# Patient Record
Sex: Male | Born: 1960 | Race: White | Hispanic: No | Marital: Married | Smoking: Former smoker
Health system: Southern US, Community
[De-identification: ages and names within clinical notes are randomized; demographics above are authoritative.]

## PROBLEM LIST (undated history)

## (undated) ENCOUNTER — Emergency Department: Discharge status: Absent without leave | Payer: BC Managed Care – PPO

## (undated) DIAGNOSIS — N200 Calculus of kidney: Secondary | ICD-10-CM

## (undated) DIAGNOSIS — I2510 Atherosclerotic heart disease of native coronary artery without angina pectoris: Secondary | ICD-10-CM

## (undated) DIAGNOSIS — H8110 Benign paroxysmal vertigo, unspecified ear: Secondary | ICD-10-CM

## (undated) DIAGNOSIS — M47816 Spondylosis without myelopathy or radiculopathy, lumbar region: Secondary | ICD-10-CM

## (undated) DIAGNOSIS — E785 Hyperlipidemia, unspecified: Secondary | ICD-10-CM

## (undated) DIAGNOSIS — N529 Male erectile dysfunction, unspecified: Secondary | ICD-10-CM

## (undated) DIAGNOSIS — Z955 Presence of coronary angioplasty implant and graft: Secondary | ICD-10-CM

## (undated) DIAGNOSIS — E349 Endocrine disorder, unspecified: Secondary | ICD-10-CM

## (undated) DIAGNOSIS — I639 Cerebral infarction, unspecified: Principal | ICD-10-CM

## (undated) DIAGNOSIS — N189 Chronic kidney disease, unspecified: Secondary | ICD-10-CM

## (undated) DIAGNOSIS — I10 Essential (primary) hypertension: Secondary | ICD-10-CM

## (undated) DIAGNOSIS — C801 Malignant (primary) neoplasm, unspecified: Secondary | ICD-10-CM

## (undated) DIAGNOSIS — M1990 Unspecified osteoarthritis, unspecified site: Secondary | ICD-10-CM

## (undated) DIAGNOSIS — Z87442 Personal history of urinary calculi: Secondary | ICD-10-CM

## (undated) DIAGNOSIS — E119 Type 2 diabetes mellitus without complications: Secondary | ICD-10-CM

## (undated) DIAGNOSIS — J45909 Unspecified asthma, uncomplicated: Secondary | ICD-10-CM

## (undated) DIAGNOSIS — K219 Gastro-esophageal reflux disease without esophagitis: Secondary | ICD-10-CM

## (undated) DIAGNOSIS — R011 Cardiac murmur, unspecified: Secondary | ICD-10-CM

## (undated) DIAGNOSIS — K5790 Diverticulosis of intestine, part unspecified, without perforation or abscess without bleeding: Secondary | ICD-10-CM

## (undated) DIAGNOSIS — M1712 Unilateral primary osteoarthritis, left knee: Secondary | ICD-10-CM

## (undated) DIAGNOSIS — I213 ST elevation (STEMI) myocardial infarction of unspecified site: Secondary | ICD-10-CM

## (undated) DIAGNOSIS — G4733 Obstructive sleep apnea (adult) (pediatric): Secondary | ICD-10-CM

## (undated) DIAGNOSIS — D649 Anemia, unspecified: Secondary | ICD-10-CM

## (undated) DIAGNOSIS — M2140 Flat foot [pes planus] (acquired), unspecified foot: Secondary | ICD-10-CM

## (undated) DIAGNOSIS — Z85528 Personal history of other malignant neoplasm of kidney: Secondary | ICD-10-CM

## (undated) DIAGNOSIS — N281 Cyst of kidney, acquired: Secondary | ICD-10-CM

## (undated) DIAGNOSIS — Z7982 Long term (current) use of aspirin: Secondary | ICD-10-CM

## (undated) DIAGNOSIS — C649 Malignant neoplasm of unspecified kidney, except renal pelvis: Secondary | ICD-10-CM

## (undated) DIAGNOSIS — F32A Depression, unspecified: Secondary | ICD-10-CM

## (undated) DIAGNOSIS — Z8679 Personal history of other diseases of the circulatory system: Secondary | ICD-10-CM

## (undated) HISTORY — DX: Flat foot (pes planus) (acquired), unspecified foot: M21.40

## (undated) HISTORY — DX: Cerebral infarction, unspecified: I63.9

## (undated) HISTORY — DX: Endocrine disorder, unspecified: E34.9

## (undated) HISTORY — DX: Hyperlipidemia, unspecified: E78.5

## (undated) HISTORY — PX: JOINT REPLACEMENT: SHX530

## (undated) HISTORY — DX: Male erectile dysfunction, unspecified: N52.9

## (undated) HISTORY — DX: Atherosclerotic heart disease of native coronary artery without angina pectoris: I25.10

## (undated) HISTORY — DX: Diverticulosis of intestine, part unspecified, without perforation or abscess without bleeding: K57.90

## (undated) HISTORY — PX: VASECTOMY: SHX75

## (undated) HISTORY — DX: ST elevation (STEMI) myocardial infarction of unspecified site: I21.3

## (undated) HISTORY — DX: Cardiac murmur, unspecified: R01.1

## (undated) HISTORY — DX: Personal history of other malignant neoplasm of kidney: Z85.528

## (undated) HISTORY — PX: TONSILLECTOMY AND ADENOIDECTOMY: SUR1326

## (undated) HISTORY — PX: TESTICLE SURGERY: SHX794

## (undated) HISTORY — DX: Essential (primary) hypertension: I10

## (undated) HISTORY — PX: CARDIAC CATHETERIZATION: SHX172

## (undated) HISTORY — PX: COLON SURGERY: SHX602

---

## 2003-05-28 DIAGNOSIS — E785 Hyperlipidemia, unspecified: Secondary | ICD-10-CM | POA: Insufficient documentation

## 2003-05-29 ENCOUNTER — Encounter: Payer: Self-pay | Admitting: Family Medicine

## 2003-05-29 LAB — CONVERTED CEMR LAB
Blood Glucose, Fasting: 85 mg/dL
WBC, blood: 5.9 10*3/uL

## 2004-06-16 ENCOUNTER — Ambulatory Visit: Payer: Self-pay | Admitting: Family Medicine

## 2005-03-16 ENCOUNTER — Ambulatory Visit: Payer: Self-pay | Admitting: Family Medicine

## 2005-03-16 LAB — CONVERTED CEMR LAB
PSA: 0.44 ng/mL
TSH: 1.89 microintl units/mL

## 2005-03-23 ENCOUNTER — Ambulatory Visit (HOSPITAL_BASED_OUTPATIENT_CLINIC_OR_DEPARTMENT_OTHER): Admission: RE | Admit: 2005-03-23 | Discharge: 2005-03-23 | Payer: Self-pay | Admitting: Family Medicine

## 2005-04-02 ENCOUNTER — Ambulatory Visit: Payer: Self-pay | Admitting: Pulmonary Disease

## 2005-05-06 ENCOUNTER — Ambulatory Visit: Payer: Self-pay | Admitting: Family Medicine

## 2005-06-08 ENCOUNTER — Ambulatory Visit: Payer: Self-pay | Admitting: Family Medicine

## 2006-04-03 ENCOUNTER — Other Ambulatory Visit: Payer: Self-pay

## 2006-04-03 ENCOUNTER — Emergency Department: Payer: Self-pay | Admitting: Emergency Medicine

## 2006-05-06 ENCOUNTER — Ambulatory Visit: Payer: Self-pay | Admitting: Family Medicine

## 2007-10-27 ENCOUNTER — Ambulatory Visit: Payer: Self-pay | Admitting: Family Medicine

## 2007-10-27 DIAGNOSIS — N529 Male erectile dysfunction, unspecified: Secondary | ICD-10-CM | POA: Insufficient documentation

## 2007-11-16 ENCOUNTER — Encounter: Payer: Self-pay | Admitting: Family Medicine

## 2007-11-16 DIAGNOSIS — G4730 Sleep apnea, unspecified: Secondary | ICD-10-CM

## 2007-11-16 DIAGNOSIS — E349 Endocrine disorder, unspecified: Secondary | ICD-10-CM

## 2007-11-16 DIAGNOSIS — I10 Essential (primary) hypertension: Secondary | ICD-10-CM

## 2008-01-30 ENCOUNTER — Ambulatory Visit: Payer: Self-pay | Admitting: Family Medicine

## 2008-01-30 DIAGNOSIS — D485 Neoplasm of uncertain behavior of skin: Secondary | ICD-10-CM

## 2008-01-31 LAB — CONVERTED CEMR LAB
ALT: 29 units/L (ref 0–53)
AST: 24 units/L (ref 0–37)
Alkaline Phosphatase: 76 units/L (ref 39–117)
BUN: 19 mg/dL (ref 6–23)
Basophils Absolute: 0 10*3/uL (ref 0.0–0.1)
Basophils Relative: 0.6 % (ref 0.0–3.0)
CO2: 30 meq/L (ref 19–32)
Chloride: 105 meq/L (ref 96–112)
Creatinine, Ser: 1.2 mg/dL (ref 0.4–1.5)
Eosinophils Relative: 6.4 % — ABNORMAL HIGH (ref 0.0–5.0)
LDL Cholesterol: 123 mg/dL — ABNORMAL HIGH (ref 0–99)
Lymphocytes Relative: 25 % (ref 12.0–46.0)
Neutrophils Relative %: 59.6 % (ref 43.0–77.0)
PSA: 0.53 ng/mL (ref 0.10–4.00)
RBC: 5.17 M/uL (ref 4.22–5.81)
Total Bilirubin: 1.1 mg/dL (ref 0.3–1.2)
VLDL: 27 mg/dL (ref 0–40)
WBC: 6.7 10*3/uL (ref 4.5–10.5)

## 2008-07-17 ENCOUNTER — Ambulatory Visit: Payer: Self-pay | Admitting: Family Medicine

## 2008-08-02 ENCOUNTER — Telehealth: Payer: Self-pay | Admitting: Family Medicine

## 2008-08-03 ENCOUNTER — Ambulatory Visit: Payer: Self-pay | Admitting: Internal Medicine

## 2008-08-15 ENCOUNTER — Telehealth: Payer: Self-pay | Admitting: Internal Medicine

## 2008-08-16 ENCOUNTER — Ambulatory Visit: Payer: Self-pay | Admitting: Internal Medicine

## 2008-11-02 ENCOUNTER — Ambulatory Visit: Payer: Self-pay | Admitting: Family Medicine

## 2008-11-02 DIAGNOSIS — L03019 Cellulitis of unspecified finger: Secondary | ICD-10-CM

## 2009-03-25 ENCOUNTER — Encounter: Payer: Self-pay | Admitting: Family Medicine

## 2009-03-26 ENCOUNTER — Ambulatory Visit: Payer: Self-pay | Admitting: Family Medicine

## 2009-03-26 DIAGNOSIS — R079 Chest pain, unspecified: Secondary | ICD-10-CM | POA: Insufficient documentation

## 2009-05-07 ENCOUNTER — Ambulatory Visit: Payer: Self-pay

## 2009-05-07 ENCOUNTER — Ambulatory Visit: Payer: Self-pay | Admitting: Cardiology

## 2009-05-07 ENCOUNTER — Ambulatory Visit: Payer: Self-pay | Admitting: Family Medicine

## 2009-06-04 ENCOUNTER — Telehealth: Payer: Self-pay | Admitting: Family Medicine

## 2009-06-21 ENCOUNTER — Emergency Department: Payer: Self-pay | Admitting: Emergency Medicine

## 2009-06-22 ENCOUNTER — Emergency Department: Payer: Self-pay | Admitting: Emergency Medicine

## 2009-07-12 ENCOUNTER — Telehealth: Payer: Self-pay | Admitting: Family Medicine

## 2010-01-15 ENCOUNTER — Ambulatory Visit: Payer: Self-pay | Admitting: Family Medicine

## 2010-01-15 DIAGNOSIS — B360 Pityriasis versicolor: Secondary | ICD-10-CM | POA: Insufficient documentation

## 2010-01-15 DIAGNOSIS — H43399 Other vitreous opacities, unspecified eye: Secondary | ICD-10-CM

## 2010-01-20 LAB — CONVERTED CEMR LAB
ALT: 30 units/L (ref 0–53)
Alkaline Phosphatase: 76 units/L (ref 39–117)
Basophils Relative: 0.8 % (ref 0.0–3.0)
Bilirubin, Direct: 0.1 mg/dL (ref 0.0–0.3)
Calcium: 9.4 mg/dL (ref 8.4–10.5)
Eosinophils Relative: 6 % — ABNORMAL HIGH (ref 0.0–5.0)
GFR calc non Af Amer: 68.91 mL/min (ref >60)
Glucose, Bld: 98 mg/dL (ref 70–99)
HDL: 42.3 mg/dL (ref >39.00)
Lymphocytes Relative: 28.5 % (ref 12.0–46.0)
Monocytes Relative: 7.9 % (ref 3.0–12.0)
Neutrophils Relative %: 56.8 % (ref 43.0–77.0)
Phosphorus: 4.1 mg/dL (ref 2.3–4.6)
Platelets: 180 10*3/uL (ref 150.0–400.0)
Potassium: 4.3 meq/L (ref 3.5–5.1)
RBC: 4.9 M/uL (ref 4.22–5.81)
Sodium: 141 meq/L (ref 135–145)
Total Bilirubin: 0.6 mg/dL (ref 0.3–1.2)
Total CHOL/HDL Ratio: 5
Total Protein: 6.9 g/dL (ref 6.0–8.3)
VLDL: 88.2 mg/dL — ABNORMAL HIGH (ref 0.0–40.0)
WBC: 6.9 10*3/uL (ref 4.5–10.5)

## 2010-03-28 ENCOUNTER — Ambulatory Visit: Payer: Self-pay | Admitting: Family Medicine

## 2010-04-30 ENCOUNTER — Ambulatory Visit: Admit: 2010-04-30 | Payer: Self-pay | Admitting: Family Medicine

## 2010-05-01 ENCOUNTER — Encounter (INDEPENDENT_AMBULATORY_CARE_PROVIDER_SITE_OTHER): Payer: Self-pay | Admitting: *Deleted

## 2010-05-07 ENCOUNTER — Ambulatory Visit: Payer: Self-pay | Admitting: Family Medicine

## 2010-05-21 NOTE — Assessment & Plan Note (Signed)
Summary: SKIN PROBLEMS,CHECK BP/CLE   Vital Signs:  Patient profile:   50 year old male Height:      67 inches Weight:      261.25 pounds BMI:     41.07 Temp:     98.4 degrees F oral Pulse rate:   84 / minute Pulse rhythm:   regular BP sitting:   120 / 76  (left arm) Cuff size:   large  Vitals Entered By: Lewanda Rife LPN (January 15, 2010 3:00 PM) CC: Ck BP and skin rash upper torso   History of Present Illness: had flu shot today   bp is needing check/ refils very good  120/76  rash started over 6 months ago -- and seems to be spreading  itches around his neck  hypopigmented areas- not raised   wt is up significantly  walks for exercise- not really regularly  used to go to gym  is not eating right -  ? why  felt better when he had lost wt  not motivated to do it   sometimes eats when he is bored   also having eye problems in R eye -- a pc of something - follows his vision around (Not painful) will rub it and it comes right back overall comes and goes no loss of vision or redness or d/c    Allergies (verified): No Known Drug Allergies  Past History:  Past Medical History: Last updated: 08/19/2008 HTN testosterone def  sleep apnea  hyperlipidemia ED pes planus  family hx colon cancer   ortho -- [facility]  GI- [facility]  Past Surgical History: Last updated: 05/07/2009 tonsillectomy and adneoids :(as child) TESTICLLE SX:( AS CHILD) HEART MURMUR:  ( AS CHILD) ASTHMA : (AS CHILD) SLEEP STUDY : APNEA--CPAP:(03/2005) colonoscopy 4/10 - diverticulosis- re check 5 y (due to fam hx) 1/11 normal exercise stress test   Family History: Last updated: 03/26/2009 Father:   HEART PROBLEMS//  Mother: DECEASED AT 51 YOA ; + HTN: +MI at age 78 Siblings:  sister with colon cancer  CV: + FATHER; + MOTHER HBP: + FATHER; + MOTHER DM: NEGATIVE GOUT/ARTHRITIS: PROSTATE CANCER: NEGATIVE BREAST/OVARIAN/UTERINE CANCER: NEGATIVE COLON CANCER:  NEGATIVE DEPRESSION: ? 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 Eyes:  Complains of eye irritation and itching; denies blurring, red eye, and vision loss-1 eye. CV:  Denies chest pain or discomfort, lightheadness, and palpitations. Resp:  Denies cough, shortness of breath, and wheezing. GI:  Denies abdominal pain, change in bowel habits, and nausea. GU:  Denies urinary frequency. MS:  Denies joint pain. Derm:  Complains of itching and rash; denies lesion(s) and poor wound healing. Neuro:  Denies headaches, numbness, and tingling. Psych:  Denies anxiety and depression. Endo:  Denies cold intolerance, excessive thirst, excessive urination, and heat intolerance. Heme:  Denies abnormal bruising and bleeding.  Physical Exam  General:  overweight but generally well appearing  Head:  normocephalic, atraumatic, and no abnormalities observed.   Eyes:  vision grossly intact, pupils equal, pupils round, pupils reactive to light, and no injection.   Mouth:  pharynx pink and moist.   Neck:  supple with full rom and no masses or thyromegally,  no JVD or carotid bruit  Chest Wall:  No deformities, masses, tenderness or gynecomastia noted. Lungs:  Normal respiratory effort, chest expands symmetrically. Lungs are clear to auscultation, no crackles or wheezes. Heart:  Normal rate and regular rhythm. S1 and S2 normal without gallop, murmur, click, rub or other extra sounds. Abdomen:  Bowel sounds positive,abdomen soft and non-tender without masses, organomegaly or hernias noted. no renal bruits  Msk:  No deformity or scoliosis noted of thoracic or lumbar  spine.  no renal bruits  Pulses:  R and L carotid,radial,femoral,dorsalis pedis and posterior tibial pulses are full and equal bilaterally Extremities:  No clubbing, cyanosis, edema, or deformity noted with normal full range of motion of all joints.   Neurologic:  sensation intact to light touch, gait normal, and DTRs symmetrical and normal.   Skin:  rash over neck and upper body pink/ hypopigmented oval lesions very slt raised with mild scale no excoriation  some skin tags Cervical Nodes:  No lymphadenopathy noted Inguinal Nodes:  No significant adenopathy Psych:  normal affect, talkative and pleasant    Impression & Recommendations:  Problem # 1:  HYPERTENSION, CONTROLLED (ICD-401.1) Assessment Unchanged  well controlled with ace disc healthy diet (low simple sugar/ choose complex carbs/ low sat fat) diet and exercise in detail  needs to work on getting wt back down  lab today His updated medication list for this problem includes:    Lisinopril 10 Mg Tabs (Lisinopril) .Marland Kitchen... Take 1 tablet by mouth once a day  Orders: Venipuncture (04540) TLB-Lipid Panel (80061-LIPID) TLB-Renal Function Panel (80069-RENAL) TLB-CBC Platelet - w/Differential (85025-CBCD) TLB-Hepatic/Liver Function Pnl (80076-HEPATIC) TLB-TSH (Thyroid Stimulating Hormone) (84443-TSH) Prescription Created Electronically [phone])  BP today: 120/76 Prior BP: 147/79 (05/07/2009)  Prior 10 Yr Risk Heart Disease: 11 % (05/07/2009)  Labs Reviewed: K+: 4.5 (01/30/2008) Creat: : 1.2 (01/30/2008)   Chol: 190 (01/30/2008)   HDL: 39.5 (01/30/2008)   LDL: 123 (01/30/2008)   TG: 136 (01/30/2008)  Problem # 2:  HYPERLIPIDEMIA (ICD-272.4) Assessment: Unchanged  expect worse with poor diet rev low sat fat diet lab today and update Orders: Venipuncture (14782) TLB-Lipid Panel (80061-LIPID) TLB-Renal Function Panel (80069-RENAL) TLB-CBC Platelet - w/Differential (85025-CBCD) TLB-Hepatic/Liver Function Pnl  (80076-HEPATIC) TLB-TSH (Thyroid Stimulating Hormone) (95621-HYQ) Prescription Created Electronically [id number])  Labs Reviewed: SGOT: 24 (01/30/2008)   SGPT: 29 (01/30/2008)  Prior 10 Yr Risk Heart Disease: 11 % (05/07/2009)   HDL:39.5 (01/30/2008)  LDL:123 (01/30/2008)  Chol:190 (01/30/2008)  Trig:136 (01/30/2008)  Problem # 3:  TINEA VERSICOLOR (ICD-111.0) Assessment: New  on chest and neck and back will tx with ketoconazole shampoo and update if not imp  adv to keep area as dry as possible His updated medication list for this problem includes:    Nizoral 2 % Sham (Ketoconazole) .Marland Kitchen... Lather affected areas twice weekly - let sit 5 minutes and rinse  Orders: Prescription Created Electronically [phone])  Problem # 4:  EYE FLOATERS (ICD-379.24) Assessment: New new R eye floaters ref to opthy Orders: Ophthalmology Referral (Ophthalmology) Prescription Created Electronically [phone])  Complete Medication List: 1)  Lisinopril 10 Mg Tabs (Lisinopril) .... Take 1 tablet by mouth once a day 2)  Aspirin 81 Mg Tabs (Aspirin) .... Take 1 tablet by mouth once a day 3)  Ibuprofen 200 Mg Tabs (Ibuprofen) .... As needed but usually on a daily basis. 4)  Omeprazole 20 Mg Cpdr (Omeprazole) .Marland Kitchen.. 1 by mouth once daily in am as needed 5)  Levitra 20 Mg Tabs (Vardenafil hcl) .Marland Kitchen.. 1 by mouth  once daily as needed 6)  Nizoral 2 % Sham (Ketoconazole) .... Lather affected areas twice weekly - let sit 5 minutes and rinse  Other Orders: Admin 1st Vaccine (44010) Flu Vaccine 50yrs + (27253)  Patient Instructions: 1)  work on weight loss again  2)  you can raise your HDL (good cholesterol) by increasing exercise and eating omega 3 fatty acid supplement like fish oil or flax seed oil over the counter 3)  you can lower LDL (bad cholesterol) by limiting saturated fats in diet like red meat, fried foods, egg yolks, fatty breakfast meats, high fat dairy products and shellfish  4)  blood pressure is good- no  change in medicine  5)  labs today 6)  use the nizoral shampoo for rash and update me if not improved in 1 month  7)  we will do eye doctor ref at check out  Prescriptions: NIZORAL 2 % SHAM (KETOCONAZOLE) lather affected areas twice weekly - let sit 5 minutes and rinse  #1 medium x 1   Entered and Authorized by:   Judith Part MD   Signed by:   Judith Part MD on 01/15/2010   Method used:   Electronically to        [facility] [address] (retail)       [address], [address]       [place], [place]  [address]       Ph: [phone]       Fax: [phone]   RxID:   [id number] LISINOPRIL 10 MG  TABS (LISINOPRIL) Take 1 tablet by mouth once a day  #30 x 11   Entered and Authorized by:   Judith Part MD   Signed by:   Judith Part MD on 01/15/2010   Method used:   Electronically to        [facility] [address] (retail)       [address], [address]       [place], [place]  [address]       Ph: [phone]       Fax: [phone]   RxID:   [id number]   Current Allergies (reviewed today): No known allergies      Flu Vaccine Consent Questions     Do you have a history of severe allergic reactions to this vaccine? no    Any prior history of allergic reactions to egg and/or gelatin? no    Do you have a sensitivity to the preservative Thimersol? no    Do you have a past history of Guillan-Barre Syndrome? no    Do you currently have an acute febrile illness? no    Have you ever had a severe reaction to latex? no    Vaccine information given and explained to patient? yes    Are you currently pregnant? no    Lot Number:[id number]   Exp Date:10/18/2010   Site Given  Left Deltoid IMbflu Lewanda Rife LPN  January 15, 2010 3:07 PM

## 2010-05-21 NOTE — Progress Notes (Signed)
Summary: refill request for levitra  Phone Note Call from Patient   Caller: Spouse [phone] Call For: Jacob Marks Summary of Call: Pt is asking for levitra 20 mg's to be called to [facility] [address], he is requesting # 8 at a time.  This is no longer on med list. Initial call taken by: Lowella Petties CMA,  June 04, 2009 4:47 PM  Follow-up for Phone Call        px written on EMR for call in  Follow-up by: Jacob Marks,  June 04, 2009 5:36 PM  Additional Follow-up for Phone Call Additional follow up Details #1::        spouse advised and rx called in.Consuello Masse CMA  Additional Follow-up by: Benny Lennert CMA Duncan Dull),  June 05, 2009 10:50 AM    New/Updated Medications: LEVITRA 20 MG TABS (VARDENAFIL HCL) 1 by mouth once daily Prescriptions: LEVITRA 20 MG TABS (VARDENAFIL HCL) 1 by mouth once daily  #8 x 11   Entered and Authorized by:   Jacob Marks   Signed by:   Benny Lennert CMA (AAMA) on 06/05/2009   Method used:   Telephoned to ...       [facility] [address] ([facility]ail)       [address]       [place], [place]  [address]       Ph: [phone]       Fax: [phone]   RxID:   [id number]

## 2010-05-21 NOTE — Progress Notes (Signed)
Summary: prior Berkley Harvey is needed for omeprazole  Phone Note From Pharmacy   Caller: [phone] Summary of Call: Prior Berkley Harvey is needed for omeprazole, form is on your shelf. Initial call taken by: Lowella Petties CMA,  July 12, 2009 9:46 AM  Follow-up for Phone Call        form done and in nurse in box  Follow-up by: Judith Part MD,  July 12, 2009 11:59 AM  Additional Follow-up for Phone Call Additional follow up Details #1::        Form faxed to [facility] at [phone]. Additional Follow-up by: Linde Gillis CMA Duncan Dull),  July 12, 2009 12:44 PM        Appended Document: prior Berkley Harvey is needed for omeprazole Received PA Approval for Omeprazole.  Approved from 06/21/2009 until 04/19/2098.  Pharmacy and patient notified.

## 2010-05-22 NOTE — Letter (Signed)
Summary: [place] No Show Letter  [place] [facility]  [address] [place], [place] [address]   Phone: [phone]  Fax: [phone]    05/01/2010 MRN: [id number]  Jacob Marks [address] [place], [place]  [address]   Dear Mr. ALARIE,   Our records indicate that you missed your scheduled appointment with ____Laboratory_________________ on _1.11.2012__________.  Please contact this office to reschedule your appointment as soon as possible.  It is important that you keep your scheduled appointments with your physician, so we can provide you the best care possible.  Please be advised that there may be a charge for "no show" appointments.    Sincerely,   [place] [facility]

## 2010-07-31 ENCOUNTER — Encounter: Payer: Self-pay | Admitting: Family Medicine

## 2010-08-05 ENCOUNTER — Encounter: Payer: Self-pay | Admitting: Family Medicine

## 2010-08-05 ENCOUNTER — Ambulatory Visit (INDEPENDENT_AMBULATORY_CARE_PROVIDER_SITE_OTHER): Payer: BC Managed Care – PPO | Admitting: Family Medicine

## 2010-08-05 DIAGNOSIS — Z125 Encounter for screening for malignant neoplasm of prostate: Secondary | ICD-10-CM

## 2010-08-05 DIAGNOSIS — I10 Essential (primary) hypertension: Secondary | ICD-10-CM

## 2010-08-05 DIAGNOSIS — E291 Testicular hypofunction: Secondary | ICD-10-CM

## 2010-08-05 DIAGNOSIS — G4730 Sleep apnea, unspecified: Secondary | ICD-10-CM

## 2010-08-05 DIAGNOSIS — R5383 Other fatigue: Secondary | ICD-10-CM | POA: Insufficient documentation

## 2010-08-05 DIAGNOSIS — N529 Male erectile dysfunction, unspecified: Secondary | ICD-10-CM

## 2010-08-05 LAB — PSA: PSA: 0.62 ng/mL (ref 0.10–4.00)

## 2010-08-05 NOTE — Assessment & Plan Note (Signed)
psa today No symptoms May consider testosterone repl if low  Disc risks of this in light of prostate ca

## 2010-08-05 NOTE — Assessment & Plan Note (Signed)
Intol to levitra and viagra  Consider urol ref to disc other opt Testosterone level today

## 2010-08-05 NOTE — Assessment & Plan Note (Signed)
Stable on ace Enc further wt loss and more exercise

## 2010-08-05 NOTE — Assessment & Plan Note (Signed)
This could add to fatigue and ED Disc other risks Need to make effort to change cpap to more comfortable set up?

## 2010-08-05 NOTE — Progress Notes (Signed)
Subjective:    Patient ID: Jacob Marks, male    DOB: 10/08/1960, 50 y.o.   MRN: [id number]  HPI Here for f/u of testosterone deficiency and HTN Wt is down 11 lb by our scales   Thinks he has low testosterone level Symptoms have become worse  Always tired in general  Libido is the same but ED if more of a problem  Does have erectile dysfunction-- has had levitra-- but has side eff of headache and flushing and congestion -- even on 1/2 pill  viagra worse side effects   Has sleep apnea and is not using a cpap at this time -- too difficult because could not roll on his side  Did not gain sleep on it because uncomfortable   Trouble both keeping and getting erection   Has never seen urologist  No trouble urinating  2 times nocturia since childhood No prostate cancer in family    HTN is fairly controlled 132/84 with ace  Takes ibuprofen prn   Exercise= just yardwork   Past Medical History  Diagnosis Date  . Hypertension   . Hyperlipidemia   . Testosterone deficiency   . Sleep apnea   . ED (erectile dysfunction)   . Pes planus   . Heart murmur     as a child  . Asthma     as a child   Past Surgical History  Procedure Date  . Tonsillectomy and adenoidectomy     as a child  . Testicle surgery     as a child    reports that he quit smoking about 32 years ago. He does not have any smokeless tobacco history on file. He reports that he drinks alcohol. His drug history not on file. family history includes Alcohol abuse in his mother; Cancer in his sister; Heart disease in his father; Heart disease (age of onset:36) in his mother; and Hypertension in his father and mother. Allergies  Allergen Reactions  . Viagra   . Testosterone Rash    Rash from the patch       Review of Systems  Constitutional: Positive for fatigue. Negative for activity change, appetite change and unexpected weight change.  Eyes: Negative for pain.  Respiratory: Negative for shortness of breath  and wheezing.   Cardiovascular: Negative.   Gastrointestinal: Negative for nausea, abdominal pain, diarrhea and constipation.  Genitourinary: Negative for dysuria, urgency, frequency, flank pain, difficulty urinating, penile pain and testicular pain.  Musculoskeletal: Negative for myalgias and back pain.  Skin: Negative.   Neurological: Negative for tremors, numbness and headaches.  Hematological: Negative for adenopathy. Does not bruise/bleed easily.  Psychiatric/Behavioral: Negative for confusion, dysphoric mood and decreased concentration. The patient is not nervous/anxious.        Objective:   Physical Exam  Constitutional: He appears well-developed and well-nourished.       overwt and well appearing   HENT:  Head: Normocephalic and atraumatic.  Eyes: Conjunctivae and EOM are normal. Pupils are equal, round, and reactive to light.  Neck: Normal range of motion. Neck supple. No JVD present. Carotid bruit is not present. No thyromegaly present.  Cardiovascular: Normal rate, regular rhythm and normal heart sounds.   Pulmonary/Chest: Effort normal and breath sounds normal.  Abdominal: Soft. Bowel sounds are normal. He exhibits no mass.  Musculoskeletal: He exhibits no edema and no tenderness.  Lymphadenopathy:    He has no cervical adenopathy.  Neurological: He is alert. He has normal reflexes. Coordination normal.  Skin: Skin is  warm and dry. No rash noted. No erythema.  Psychiatric: He has a normal mood and affect.          Assessment & Plan:

## 2010-08-05 NOTE — Patient Instructions (Signed)
Testosterone level today Will update you with results  I am considering a referral to urology Keep working on weight loss and exercise

## 2010-08-05 NOTE — Assessment & Plan Note (Signed)
Checking testosterone level ? From sleep apnea

## 2010-08-05 NOTE — Assessment & Plan Note (Signed)
Checking levels today Some fatigue but no real dec in drive Some ED In past did not tolerate testosterone patch-- rash at patch site Consider urology ref

## 2010-08-06 LAB — TESTOSTERONE, FREE, TOTAL, SHBG
Testosterone, Free: 84.5 pg/mL (ref 47.0–244.0)
Testosterone-% Free: 2.5 % (ref 1.6–2.9)

## 2010-08-14 NOTE — Progress Notes (Signed)
System would not let me do result note. Copy of visit and lab faxed to Dr Achilles Dunk [phone] as instructed.

## 2010-09-03 ENCOUNTER — Inpatient Hospital Stay: Payer: Self-pay | Admitting: Surgery

## 2010-09-05 NOTE — Procedures (Signed)
Jacob Marks, Jacob Marks NO.:  [id number]   MEDICAL RECORD NO.:  [id number]          PATIENT TYPE:  OUT   LOCATION:  SLEEP CENTER                 FACILITY:  [facility]   PHYSICIAN:  Marcelyn Bruins, M.D. [facility] DATE OF BIRTH:  1960/05/17   DATE OF STUDY:  03/23/2005                              NOCTURNAL POLYSOMNOGRAM   REFERRING PHYSICIAN:  Dr. Roxy Manns.   DATE OF STUDY:  March 23, 2005.   INDICATION FOR THE STUDY:  Hypersomnia with sleep apnea.   EPWORTH SCORE:  15.   SLEEP ARCHITECTURE:  The patient had a total sleep time of 358 minutes with  adequate REM but never achieved slow wave sleep. Sleep onset latency was  normal and REM onset was at the upper limits of normal. Sleep efficiency was  reduced at 88%.   RESPIRATORY DATA:  The patient underwent split-night study where he was  found to have 60 obstructive events in the first 141 minutes of sleep. This  gave the patient a Respiratory Disturbance Index of 26 events. The patient  was placed on a medium Respironics Comfort nasal mask and CPAP titration was  initiated. At a final pressure of 11 cm H2O there was adequate control of  both events and snoring.   OXYGEN DATA:  The patient had O2 desaturation transiently to 89% during the  study.   CARDIAC DATA:  No clinically significant cardiac arrhythmias.   MOVEMENT/PARASOMNIA:  The patient was found to have very large numbers of  leg jerks with significant sleep disruption. There were 179 jerks with 4.5  per hour resulting in arousal or awakening. However, it should be noted as  the patient scoot upwards and there was better control of his events, the  leg movements became fairly infrequent. Probably with restless leg syndrome.   IMPRESSION/RECOMMENDATION:  1.  Moderate obstructive sleep apnea with a Respiratory Disturbance Index of      26 per hour before continuous positive airway pressure initiation. The      patient was then placed on continuous positive  airway pressure and      titrated to a level of 11 cm with excellent control of obstructive      events.  2.  Large numbers of leg jerks with significant sleep disruption. These      improved as the patient   Dictation ended at this point.           ______________________________  Marcelyn Bruins, M.D. [facility]  Diplomate, American Board of Sleep  Medicine     KC/MEDQ  D:  03/31/2005 16:24:06  T:  04/01/2005 19:49:51  Job:  [id number]

## 2010-10-13 ENCOUNTER — Ambulatory Visit: Payer: Self-pay | Admitting: Vascular Surgery

## 2010-10-19 DIAGNOSIS — K5792 Diverticulitis of intestine, part unspecified, without perforation or abscess without bleeding: Secondary | ICD-10-CM

## 2010-10-19 DIAGNOSIS — I639 Cerebral infarction, unspecified: Secondary | ICD-10-CM

## 2010-10-19 DIAGNOSIS — K5790 Diverticulosis of intestine, part unspecified, without perforation or abscess without bleeding: Secondary | ICD-10-CM

## 2010-10-19 HISTORY — PX: HEMICOLECTOMY: SHX854

## 2010-10-19 HISTORY — DX: Cerebral infarction, unspecified: I63.9

## 2010-10-19 HISTORY — DX: Diverticulosis of intestine, part unspecified, without perforation or abscess without bleeding: K57.90

## 2010-10-19 HISTORY — PX: ILEOSTOMY: SHX1783

## 2010-10-19 HISTORY — DX: Diverticulitis of intestine, part unspecified, without perforation or abscess without bleeding: K57.92

## 2010-10-20 ENCOUNTER — Inpatient Hospital Stay: Payer: Self-pay | Admitting: Vascular Surgery

## 2010-10-23 LAB — PATHOLOGY REPORT

## 2010-11-04 LAB — PATHOLOGY REPORT

## 2010-11-17 ENCOUNTER — Inpatient Hospital Stay: Payer: Self-pay | Admitting: Vascular Surgery

## 2010-11-19 HISTORY — PX: OTHER SURGICAL HISTORY: SHX169

## 2010-11-24 ENCOUNTER — Ambulatory Visit: Payer: Self-pay | Admitting: Vascular Surgery

## 2010-11-25 ENCOUNTER — Ambulatory Visit: Payer: Self-pay | Admitting: Vascular Surgery

## 2010-12-08 ENCOUNTER — Ambulatory Visit: Payer: Self-pay | Admitting: Vascular Surgery

## 2011-01-02 ENCOUNTER — Encounter: Payer: Self-pay | Admitting: Family Medicine

## 2011-01-02 ENCOUNTER — Ambulatory Visit (INDEPENDENT_AMBULATORY_CARE_PROVIDER_SITE_OTHER): Payer: BC Managed Care – PPO | Admitting: Family Medicine

## 2011-01-02 VITALS — BP 124/62 | HR 91 | Temp 97.9°F | Wt 214.0 lb

## 2011-01-02 DIAGNOSIS — I6350 Cerebral infarction due to unspecified occlusion or stenosis of unspecified cerebral artery: Secondary | ICD-10-CM

## 2011-01-02 DIAGNOSIS — I10 Essential (primary) hypertension: Secondary | ICD-10-CM

## 2011-01-02 DIAGNOSIS — K5790 Diverticulosis of intestine, part unspecified, without perforation or abscess without bleeding: Secondary | ICD-10-CM

## 2011-01-02 DIAGNOSIS — T8149XA Infection following a procedure, other surgical site, initial encounter: Secondary | ICD-10-CM

## 2011-01-02 DIAGNOSIS — D649 Anemia, unspecified: Secondary | ICD-10-CM

## 2011-01-02 DIAGNOSIS — F329 Major depressive disorder, single episode, unspecified: Secondary | ICD-10-CM

## 2011-01-02 DIAGNOSIS — R5383 Other fatigue: Secondary | ICD-10-CM

## 2011-01-02 DIAGNOSIS — R5381 Other malaise: Secondary | ICD-10-CM

## 2011-01-02 DIAGNOSIS — I639 Cerebral infarction, unspecified: Secondary | ICD-10-CM | POA: Insufficient documentation

## 2011-01-02 DIAGNOSIS — K5730 Diverticulosis of large intestine without perforation or abscess without bleeding: Secondary | ICD-10-CM

## 2011-01-02 DIAGNOSIS — T8140XA Infection following a procedure, unspecified, initial encounter: Secondary | ICD-10-CM

## 2011-01-02 MED ORDER — BUPROPION HCL ER (XL) 150 MG PO TB24: 150.0000 mg | ORAL_TABLET | ORAL | Status: DC

## 2011-01-02 MED ORDER — ASPIRIN-DIPYRIDAMOLE ER 25-200 MG PO CP12: 1.0000 | ORAL_CAPSULE | Freq: Every day | ORAL | Status: DC

## 2011-01-02 MED ORDER — ZOLPIDEM TARTRATE 10 MG PO TABS: 10.0000 mg | ORAL_TABLET | Freq: Every evening | ORAL | Status: DC | PRN

## 2011-01-02 NOTE — Progress Notes (Signed)
Subjective:    Patient ID: Jacob Marks, male    DOB: [birth year], 50 y.o.   MRN: [id number]  HPI Here for f/u of several hospitalizations  Pt began with hemicolectomy due to diverticulosis  After that had complication of leaking  anastamosis  Also acute cva while in the hospital - with speech slurring , then hematoma after lovenox  Last surgery resulted in iliostomy and also wound abcess (that had to be operated on again ) cx grew out regular staph that was not mrsa - and pan sensitive to everything (this was recent) Surgeon is working with that  Will be starting augmentin bid   Is also on lomotil   In rehab facility with wound vac  Also Picc line -- just took that out   Was seen subsequently for failure to thrive and dehydration -- from high output iliostomy He has had a loss of 36 lb since his last visit here (40 lb from home )   Wife thinks he is depressed   Also quite a bit of nausea -- taking zofran  ? From his pain med  Has to avoid raw foods at this point  Appetite is bad   Pain med is coming from surgeon - percocet 7.5  Uses ambien to sleep  Is having constant soreness in lower legs to knee -- ? What K is  Venous doppler on legs was fine - after his stroke  No swelling or redness or lumps   Is having some numbness - tingling - over R half of face and nose  No rash at all  Hands seem pale  Had to give 2 u of blood in hosp - lowest hb 87  Some depression symptoms - wife claims he cries all the time  Is generally sad from sitting in the house all the time  A PA gave him prozac -- made him quite dizzy / swimmy headed (at the same time he was dehydrated)  Does not have any anxiety  No suicidal thoughts  Patient Active Problem List  Diagnoses  . TINEA VERSICOLOR  . NEOPLASM OF UNCERTAIN BEHAVIOR OF SKIN  . TESTOSTERONE DEFICIENCY  . HYPERLIPIDEMIA  . EYE FLOATERS  . HYPERTENSION, CONTROLLED  . IMPOTENCE, ORGANIC ORIGIN  . ONYCHIA AND PARONYCHIA OF FINGER    . SLEEP APNEA  . Fatigue  . Prostate cancer screening  . CVA (cerebral infarction)  . Depression  . Anemia  . Diverticulosis   Past Medical History  Diagnosis Date  . Hypertension   . Hyperlipidemia   . Testosterone deficiency   . Sleep apnea   . ED (erectile dysfunction)   . Pes planus   . Heart murmur     as a child  . Asthma     as a child  . Diverticulosis 7/12    with hemicolectomy-- complications   . CVA (cerebral infarction) 7/12    after his hemicolectomy   Past Surgical History  Procedure Date  . Tonsillectomy and adenoidectomy     as a child  . Testicle surgery     as a child  . Hemicolectomy 7/12    for diverticulosis, complic by leaking anastamosis/ abcess/ addn surg and iliostomy   . Carotid dopplers 8/12    normal - after cva    History  Substance Use Topics  . Smoking status: Former Smoker    Quit date: 04/20/1978  . Smokeless tobacco: Not on file  . Alcohol Use: Yes  2 drinks per week   Family History  Problem Relation Age of Onset  . Heart disease Mother 74    MI  . Hypertension Mother   . Alcohol abuse Mother   . Heart disease Father   . Hypertension Father   . Cancer Sister     colon   Allergies  Allergen Reactions  . Viagra   . Testosterone Rash    Rash from the patch   No current outpatient prescriptions on file prior to visit.           Review of Systems Review of Systems  Constitutional: Negative for fever, appetite change and pos for fatigue and wt loss  Eyes: Negative for pain and visual disturbance.  Respiratory: Negative for cough and shortness of breath.   Cardiovascular: Negative for cp or palpitations    Gastrointestinal: Negative for nausea, diarrhea and constipation.  Genitourinary: Negative for urgency and frequency.  Skin: Negative for pallor or rash   Neurological: Negative for weakness, light-headedness, and headaches. pos for some speech difficulties that are mild / and paresthesias over face   Hematological: Negative for adenopathy. Does not bruise easily on blood thinner.  Psychiatric/Behavioral: Negative for dysphoric mood. The patient is not nervous/anxious.          Objective:   Physical Exam  Constitutional: He is oriented to person, place, and time. He appears well-developed and well-nourished. No distress.       Wt loss noted Overall well appearing   HENT:  Head: Normocephalic and atraumatic.  Right Ear: External ear normal.  Left Ear: External ear normal.  Nose: Nose normal.  Mouth/Throat: Oropharynx is clear and moist.  Eyes: Conjunctivae and EOM are normal. Pupils are equal, round, and reactive to light. Right eye exhibits no discharge. Left eye exhibits no discharge. No scleral icterus.  Neck: Normal range of motion. Neck supple. No JVD present. Carotid bruit is not present. No thyromegaly present.  Cardiovascular: Normal rate, regular rhythm, normal heart sounds and intact distal pulses.  Exam reveals no gallop and no friction rub.   No murmur heard. Pulmonary/Chest: Effort normal and breath sounds normal. No respiratory distress. He has no wheezes.  Abdominal: Soft. Bowel sounds are normal. He exhibits no distension and no mass. There is no tenderness.       Healed incisions nontender  Wound vac in place Bandage not removed   Musculoskeletal: Normal range of motion. He exhibits no edema and no tenderness.  Lymphadenopathy:    He has no cervical adenopathy.  Neurological: He is alert and oriented to person, place, and time. He has normal strength and normal reflexes. He displays no atrophy and no tremor. A sensory deficit is present. No cranial nerve deficit. He exhibits normal muscle tone. He displays a negative Romberg sign. Coordination and gait normal.       slt decreased sensation to light touch on R face and R UE  Skin: Skin is warm and dry. No rash noted. No erythema. There is pallor.       Mild pallor when compared to baseline- especially in palms   Psychiatric: He has a normal mood and affect.          Assessment & Plan:

## 2011-01-02 NOTE — Patient Instructions (Signed)
We will refer you to neurology at check out  Keep follow ups with your surgeon  Start wellbutrin 150mg  xl once daily for depression - update me if worse or any side effects  Let me know if you are interested in a counseling referral  Labs today  Follow up with me in 6-8 weeks

## 2011-01-03 LAB — COMPREHENSIVE METABOLIC PANEL
ALT: 14 U/L (ref 0–53)
AST: 15 U/L (ref 0–37)
Alkaline Phosphatase: 111 U/L (ref 39–117)
Glucose, Bld: 88 mg/dL (ref 70–99)
Sodium: 139 mEq/L (ref 135–145)
Total Bilirubin: 0.4 mg/dL (ref 0.3–1.2)
Total Protein: 6.7 g/dL (ref 6.0–8.3)

## 2011-01-03 LAB — CBC WITH DIFFERENTIAL/PLATELET
Basophils Absolute: 0 10*3/uL (ref 0.0–0.1)
Basophils Relative: 0 % (ref 0–1)
Eosinophils Absolute: 0.7 10*3/uL (ref 0.0–0.7)
MCH: 26.9 pg (ref 26.0–34.0)
MCHC: 31.5 g/dL (ref 30.0–36.0)
Neutrophils Relative %: 67 % (ref 43–77)
Platelets: 231 10*3/uL (ref 150–400)

## 2011-01-03 LAB — VITAMIN B12: Vitamin B-12: 954 pg/mL — ABNORMAL HIGH (ref 211–911)

## 2011-01-04 DIAGNOSIS — K5790 Diverticulosis of intestine, part unspecified, without perforation or abscess without bleeding: Secondary | ICD-10-CM | POA: Insufficient documentation

## 2011-01-04 DIAGNOSIS — T8149XA Infection following a procedure, other surgical site, initial encounter: Secondary | ICD-10-CM | POA: Insufficient documentation

## 2011-01-04 NOTE — Assessment & Plan Note (Signed)
Since multiple surgeries and with depression and anemia with transfusions Suspect multifactorial - will check labs

## 2011-01-04 NOTE — Assessment & Plan Note (Signed)
Pt to start augmentin now after surgical visit  Continues wound vac and f/u and overall doing well

## 2011-01-04 NOTE — Assessment & Plan Note (Signed)
After surgery and blood tranfusions Still tired and with some pallor  Lab for cbc today Rev hosp records in detail

## 2011-01-04 NOTE — Assessment & Plan Note (Signed)
S/p multiple surgeries and now disability as well as cva Did not tol prozac - few days  No anx  Will try wellbutrin 150 xl daily- update if side eff or if worse or SI Urged strongly to consider counseling and will think about it  F/u planned

## 2011-01-04 NOTE — Assessment & Plan Note (Signed)
Very slt speech deficit residual- seems nl today Some dec sensation on R  On aggrenox just once per day and plan was to transition to full dose asa Due to complications, however will ref to neuro for further eval  hosp records rev in detail Neg carotids No hx of afib- pt thinks he had echo in hospital (? Not in records)

## 2011-01-04 NOTE — Assessment & Plan Note (Addendum)
S/p several surgeries and complications including wound abcess- now with wound vac and ongoing surgical f/u hosp rec reviewed Has had episodes of dehydration and failure to thrive from high output iliostomy  For re- anastamosis - rev of ostomy in winter if all heals Now going to [facility]

## 2011-01-04 NOTE — Assessment & Plan Note (Signed)
This remains in good control currently without med after 40 lb wt loss Will continue to monitor

## 2011-01-05 ENCOUNTER — Telehealth: Payer: Self-pay

## 2011-01-05 NOTE — Telephone Encounter (Signed)
Opened phone note to update pt's med list. Ferrous sulfate added.Patient notified as instructed by telephone. (see result note).

## 2011-01-05 NOTE — Telephone Encounter (Signed)
Message copied by Patience Musca on Mon Jan 05, 2011  6:08 PM ------      Message from: Roxy Manns A      Created: Sun Jan 04, 2011 10:52 AM       Some anemia but not  Severe      I recommend ferrous sulfate 325 mg one pill daily over the counter       B12 and electrolytes are ok - that is reassuring

## 2011-01-09 ENCOUNTER — Ambulatory Visit (INDEPENDENT_AMBULATORY_CARE_PROVIDER_SITE_OTHER): Payer: BC Managed Care – PPO | Admitting: Neurology

## 2011-01-09 ENCOUNTER — Encounter: Payer: Self-pay | Admitting: Neurology

## 2011-01-09 VITALS — BP 128/78 | HR 104 | Wt 211.0 lb

## 2011-01-09 DIAGNOSIS — I639 Cerebral infarction, unspecified: Secondary | ICD-10-CM

## 2011-01-09 DIAGNOSIS — I6350 Cerebral infarction due to unspecified occlusion or stenosis of unspecified cerebral artery: Secondary | ICD-10-CM

## 2011-01-09 NOTE — Progress Notes (Signed)
Dear Dr. Milinda Marks,  Thank you for having me see Jacob Marks in consultation today at [facility] for his problem with ischemic stroke.  As you may recall, he is a 50 y.o. year old male with a history of a recent hemicolectomy in July.  On July 8, during hospitalization,  he developed the acute onset of left sided weakness, dysphasia and dysarthria.  An initial CT was negative for hemorrhage.  A CT head reportedly the next day revealed a left hemispheric infarct.  Carotid U/S and echocardiogram were unremarkable, although I don't have the reports.  He was placed on Aggrenox and Lovenox at the time.  Notably he had been off his baseline baby aspirin for 6 days before then.    His speech is not back to baseline, still having problems saying words.  Motor strength is normal.  He is having problems with dizziness(lightheadedness) and was having problems during our visit.  Notably he is still having fevers.  He still has an open abdomen.  He is due for a reversal of his ileostomy later this year.  He did have blood cultures during hospitalization but not immediately after his stroke which were negative.  He also had a "racing heart" during his hospitalization.  Medical History: HTN, diverticulitis, borderline dyslipidemia, skin cancer   Surgical History: Hemicolectomy with ileostomy for diverticulitis.   Social History: social EtOH, no tob.  Family History: No strokes or heart attacks before the age of 52 in either parents.   ROS:  13 systems were reviewed and are notable for dizziness.  Also has abdominal pain on regular oxycodone.  All other review of systems are unremarkable.   Examination:  Filed Vitals:   01/09/11 1017  BP: 128/78  Pulse: 104  Weight: 211 lb (95.709 kg)     In general, man who appears slightly ill.  Cardiovascular: The patient has a sinus tachycardia and no carotid bruits.  Fundoscopy:  Disks are flat. Vessel caliber within normal limits.  Mental status:     The patient is oriented to person, place and time. Recent and remote memory are intact. Attention span and concentration are normal. Language including repetition, naming, following commands are intact. Fund of knowledge of current and historical events, as well as vocabulary are normal.  Cranial Nerves: Pupils are equally round and reactive to light. Visual fields full to confrontation. Extraocular movements are intact without nystagmus. Facial sensation and muscles of mastication are intact. Muscles of facial expression are symmetric. Hearing intact to bilateral finger rub. Palatal elevation on the right is decreased, tongue deviates right. Shoulder shrug intact  Motor:  The patient has normal bulk and tone, no pronator drift and 5/5 strength bilaterally.  There are no adventitious movements.  Some bradykinesia of fine finger movements on the right.  Reflexes:  Are 2+ bilaterally in both the upper and lower extremities.    Coordination:  Normal finger to nose.  No dysdiadokinesia.  Sensation is intact to temperature and vibration.  Gait and Station are normal.  Tandem gait is intact.  Romberg is negative  Report of CT head x 2 were reviewed, as well as old records from hospitalization.  CT report from 10/30/2010 reports decreased attenuation in left parietal and frontal lobe involving insular cortex.  Impression: Cryptogenic ischemic stroke.  Likely thromboembolic.     Recommendations:  1.  Ischemic stroke - I am going to get an MRI/MRA head neck stroke protocol.  He did not fail aspirin as he was off  it for 6 days before the event so I would advocate him stopping the Aggrenox and switching to aspirin 325mg  a day.  His wife is going to get me a copy of the carotid U/S and echocardiogram.  Depending on his MRI results we will decide whether the patient needs a repeat TTE or TEE. 2.  Dizziness - may be medication( Aggrenox or oxycodone)  If this gets worse in the setting of trying to stop  these medications we can readdress at his next visit.   We will see the patient back in 6 weeks to review his results.  Thank you for having [place] Jacob Marks in consultation.  Feel free to contact me with any questions.  Lupita Raider Modesto Charon, MD [facility],  [address] [place], [place] [address] Phone: [phone] Fax: [phone].

## 2011-01-09 NOTE — Progress Notes (Deleted)
Subjective:    Patient ID: Jacob Marks is a 50 y.o. male.  HPI {Common ambulatory SmartLinks:[id number]}  Review of Systems  Objective:  Neurologic Exam  Physical Exam  Assessment:   ***  Plan:   ***

## 2011-01-21 ENCOUNTER — Ambulatory Visit (HOSPITAL_COMMUNITY): Payer: BC Managed Care – PPO

## 2011-01-21 ENCOUNTER — Other Ambulatory Visit (HOSPITAL_COMMUNITY): Payer: BC Managed Care – PPO

## 2011-01-21 ENCOUNTER — Ambulatory Visit (HOSPITAL_COMMUNITY)
Admission: RE | Admit: 2011-01-21 | Discharge: 2011-01-21 | Disposition: A | Discharge status: Home / Self Care | Payer: BC Managed Care – PPO | Source: Ambulatory Visit | Attending: Neurology | Admitting: Neurology

## 2011-01-21 ENCOUNTER — Ambulatory Visit (HOSPITAL_COMMUNITY): Admission: RE | Admit: 2011-01-21 | Payer: BC Managed Care – PPO | Source: Ambulatory Visit

## 2011-01-21 DIAGNOSIS — R209 Unspecified disturbances of skin sensation: Secondary | ICD-10-CM | POA: Insufficient documentation

## 2011-01-21 DIAGNOSIS — R930 Abnormal findings on diagnostic imaging of skull and head, not elsewhere classified: Secondary | ICD-10-CM | POA: Insufficient documentation

## 2011-01-21 DIAGNOSIS — I639 Cerebral infarction, unspecified: Secondary | ICD-10-CM

## 2011-01-21 DIAGNOSIS — R4789 Other speech disturbances: Secondary | ICD-10-CM | POA: Insufficient documentation

## 2011-01-21 DIAGNOSIS — I672 Cerebral atherosclerosis: Secondary | ICD-10-CM | POA: Insufficient documentation

## 2011-01-21 MED ORDER — GADOBENATE DIMEGLUMINE 529 MG/ML IV SOLN
20.0000 mL | Freq: Once | INTRAVENOUS | Status: AC
  Administered 2011-01-21: 20 mL via INTRAVENOUS

## 2011-01-22 MED ORDER — GADOBENATE DIMEGLUMINE 529 MG/ML IV SOLN
20.0000 mL | Freq: Once | INTRAVENOUS | Status: AC
  Administered 2011-01-21: 20 mL via INTRAVENOUS

## 2011-01-26 ENCOUNTER — Telehealth: Payer: Self-pay | Admitting: Neurology

## 2011-01-26 NOTE — Telephone Encounter (Signed)
Pt had MRI done on Wednesday and wife would like results. Her cell is listed above, her work number [phone], ext [phone].

## 2011-01-26 NOTE — Telephone Encounter (Signed)
spoke to wife.  told her MRA head and neck looked good -  I re-reviewed the imaging with neurorads and as I suspected the proximal subclavian stenosis was an overcall.  patient's wife is going to fax me his echo report.

## 2011-01-30 ENCOUNTER — Other Ambulatory Visit: Payer: Self-pay | Admitting: Family Medicine

## 2011-02-01 ENCOUNTER — Other Ambulatory Visit: Payer: Self-pay | Admitting: Family Medicine

## 2011-02-25 ENCOUNTER — Ambulatory Visit: Payer: BC Managed Care – PPO | Admitting: Neurology

## 2011-03-19 ENCOUNTER — Telehealth: Payer: Self-pay

## 2011-03-19 DIAGNOSIS — R1310 Dysphagia, unspecified: Secondary | ICD-10-CM | POA: Insufficient documentation

## 2011-03-19 NOTE — Telephone Encounter (Signed)
Pts wife concerned pt having issues with food getting stuck in pts throat. Pt does not choke and Dr Milinda Antis knows hx of throat issues with pts stroke.Pts wife wants pt seen by Dr Kelli Hope has seen Dr Leone Payor before) prior to pt having ileostomy reversal surgery on 03/1811 at [facility]. Ms Cecilio called Dr Teresita Madura office and was told 1st available is 04/10/11. Pts wife wants Dr Milinda Antis to do referral so can be seen before 04/07/11 about throat issue. Mrs Silliman can be reached at [phone].

## 2011-03-19 NOTE — Telephone Encounter (Signed)
Left vm for pt to callback 

## 2011-03-19 NOTE — Telephone Encounter (Signed)
I will go ahead and refer

## 2011-03-20 ENCOUNTER — Telehealth: Payer: Self-pay | Admitting: Internal Medicine

## 2011-03-20 NOTE — Telephone Encounter (Signed)
Reviewed telephone note from 03/19/11 I spoke with the patient he reports dysphagia "on my stroke side".  He reports solid food dysphagia I have scheduled an appt for 03/25/11 3:15

## 2011-03-25 ENCOUNTER — Telehealth: Payer: Self-pay | Admitting: Internal Medicine

## 2011-03-25 ENCOUNTER — Ambulatory Visit: Payer: BC Managed Care – PPO | Admitting: Internal Medicine

## 2011-03-25 NOTE — Telephone Encounter (Signed)
See note in contact info. Missed appointment. Do you want to charge?

## 2011-03-26 NOTE — Telephone Encounter (Signed)
No charge. 

## 2011-04-03 NOTE — Telephone Encounter (Signed)
Patient had appt on 03/25/11 with Dr. Leone Payor and no-showed.

## 2011-05-08 ENCOUNTER — Ambulatory Visit: Payer: Self-pay | Admitting: Vascular Surgery

## 2011-05-11 ENCOUNTER — Ambulatory Visit: Payer: Self-pay | Admitting: Urology

## 2011-05-12 ENCOUNTER — Encounter: Payer: Self-pay | Admitting: Neurology

## 2011-05-15 ENCOUNTER — Other Ambulatory Visit: Payer: Self-pay | Admitting: Vascular Surgery

## 2011-05-15 LAB — CBC WITH DIFFERENTIAL/PLATELET
Basophil %: 0.5 %
Eosinophil #: 0.4 10*3/uL (ref 0.0–0.7)
Eosinophil %: 7.2 %
HCT: 44.4 % (ref 40.0–52.0)
HGB: 15.3 g/dL (ref 13.0–18.0)
MCH: 29.8 pg (ref 26.0–34.0)
MCHC: 34.5 g/dL (ref 32.0–36.0)
MCV: 86 fL (ref 80–100)
Monocyte #: 0.6 10*3/uL (ref 0.0–0.7)
Neutrophil #: 3.3 10*3/uL (ref 1.4–6.5)
Neutrophil %: 57.6 %
Platelet: 234 10*3/uL (ref 150–440)
WBC: 5.8 10*3/uL (ref 3.8–10.6)

## 2011-05-15 LAB — BASIC METABOLIC PANEL
BUN: 16 mg/dL (ref 7–18)
Chloride: 106 mmol/L (ref 98–107)
Co2: 26 mmol/L (ref 21–32)
Osmolality: 273 (ref 275–301)
Potassium: 3.9 mmol/L (ref 3.5–5.1)
Sodium: 142 mmol/L (ref 136–145)

## 2011-05-21 ENCOUNTER — Ambulatory Visit: Payer: Self-pay | Admitting: Urology

## 2011-05-28 ENCOUNTER — Ambulatory Visit: Payer: Self-pay | Admitting: Urology

## 2011-06-09 ENCOUNTER — Encounter: Payer: Self-pay | Admitting: Family Medicine

## 2011-06-09 ENCOUNTER — Ambulatory Visit (INDEPENDENT_AMBULATORY_CARE_PROVIDER_SITE_OTHER): Payer: BC Managed Care – PPO | Admitting: Family Medicine

## 2011-06-09 ENCOUNTER — Ambulatory Visit (INDEPENDENT_AMBULATORY_CARE_PROVIDER_SITE_OTHER)
Admission: RE | Admit: 2011-06-09 | Discharge: 2011-06-09 | Disposition: A | Discharge status: Home / Self Care | Payer: BC Managed Care – PPO | Source: Ambulatory Visit | Attending: Family Medicine | Admitting: Family Medicine

## 2011-06-09 VITALS — BP 122/80 | HR 92 | Temp 98.0°F | Ht 67.0 in | Wt 222.2 lb

## 2011-06-09 DIAGNOSIS — R0600 Dyspnea, unspecified: Secondary | ICD-10-CM | POA: Insufficient documentation

## 2011-06-09 DIAGNOSIS — F419 Anxiety disorder, unspecified: Secondary | ICD-10-CM | POA: Insufficient documentation

## 2011-06-09 DIAGNOSIS — R0989 Other specified symptoms and signs involving the circulatory and respiratory systems: Secondary | ICD-10-CM

## 2011-06-09 DIAGNOSIS — R0982 Postnasal drip: Secondary | ICD-10-CM

## 2011-06-09 DIAGNOSIS — F411 Generalized anxiety disorder: Secondary | ICD-10-CM

## 2011-06-09 DIAGNOSIS — N200 Calculus of kidney: Secondary | ICD-10-CM | POA: Insufficient documentation

## 2011-06-09 NOTE — Assessment & Plan Note (Signed)
Adv stop the advil sinus med and change to zyrtec 10 mg daily and update Adv to call if any sinus pain or fever

## 2011-06-09 NOTE — Patient Instructions (Signed)
Stop the advil cold and sinus- I do not think it is helping and it may be causing more anxiety/ jitteriness / trouble sleeping For post nasal drip try plain zyrtec 10 mg at bedtime  Chest x ray today  Talk to your surgeon about the pain medication and withdrawal symptoms when you stop it

## 2011-06-09 NOTE — Progress Notes (Signed)
Subjective:    Patient ID: Jacob Marks, male    DOB: 1961-03-23, 51 y.o.   MRN: [id number]  HPI Here with symptoms of head congestion/ fatigue/ sob/ hoarseness Going on over 2 weeks Coughing a bit - not productive  No fever  No aches/ chills or sweats  Stays at home - is out of work (post op- not very active)-- on percocet  No sore throat or ear pain  No sinus pain , a little pressure  Little nasal discharge - is clear   Some hoarseness Quit smoking long time ago  Sob when he feels congested in back of throat -- post nasal drip   Is trying to wean himself off percocet - may be getting hooked on it -- feeling antsy when it wears off  Will have reversal surgery on march 4th Ostomy is painful once in a while- not all the time  Has not talked to his surgeon about opiod dependence   Wt is up 11 lb- not able to do anything   Had a kidney stone and urologist put him on flomax/ cipro/ oxycodone Mid January - last xray-- had broken up - 3 in ureter  Keeping eye on him - no more pain    Patient Active Problem List  Diagnoses  . TINEA VERSICOLOR  . NEOPLASM OF UNCERTAIN BEHAVIOR OF SKIN  . TESTOSTERONE DEFICIENCY  . HYPERLIPIDEMIA  . EYE FLOATERS  . HYPERTENSION, CONTROLLED  . IMPOTENCE, ORGANIC ORIGIN  . ONYCHIA AND PARONYCHIA OF FINGER  . SLEEP APNEA  . Fatigue  . Prostate cancer screening  . CVA (cerebral infarction)  . Depression  . Anemia  . Diverticulosis  . Wound infection after surgery  . Dysphagia  . Dyspnea  . Post-nasal drip  . Kidney stone   Past Medical History  Diagnosis Date  . Hypertension   . Hyperlipidemia   . Testosterone deficiency   . Sleep apnea   . ED (erectile dysfunction)   . Pes planus   . Heart murmur     as a child  . Asthma     as a child  . Diverticulosis 7/12    with hemicolectomy-- complications   . CVA (cerebral infarction) 7/12    after his hemicolectomy   Past Surgical History  Procedure Date  . Tonsillectomy and  adenoidectomy     as a child  . Testicle surgery     as a child  . Hemicolectomy 7/12    for diverticulosis, complic by leaking anastamosis/ abcess/ addn surg and iliostomy   . Carotid dopplers 8/12    normal - after cva    History  Substance Use Topics  . Smoking status: Former Smoker    Quit date: 04/20/1978  . Smokeless tobacco: Never Used  . Alcohol Use: Yes     2 drinks per week   Family History  Problem Relation Age of Onset  . Heart disease Mother 32    MI  . Hypertension Mother   . Alcohol abuse Mother   . Heart disease Father   . Hypertension Father   . Cancer Sister     colon   Allergies  Allergen Reactions  . Dilaudid (Hydromorphone Hcl) Other (See Comments)    Passed out  . Viagra   . Testosterone Rash    Rash from the patch   Current Outpatient Prescriptions on File Prior to Visit  Medication Sig Dispense Refill  . oxyCODONE-acetaminophen (PERCOCET) 7.5-325 MG per tablet Take 2 tablets  by mouth every 4 (four) hours as needed.        . zolpidem (AMBIEN) 10 MG tablet Take 1 tablet (10 mg total) by mouth at bedtime as needed.  30 tablet  1  . buPROPion (WELLBUTRIN XL) 150 MG 24 hr tablet Take 1 tablet (150 mg total) by mouth every morning.  30 tablet  11  . diphenoxylate-atropine (LOMOTIL) 2.5-0.025 MG per tablet Take 1 tablet by mouth 4 (four) times daily as needed.        . dipyridamole-aspirin (AGGRENOX) 25-200 MG per 12 hr capsule Take 1 capsule by mouth daily.  30 capsule  5  . ferrous sulfate 325 (65 FE) MG tablet Take 325 mg by mouth daily.        . ondansetron (ZOFRAN) 4 MG tablet Take 4 mg by mouth every 12 (twelve) hours as needed.        . pantoprazole (PROTONIX) 20 MG tablet Take 20 mg by mouth daily.           Review of Systems Review of Systems  Constitutional: Negative for fever, appetite change, fatigue and unexpected weight change.  Eyes: Negative for pain and visual disturbance.  ENT pos for runny nose and post nasal drip / no sinus pain   Respiratory: Negative for wheeze/ pos for mild cough and feeling of needing to take a deep breath (not sob on exertion) Cardiovascular: Negative for cp or palpitations    Gastrointestinal: Negative for nausea, diarrhea and constipation.  Genitourinary: Negative for urgency and frequency.  Skin: Negative for pallor or rash   Neurological: Negative for weakness, light-headedness, numbness and headaches.  Hematological: Negative for adenopathy. Does not bruise/bleed easily.  Psychiatric/Behavioral: Negative for dysphoric mood. The patient is nervous/ anxious, especially when his pain medication is wearing off        Objective:   Physical Exam  Constitutional: He appears well-developed and well-nourished. No distress.       overwt and well appearing   HENT:  Head: Normocephalic and atraumatic.  Right Ear: External ear normal.  Left Ear: External ear normal.  Mouth/Throat: Oropharynx is clear and moist.       Nares are boggy with clear rhinorrhea No sinus tenderness Throat - clear post nasal drip   Eyes: Conjunctivae and EOM are normal. Pupils are equal, round, and reactive to light. Right eye exhibits no discharge. Left eye exhibits no discharge.  Neck: Normal range of motion. Neck supple. No JVD present. Carotid bruit is not present. No thyromegaly present.  Cardiovascular: Normal rate, regular rhythm, normal heart sounds and intact distal pulses.  Exam reveals no gallop.   Pulmonary/Chest: Effort normal and breath sounds normal. No respiratory distress. He has no wheezes. He has no rales. He exhibits no tenderness.  Abdominal: Soft. Bowel sounds are normal. He exhibits no abdominal bruit. There is no tenderness.       With ostomy  Musculoskeletal: Normal range of motion. He exhibits no edema and no tenderness.  Lymphadenopathy:    He has no cervical adenopathy.  Neurological: He is alert. He has normal reflexes. No cranial nerve deficit. He exhibits normal muscle tone. Coordination  normal.       No tremor   Skin: Skin is warm and dry. No rash noted. No erythema. No pallor.  Psychiatric: His speech is normal and behavior is normal. Thought content normal. His mood appears anxious. His affect is blunt. His affect is not labile and not inappropriate. Cognition and memory are normal. He  expresses no suicidal plans.          Assessment & Plan:

## 2011-06-09 NOTE — Assessment & Plan Note (Signed)
I suspect there may be a component of anxiousness with withdrawal from opiods Also post nasal drip Reassuring exam  cxr today in light of recent uri and update

## 2011-06-09 NOTE — Assessment & Plan Note (Signed)
Pt continues to f/u with urologist-in no pain now

## 2011-06-09 NOTE — Assessment & Plan Note (Signed)
Suspect due to multiple med problems and now exacerbated by attempting to wean off of narcotics This may be causing the sensation of sob Pt will contact his surgeon to disc plan for pain management and getting off percocet

## 2011-06-11 ENCOUNTER — Ambulatory Visit: Payer: Self-pay | Admitting: Urology

## 2011-06-19 HISTORY — PX: ILEOSTOMY CLOSURE: SHX1784

## 2011-07-05 ENCOUNTER — Other Ambulatory Visit: Payer: Self-pay | Admitting: Family Medicine

## 2011-07-06 NOTE — Telephone Encounter (Signed)
Px written for call in   

## 2011-07-06 NOTE — Telephone Encounter (Signed)
[facility] [address] request refill zolpidem 10 mg. Pt last seen 06/09/11.Please advise.

## 2011-07-07 NOTE — Telephone Encounter (Signed)
rx called to pharmacy 

## 2011-07-14 ENCOUNTER — Telehealth: Payer: Self-pay | Admitting: Neurology

## 2011-07-14 NOTE — Telephone Encounter (Signed)
Called and spoke with the patient's wife. She was asking about getting a sooner appointment for her husband in order to expedite his disability paperwork. He is currently scheduled for f/u on 05/03 with Dr. Modesto Charon and is out on STD until the end of June. I told her that we would be more than happy to put him on the cancellation list and call him if an appointment became available. He would be able to come on a short notice. I told her we would put him on the list and call him if and when an appointment became available. The wife is in agreement with this plan.

## 2011-07-14 NOTE — Telephone Encounter (Signed)
Pt's wife states that they are trying to get disability for the patient. Patient had previous appointment for stroke, but then had several surgeries following the stroke and they were unable to tell which symptoms were from surgical recovery and which were from the stroke. They are now seeing several symptoms they think are related to the stroke, and permanent. The patient's back to work date is 10/17/2011, but they want to have disability paperwork done before then. He is currently scheduled for a fu appt on 08/21/2011 but would like to know if there is any Hoecker we can get him in sooner? Please call pt's wife back at cell phone [phone] or work phone [phone] ext [phone].

## 2011-08-21 ENCOUNTER — Ambulatory Visit (INDEPENDENT_AMBULATORY_CARE_PROVIDER_SITE_OTHER): Payer: BC Managed Care – PPO | Admitting: Neurology

## 2011-08-21 ENCOUNTER — Encounter: Payer: Self-pay | Admitting: Neurology

## 2011-08-21 VITALS — BP 154/84 | HR 72 | Ht 67.0 in | Wt 237.0 lb

## 2011-08-21 DIAGNOSIS — F411 Generalized anxiety disorder: Secondary | ICD-10-CM

## 2011-08-21 DIAGNOSIS — F419 Anxiety disorder, unspecified: Secondary | ICD-10-CM

## 2011-08-21 MED ORDER — CITALOPRAM HYDROBROMIDE 10 MG PO TABS: 10.0000 mg | ORAL_TABLET | Freq: Every day | ORAL | Status: DC

## 2011-08-21 NOTE — Progress Notes (Signed)
Dear Dr. Milinda Antis,  I saw  Jacob Marks back in [facility] for his problem with left hemispheric cryptogenic stroke.  As you may recall, he is a 51 y.o. year old male with a history of multiple colon surgeries who had a concomitant stroke affecting his right sided sensation and motor strength as well as his speech.  He reports that he now has burning pain in his right face, in his arm and his right upper back.  This is typically 4/10.  He also is having problems with anxiety around "something bad is going to happen again."  He expresses that he has difficulty speaking in groups -- he gets anxious about it.  He doesn't think he is having memory problems, just word finding problems.  He denies any significant weakness in his right side.  He is worried about having to go back to work in early June.  Apparently he has a hernia after his abdominal surgeries that may not allow him to do heavy lifting.  You tried him on Wellbutrin for his anxiety and depression and he said it made him "crazy".  He also complains of numbness in his right forearm secondary to "too many IVs".    Medical history, social history, and family history were reviewed and have not changed since the last clinic visit.  Meds: aspirin 325mg  daily lisinopril    Allergies  Allergen Reactions  . Dilaudid (Hydromorphone Hcl) Other (See Comments)    Passed out  . Sildenafil Citrate   . Testosterone Rash    Rash from the patch    ROS:  13 systems were reviewed and are notable for problems with word finding and concentration.  All other review of systems are unremarkable.  Exam: . Filed Vitals:   08/21/11 0946  BP: 154/84  Pulse: 72  Height: 5\' 7"  (1.702 m)  Weight: 237 lb (107.502 kg)    In general, tearful appearing man.  Mental status:   The patient is oriented to person, place and time. Recent and remote memory are intact. Attention span and concentration are normal. Language including naming  is slighlty slow. Fund of knowledge of current and historical events, as well as vocabulary are normal.  Cranial Nerves: Pupils are equally round and reactive to light. Visual fields full to confrontation. Extraocular movements are intact without nystagmus. Facial sensation decreased to temperature on the right  muscles of mastication are intact. Muscles of facial expression are symmetric. Hearing intact to bilateral finger rub. Tongue protrusion, uvula, palate midline.  Shoulder shrug intact  Motor:  Normal bulk and tone, no drift.  Some orbiting of right arm, and decreased fine finger movements.  Reflexes:  Brisker on the right, Toes down.  Coordination:  Normal finger to nose  Sensation:  may be decreased to temperature in the upper right back as well as shoulder, but not arm.   Gait:  Normal gait and station.  Romberg negative.  Impression/Recommendations:  1.  Ischemic stroke - This has left him with some post stroke dysesthetic pain, mild right upper extremity weakness, and very mild aphasia.  Overall he has recovered quite well.  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  I have started him on citalopram 10mg  daily and also sent him to behavioral health for counseling. I have asked him to follow up with you re the citalopram. 2.  Disability - I don't think I see any  reason he can't return to work from a neurologic standpoint.  I am hopeful with treatment of his anxiety this will be reasonable for him.  We will see the patient back in 3 months.  Lupita Raider Modesto Charon, MD [facility], [place]

## 2011-08-21 NOTE — Patient Instructions (Signed)
 Behavioral Health will call you to schedule your appointment at their [facility] office.

## 2011-09-02 ENCOUNTER — Ambulatory Visit: Payer: BC Managed Care – PPO | Admitting: Psychology

## 2011-11-23 ENCOUNTER — Ambulatory Visit: Payer: BC Managed Care – PPO | Admitting: Neurology

## 2012-03-21 ENCOUNTER — Ambulatory Visit (INDEPENDENT_AMBULATORY_CARE_PROVIDER_SITE_OTHER): Payer: BC Managed Care – PPO | Admitting: Family Medicine

## 2012-03-21 ENCOUNTER — Encounter: Payer: Self-pay | Admitting: Family Medicine

## 2012-03-21 VITALS — BP 136/70 | HR 86 | Temp 98.2°F | Ht 67.0 in | Wt 257.5 lb

## 2012-03-21 DIAGNOSIS — N529 Male erectile dysfunction, unspecified: Secondary | ICD-10-CM

## 2012-03-21 DIAGNOSIS — R079 Chest pain, unspecified: Secondary | ICD-10-CM

## 2012-03-21 DIAGNOSIS — K219 Gastro-esophageal reflux disease without esophagitis: Secondary | ICD-10-CM

## 2012-03-21 DIAGNOSIS — I10 Essential (primary) hypertension: Secondary | ICD-10-CM

## 2012-03-21 MED ORDER — PANTOPRAZOLE SODIUM 40 MG PO TBEC: 40.0000 mg | DELAYED_RELEASE_TABLET | Freq: Every day | ORAL | Status: DC

## 2012-03-21 NOTE — Progress Notes (Signed)
Subjective:    Patient ID: Jacob Marks, male    DOB: 1961/01/08, 51 y.o.   MRN: [id number]  HPI Here for indigestion and other problems  Having discomfort over chest with burping and pressure (? Heartburn)- mostly at night  Feels like he may have a "hard" or extra heartbeat now and then  Gets some relief when he burps  No n/v  No exertional symptoms  No sob or sweating   Sometimes eats late - 9 pm , goes to bed 10:30 or 11 pm  He does drink a lot of coffee and eats spicy food  Wt went up 20 lb   No change in stools - no blood or dark black stool  GI issues- colon wise are doing pretty good   Is interested in levitra or cialis Not sexually active due to erectile problems in the past month  Erections do not last long   levitra- gets a headache from  viagra gave him bad side effects - made him sob and chest hurt   Patient Active Problem List  Diagnosis  . TINEA VERSICOLOR  . NEOPLASM OF UNCERTAIN BEHAVIOR OF SKIN  . TESTOSTERONE DEFICIENCY  . HYPERLIPIDEMIA  . EYE FLOATERS  . HYPERTENSION, CONTROLLED  . IMPOTENCE, ORGANIC ORIGIN  . ONYCHIA AND PARONYCHIA OF FINGER  . SLEEP APNEA  . Fatigue  . Prostate cancer screening  . CVA (cerebral infarction)  . Depression  . Anemia  . Diverticulosis  . Wound infection after surgery  . Dysphagia  . Dyspnea  . Post-nasal drip  . Kidney stone  . Anxiety  . Chest pain  . GERD (gastroesophageal reflux disease)   Past Medical History  Diagnosis Date  . Hypertension   . Hyperlipidemia   . Testosterone deficiency   . Sleep apnea   . ED (erectile dysfunction)   . Pes planus   . Heart murmur     as a child  . Asthma     as a child  . Diverticulosis 7/12    with hemicolectomy-- complications   . CVA (cerebral infarction) 7/12    after his hemicolectomy   Past Surgical History  Procedure Date  . Tonsillectomy and adenoidectomy     as a child  . Testicle surgery     as a child  . Hemicolectomy 7/12    for  diverticulosis, complic by leaking anastamosis/ abcess/ addn surg and iliostomy   . Carotid dopplers 8/12    normal - after cva    History  Substance Use Topics  . Smoking status: Former Smoker    Quit date: 04/20/1978  . Smokeless tobacco: Never Used  . Alcohol Use: Yes     Comment: 2 drinks per week   Family History  Problem Relation Age of Onset  . Heart disease Mother 25    MI  . Hypertension Mother   . Alcohol abuse Mother   . Heart disease Father   . Hypertension Father   . Cancer Sister     colon   Allergies  Allergen Reactions  . Dilaudid (Hydromorphone Hcl) Other (See Comments)    Passed out  . Sildenafil Citrate   . Testosterone Rash    Rash from the patch   Current Outpatient Prescriptions on File Prior to Visit  Medication Sig Dispense Refill  . aspirin 325 MG tablet Take 325 mg by mouth daily.      Marland Kitchen lisinopril (PRINIVIL,ZESTRIL) 5 MG tablet Take 5 mg by mouth daily.      Marland Kitchen  buPROPion (WELLBUTRIN XL) 150 MG 24 hr tablet Take 1 tablet (150 mg total) by mouth every morning.  30 tablet  11       Review of Systems     Objective:   Physical Exam  Constitutional: He appears well-developed and well-nourished. No distress.       Obese and well appearing  HENT:  Head: Normocephalic and atraumatic.  Mouth/Throat: Oropharynx is clear and moist. No oropharyngeal exudate.  Eyes: Conjunctivae normal and EOM are normal. Pupils are equal, round, and reactive to light. Right eye exhibits no discharge. Left eye exhibits no discharge. No scleral icterus.  Neck: Normal range of motion. Neck supple. No JVD present. Carotid bruit is not present. No thyromegaly present.  Cardiovascular: Normal rate, regular rhythm, normal heart sounds and intact distal pulses.  Exam reveals no gallop.   Pulmonary/Chest: Effort normal and breath sounds normal. No respiratory distress. He has no wheezes.  Abdominal: Soft. Bowel sounds are normal. He exhibits no distension, no abdominal bruit  and no mass. There is tenderness.       Large midline scar present-well healed Mild epigastric tenderness without rebound or guarding   Musculoskeletal: Normal range of motion. He exhibits no edema and no tenderness.  Lymphadenopathy:    He has no cervical adenopathy.  Neurological: He is alert. He has normal reflexes. No cranial nerve deficit. He exhibits normal muscle tone. Coordination normal.  Skin: Skin is warm and dry. No rash noted. No erythema. No pallor.  Psychiatric: He has a normal mood and affect.          Assessment & Plan:

## 2012-03-21 NOTE — Assessment & Plan Note (Signed)
Suspect this is reflux related since it varies with diet and late night eating  Diet change Reassuring EKG- with one PAC If no imp consider cardiol eval

## 2012-03-21 NOTE — Patient Instructions (Addendum)
Start protonix once daily First dose as soon as you get it today Then every am  Gradually cut caff beverages (coffee) by one serving per week - ultimately until you have none Drink much more water  Also use caution with spicy or acidic foods and beverages  Call and update me in about 2 weeks - if greatly improved - I feel comfortable trying the levitra

## 2012-03-21 NOTE — Assessment & Plan Note (Signed)
Suspect this is cause of chest symptoms as it is brought on by late eating and spicy/ acidic foods and coffee Given diet handout Start back on protonix daily  Work on wt loss Update in 2 wk with progress Rev EKG, reassuring

## 2012-03-21 NOTE — Assessment & Plan Note (Addendum)
bp in fair control at this time  No changes needed  Disc lifstyle change with low sodium diet and exercise   Will continue to monitor

## 2012-03-21 NOTE — Assessment & Plan Note (Signed)
Ongoing problem Intol of viagra and in past levitra gave him a headache (but he could tolerate 1/2 pill) Will see if cp symptoms resolve with tx of gerd and diet change At 2 wk update if symptom free- can try levitra again-pt will call  Also recommended wt loss and fitness routine

## 2012-04-20 HISTORY — PX: EXTRACORPOREAL SHOCK WAVE LITHOTRIPSY: SHX1557

## 2012-06-28 ENCOUNTER — Other Ambulatory Visit: Payer: Self-pay | Admitting: Vascular Surgery

## 2012-06-28 LAB — RENAL FUNCTION PANEL
BUN: 19 mg/dL — ABNORMAL HIGH (ref 7–18)
Calcium, Total: 8.7 mg/dL (ref 8.5–10.1)
Co2: 28 mmol/L (ref 21–32)
Creatinine: 1.05 mg/dL (ref 0.60–1.30)
Glucose: 129 mg/dL — ABNORMAL HIGH (ref 65–99)
Osmolality: 283 (ref 275–301)
Phosphorus: 3 mg/dL (ref 2.5–4.9)

## 2012-07-19 DIAGNOSIS — I213 ST elevation (STEMI) myocardial infarction of unspecified site: Secondary | ICD-10-CM

## 2012-07-19 HISTORY — DX: ST elevation (STEMI) myocardial infarction of unspecified site: I21.3

## 2012-07-19 HISTORY — PX: CORONARY ANGIOPLASTY WITH STENT PLACEMENT: SHX49

## 2012-08-05 ENCOUNTER — Inpatient Hospital Stay: Payer: Self-pay | Admitting: Cardiovascular Disease

## 2012-08-05 DIAGNOSIS — I2119 ST elevation (STEMI) myocardial infarction involving other coronary artery of inferior wall: Secondary | ICD-10-CM

## 2012-08-05 DIAGNOSIS — I2510 Atherosclerotic heart disease of native coronary artery without angina pectoris: Secondary | ICD-10-CM

## 2012-08-05 HISTORY — DX: ST elevation (STEMI) myocardial infarction involving other coronary artery of inferior wall: I21.19

## 2012-08-05 LAB — CBC WITH DIFFERENTIAL/PLATELET
Eosinophil #: 0.4 10*3/uL (ref 0.0–0.7)
Eosinophil %: 3.7 %
HGB: 16.2 g/dL (ref 13.0–18.0)
Lymphocyte #: 4.2 10*3/uL — ABNORMAL HIGH (ref 1.0–3.6)
Lymphocyte %: 36.9 %
MCH: 29.5 pg (ref 26.0–34.0)
MCHC: 34 g/dL (ref 32.0–36.0)
Monocyte #: 1.3 x10 3/mm — ABNORMAL HIGH (ref 0.2–1.0)
Monocyte %: 11.3 %
Neutrophil #: 5.3 10*3/uL (ref 1.4–6.5)
Neutrophil %: 47.3 %
Platelet: 273 10*3/uL (ref 150–440)

## 2012-08-05 LAB — BASIC METABOLIC PANEL
Calcium, Total: 9.2 mg/dL (ref 8.5–10.1)
Co2: 23 mmol/L (ref 21–32)
Creatinine: 1.38 mg/dL — ABNORMAL HIGH (ref 0.60–1.30)
EGFR (Non-African Amer.): 58 — ABNORMAL LOW
Glucose: 169 mg/dL — ABNORMAL HIGH (ref 65–99)
Osmolality: 281 (ref 275–301)
Potassium: 3.2 mmol/L — ABNORMAL LOW (ref 3.5–5.1)

## 2012-08-05 LAB — CK TOTAL AND CKMB (NOT AT ARMC): CK-MB: 7.9 ng/mL — ABNORMAL HIGH (ref 0.5–3.6)

## 2012-08-06 DIAGNOSIS — I517 Cardiomegaly: Secondary | ICD-10-CM

## 2012-08-06 LAB — BASIC METABOLIC PANEL
BUN: 18 mg/dL (ref 7–18)
Calcium, Total: 8.3 mg/dL — ABNORMAL LOW (ref 8.5–10.1)
Chloride: 108 mmol/L — ABNORMAL HIGH (ref 98–107)
Co2: 25 mmol/L (ref 21–32)
Creatinine: 1.04 mg/dL (ref 0.60–1.30)
Glucose: 96 mg/dL (ref 65–99)
Osmolality: 277 (ref 275–301)
Potassium: 3.8 mmol/L (ref 3.5–5.1)

## 2012-08-06 LAB — TROPONIN I
Troponin-I: 2.44 ng/mL — ABNORMAL HIGH
Troponin-I: 3.9 ng/mL — ABNORMAL HIGH

## 2012-08-07 ENCOUNTER — Other Ambulatory Visit: Payer: Self-pay | Admitting: Cardiovascular Disease

## 2012-08-07 LAB — LIPID PANEL
Cholesterol: 134 mg/dL (ref 0–200)
HDL Cholesterol: 35 mg/dL — ABNORMAL LOW (ref 40–60)
Ldl Cholesterol, Calc: 73 mg/dL (ref 0–100)
Triglycerides: 130 mg/dL (ref 0–200)
VLDL Cholesterol, Calc: 26 mg/dL (ref 5–40)

## 2012-08-07 MED ORDER — LISINOPRIL 10 MG PO TABS: 10.0000 mg | ORAL_TABLET | Freq: Every day | ORAL | Status: DC

## 2012-08-07 MED ORDER — ATORVASTATIN CALCIUM 80 MG PO TABS: 80.0000 mg | ORAL_TABLET | Freq: Every day | ORAL | Status: DC

## 2012-08-07 MED ORDER — METOPROLOL TARTRATE 25 MG PO TABS: 25.0000 mg | ORAL_TABLET | Freq: Two times a day (BID) | ORAL | Status: DC

## 2012-08-07 MED ORDER — ASPIRIN EC 81 MG PO TBEC: 81.0000 mg | DELAYED_RELEASE_TABLET | Freq: Every day | ORAL | Status: DC

## 2012-08-07 MED ORDER — TICAGRELOR 90 MG PO TABS: 90.0000 mg | ORAL_TABLET | Freq: Two times a day (BID) | ORAL | Status: DC

## 2012-08-08 ENCOUNTER — Telehealth: Payer: Self-pay

## 2012-08-08 ENCOUNTER — Encounter: Payer: Self-pay | Admitting: Cardiovascular Disease

## 2012-08-08 NOTE — Telephone Encounter (Signed)
TCM  

## 2012-08-08 NOTE — Telephone Encounter (Signed)
Patient contacted regarding discharge from [facility] on 08/07/12.  Patient understands to follow up with provider Dr. Mariah Milling on 08/12/12 at 1045 at [facility] office. Patient understands discharge instructions? yes Patient understands medications and regiment? yes Patient understands to bring all medications to this visit? yes

## 2012-08-08 NOTE — Telephone Encounter (Signed)
Message copied by [facility], Jaysean Manville E on Mon Aug 08, 2012  1:50 PM ------      Message from: Coralee Rud      Created: Mon Aug 08, 2012  1:40 PM      Regarding: tcm/ph       This pt is scheduled this Friday 08/12/12 with dr Mariah Milling. Dr Kirke Corin did cath and placed stent, but pt wants to flup with dr Mariah Milling. ------

## 2012-08-12 ENCOUNTER — Encounter: Payer: Self-pay | Admitting: Cardiovascular Disease

## 2012-08-12 ENCOUNTER — Encounter: Payer: Self-pay | Admitting: *Deleted

## 2012-08-12 ENCOUNTER — Ambulatory Visit (INDEPENDENT_AMBULATORY_CARE_PROVIDER_SITE_OTHER): Payer: BC Managed Care – PPO | Admitting: Cardiovascular Disease

## 2012-08-12 VITALS — BP 114/68 | HR 70 | Ht 69.0 in | Wt 257.2 lb

## 2012-08-12 DIAGNOSIS — I6350 Cerebral infarction due to unspecified occlusion or stenosis of unspecified cerebral artery: Secondary | ICD-10-CM

## 2012-08-12 DIAGNOSIS — I213 ST elevation (STEMI) myocardial infarction of unspecified site: Secondary | ICD-10-CM | POA: Insufficient documentation

## 2012-08-12 DIAGNOSIS — E785 Hyperlipidemia, unspecified: Secondary | ICD-10-CM

## 2012-08-12 DIAGNOSIS — I639 Cerebral infarction, unspecified: Secondary | ICD-10-CM

## 2012-08-12 DIAGNOSIS — I10 Essential (primary) hypertension: Secondary | ICD-10-CM

## 2012-08-12 DIAGNOSIS — I219 Acute myocardial infarction, unspecified: Secondary | ICD-10-CM

## 2012-08-12 DIAGNOSIS — R0602 Shortness of breath: Secondary | ICD-10-CM

## 2012-08-12 NOTE — Assessment & Plan Note (Signed)
Currently on high-dose generic Lipitor. Goal LDL less than 70

## 2012-08-12 NOTE — Progress Notes (Signed)
Patient ID: Jacob Marks, male    DOB: [birth year], 52 y.o.   MRN: [id number]  HPI Comments: Jacob Marks is a pleasant 52 year old gentleman who installs air conditioning units at [employer] who presented to the hospital April 18 with STEMI, occluded distal RCA. He had significant thrombus. DES was placed to the distal RCA, aspiration thrombectomy with large thrombus revealed. Started on aspirin and brilinta. No other significant disease noted.  He was discharged from the hospital in presents for followup today. Echocardiogram in the hospital was essentially normal with normal ejection fraction estimated at greater than 55%, normal right ventricular systolic pressure, no significant wall motion abnormality. The hospital he was beta blocker, statin. ACE inhibitor held as his blood pressure was low.  In followup today, he reports that he is doing well. He is active, has been walking without any significant symptoms. He does report some shortness of breath if he walks too fast.   EKG shows normal sinus rhythm with rate 70 beats a minute, no significant ST or T wave changes     Outpatient Encounter Prescriptions as of 08/12/2012  Medication Sig Dispense Refill  . aspirin EC 81 MG tablet Take 1 tablet (81 mg total) by mouth daily.  90 tablet  3  . atorvastatin (LIPITOR) 80 MG tablet Take 1 tablet (80 mg total) by mouth daily.  90 tablet  3  . lisinopril (PRINIVIL,ZESTRIL) 10 MG tablet Take 1 tablet (10 mg total) by mouth daily.  90 tablet  3  . metoprolol tartrate (LOPRESSOR) 25 MG tablet Take 1 tablet (25 mg total) by mouth 2 (two) times daily.  180 tablet  3  . Ticagrelor (BRILINTA) 90 MG TABS tablet Take 1 tablet (90 mg total) by mouth 2 (two) times daily.  60 tablet  6  . [DISCONTINUED] buPROPion (WELLBUTRIN XL) 150 MG 24 hr tablet Take 1 tablet (150 mg total) by mouth every morning.  30 tablet  11  . [DISCONTINUED] Multiple Vitamin (MULTIVITAMIN) capsule Take 1 capsule by mouth daily.      .  [DISCONTINUED] pantoprazole (PROTONIX) 40 MG tablet Take 1 tablet (40 mg total) by mouth daily.  30 tablet  5   No facility-administered encounter medications on file as of 08/12/2012.     Review of Systems  Constitutional: Negative.   HENT: Negative.   Eyes: Negative.   Respiratory: Negative.   Cardiovascular: Negative.   Gastrointestinal: Negative.   Musculoskeletal: Negative.   Skin: Negative.   Neurological: Negative.   Psychiatric/Behavioral: Negative.   All other systems reviewed and are negative.    BP 114/68  Pulse 70  Ht 5\' 9"  (1.753 m)  Wt 257 lb 4 oz (116.688 kg)  BMI 37.97 kg/m2  Physical Exam  Nursing note and vitals reviewed. Constitutional: He is oriented to person, place, and time. He appears well-developed and well-nourished.  HENT:  Head: Normocephalic.  Nose: Nose normal.  Mouth/Throat: Oropharynx is clear and moist.  Eyes: Conjunctivae are normal. Pupils are equal, round, and reactive to light.  Neck: Normal range of motion. Neck supple. No JVD present.  Cardiovascular: Normal rate, regular rhythm, S1 normal, S2 normal, normal heart sounds and intact distal pulses.  Exam reveals no gallop and no friction rub.   No murmur heard. Pulmonary/Chest: Effort normal and breath sounds normal. No respiratory distress. He has no wheezes. He has no rales. He exhibits no tenderness.  Abdominal: Soft. Bowel sounds are normal. He exhibits no distension. There is no tenderness.  Musculoskeletal:  Normal range of motion. He exhibits no edema and no tenderness.  Lymphadenopathy:    He has no cervical adenopathy.  Neurological: He is alert and oriented to person, place, and time. Coordination normal.  Skin: Skin is warm and dry. No rash noted. No erythema.  Psychiatric: He has a normal mood and affect. His behavior is normal. Judgment and thought content normal.      Assessment and Plan

## 2012-08-12 NOTE — Patient Instructions (Addendum)
You are doing well. No medication changes were made.  Please call us if you have new issues that need to be addressed before your next appt.  Your physician wants you to follow-up in: 6 months.  You will receive a reminder letter in the mail two months in advance. If you don't receive a letter, please call our office to schedule the follow-up appointment.   

## 2012-08-12 NOTE — Assessment & Plan Note (Addendum)
Distal RCA occlusion with drug-eluting stent placed. Peak troponin 3.9. Intervention was done in a very short period of time. Followup echocardiogram showing minimal wall motion abnormality if any. Overall he is doing well. He'll stay out of work one more week. Continue current medications. Cholesterol check in 3 months time when he sees Jacob Marks. Goal total cholesterol less than 150, LDL less than 70. We'll hold off on ACE inhibitor given low blood pressure. He decreased his metoprolol secondary to fatigue.  He is very active. Would likely be able to skip cardiac rehabilitation and do this on his own. We have counseled him on this and the type of exercises he should do in the next several months.

## 2012-08-12 NOTE — Assessment & Plan Note (Signed)
Blood pressure well controlled on his current medications, possibly even low.

## 2012-09-27 ENCOUNTER — Ambulatory Visit (INDEPENDENT_AMBULATORY_CARE_PROVIDER_SITE_OTHER): Payer: BC Managed Care – PPO | Admitting: Family Medicine

## 2012-09-27 ENCOUNTER — Encounter: Payer: Self-pay | Admitting: Family Medicine

## 2012-09-27 VITALS — BP 110/66 | HR 88 | Temp 98.7°F | Ht 67.0 in | Wt 250.5 lb

## 2012-09-27 DIAGNOSIS — J069 Acute upper respiratory infection, unspecified: Secondary | ICD-10-CM

## 2012-09-27 DIAGNOSIS — J029 Acute pharyngitis, unspecified: Secondary | ICD-10-CM

## 2012-09-27 NOTE — Assessment & Plan Note (Signed)
With sore throat (neg RST)- worse at night  No fever-but some chills at night and lot of post nasal drainage Disc symptomatic care - see instructions on AVS  Update if not starting to improve in a week or if worsening

## 2012-09-27 NOTE — Patient Instructions (Addendum)
Drink lots of fluids Gargle with salt water  Try acetaminophen (tylenol) as directed for throat pain/ fever/ chills or aches  I think this will turn into a cold but if you develop fever over 101.5 or severe headache or sore throat - please let me know  Update if not starting to improve in a week or if worsening

## 2012-09-27 NOTE — Progress Notes (Signed)
Subjective:    Patient ID: Jacob Marks, male    DOB: 1960/12/02, 52 y.o.   MRN: [id number]  HPI Here with ST and aches  Monday stayed out of work due to fatigue and malaise and slept all day Yesterday - started a sore throat  Woke up in the middle of the night with more severe sore throat  Throat feels swollen  No hoarseness  Does have post nasal drip  No stuffy or runny nose  ? If gettting a cold   RST neg today  No fever at all  Had chills last night - but did not take temperature   No tick bites known  Also some teeth in the back lower jaw hurt   achey - especially in shoulders- but did do some physical work last week   Patient Active Problem List   Diagnosis Date Noted  . STEMI (ST elevation myocardial infarction) 08/12/2012  . Chest pain 03/21/2012  . GERD (gastroesophageal reflux disease) 03/21/2012  . Dyspnea 06/09/2011  . Post-nasal drip 06/09/2011  . Kidney stone 06/09/2011  . Anxiety 06/09/2011  . Dysphagia 03/19/2011  . Diverticulosis 01/04/2011  . Wound infection after surgery 01/04/2011  . CVA (cerebral infarction) 01/02/2011  . Depression 01/02/2011  . Anemia 01/02/2011  . Fatigue 08/05/2010  . Prostate cancer screening 08/05/2010  . TINEA VERSICOLOR 01/15/2010  . EYE FLOATERS 01/15/2010  . ONYCHIA AND PARONYCHIA OF FINGER 11/02/2008  . 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 TESTOSTERONE DEFICIENCY 11/16/2007  . HYPERTENSION, CONTROLLED 11/16/2007  . SLEEP APNEA 11/16/2007  . 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 HYPERLIPIDEMIA 05/28/2003   Past Medical History  Diagnosis Date  . Hypertension   . Hyperlipidemia   . Testosterone deficiency   . Sleep apnea   . ED (erectile dysfunction)   . Pes planus   . Heart murmur     as a child  . Asthma     as a child  . Diverticulosis 7/12    with hemicolectomy-- complications   . CVA (cerebral infarction) 7/12    after his hemicolectomy  . History of colon resection   . Stroke    . Coronary artery disease   . STEMI (ST elevation myocardial infarction)   . History of kidney cancer     lesion on the right kidney   Past Surgical History  Procedure Laterality Date  . Tonsillectomy and adenoidectomy      as a child  . Testicle surgery      as a child  . Hemicolectomy  7/12    for diverticulosis, complic by leaking anastamosis/ abcess/ addn surg and iliostomy   . Carotid dopplers  8/12    normal - after cva   . Cardiac catheterization    . Coronary angioplasty      s/p stent placement to the distal RCA   History  Substance Use Topics  . Smoking status: Former Smoker    Quit date: 04/20/1978  . Smokeless tobacco: Never Used  . Alcohol Use: Yes     Comment: 2 drinks per week   Family History  Problem Relation Age of Onset  . Heart disease Mother 69    MI  . Hypertension Mother   . Alcohol abuse Mother   . Heart disease Father   . Hypertension Father   . Cancer Sister     colon   Allergies  Allergen Reactions  . Dilaudid (Hydromorphone Hcl) Other (See Comments)    Passed  out  . Sildenafil Citrate   . Testosterone Rash    Rash from the patch   Current Outpatient Prescriptions on File Prior to Visit  Medication Sig Dispense Refill  . aspirin EC 81 MG tablet Take 1 tablet (81 mg total) by mouth daily.  90 tablet  3  . atorvastatin (LIPITOR) 80 MG tablet Take 1 tablet (80 mg total) by mouth daily.  90 tablet  3  . lisinopril (PRINIVIL,ZESTRIL) 10 MG tablet Take 1 tablet (10 mg total) by mouth daily.  90 tablet  3  . Ticagrelor (BRILINTA) 90 MG TABS tablet Take 1 tablet (90 mg total) by mouth 2 (two) times daily.  60 tablet  6  . metoprolol tartrate (LOPRESSOR) 25 MG tablet Take 1 tablet (25 mg total) by mouth 2 (two) times daily.  180 tablet  3   No current facility-administered medications on file prior to visit.      Review of Systems Review of Systems  Constitutional: Negative for  unexpected weight change. pos for malaise and fatigue and  body aches  Eyes: Negative for pain and visual disturbance.  ENT pos for ST and post nasal drip and ear discomfort, neg for sinus pain  Respiratory: Negative for cough and shortness of breath.   Cardiovascular: Negative for cp or palpitations    Gastrointestinal: Negative for nausea, diarrhea and constipation.  Genitourinary: Negative for urgency and frequency.  Skin: Negative for pallor or rash   Neurological: Negative for weakness, light-headedness, numbness and headaches.  Hematological: Negative for adenopathy. Does not bruise/bleed easily.  Psychiatric/Behavioral: Negative for dysphoric mood. The patient is not nervous/anxious.         Objective:   Physical Exam  Constitutional: He appears well-developed and well-nourished. No distress.  HENT:  Head: Normocephalic and atraumatic.  Right Ear: External ear normal.  Left Ear: External ear normal.  Mouth/Throat: No oropharyngeal exudate.  Nares are boggy Throat- clear post nasal drip with mild post injection and no swelling/ lesions or exudate  No sinus tenderness  Eyes: Conjunctivae and EOM are normal. Pupils are equal, round, and reactive to light. Right eye exhibits no discharge. Left eye exhibits no discharge. No scleral icterus.  Neck: Normal range of motion. Neck supple.  Cardiovascular: Normal rate and regular rhythm.   Pulmonary/Chest: Effort normal and breath sounds normal. No respiratory distress. He has no wheezes. He has no rales.  Abdominal: Soft. Bowel sounds are normal.  Musculoskeletal: He exhibits no edema and no tenderness.  No acute joint changes   Lymphadenopathy:    He has no cervical adenopathy.  Neurological: He is alert. He has normal reflexes. No cranial nerve deficit. He exhibits normal muscle tone. Coordination normal.  Skin: Skin is warm and dry. No rash noted.  Psychiatric: He has a normal mood and affect.          Assessment & Plan:

## 2012-09-30 ENCOUNTER — Telehealth: Payer: Self-pay

## 2012-09-30 DIAGNOSIS — G4730 Sleep apnea, unspecified: Secondary | ICD-10-CM

## 2012-09-30 NOTE — Addendum Note (Signed)
Addended by: Roxy Manns A on: 09/30/2012 03:00 PM   Modules accepted: Orders

## 2012-09-30 NOTE — Telephone Encounter (Signed)
Spoke with pt and he agreed to see the sleep dpt of pulmonary to be evaluated for new c-pap machine, I advise pt Jacob Marks will call to set up referral

## 2012-09-30 NOTE — Telephone Encounter (Signed)
Since it has been that long I need to get him back in with sleep medicine in our pulmonary office -please ask him if ok and I will do the referral

## 2012-09-30 NOTE — Telephone Encounter (Addendum)
pts wife left v/m; pt was seen recently and forgot to ask Dr Milinda Antis about getting pt back on cpap. Pt is not sure if needs new cpap machine (wants Dr Royden Purl opinion) and will need all supplies for a cpap. Call pt back.  I spoke with pt; pt has not used cpap for 2-3 years. C pap machine does work, pt wants to change from face mask to nasal tubes, request new tubing also. Pt does not remember the name of the doctor that prescribed the cpap machine.Please advise.

## 2012-11-03 ENCOUNTER — Institutional Professional Consult (permissible substitution): Payer: BC Managed Care – PPO | Admitting: Pulmonary Disease

## 2013-01-11 ENCOUNTER — Other Ambulatory Visit: Payer: Self-pay | Admitting: Physician Assistant

## 2013-01-11 LAB — RENAL FUNCTION PANEL
Albumin: 3.8 g/dL (ref 3.4–5.0)
Anion Gap: 3 — ABNORMAL LOW (ref 7–16)
Calcium, Total: 9 mg/dL (ref 8.5–10.1)
Chloride: 107 mmol/L (ref 98–107)
Creatinine: 1.55 mg/dL — ABNORMAL HIGH (ref 0.60–1.30)
EGFR (African American): 59 — ABNORMAL LOW
Glucose: 81 mg/dL (ref 65–99)
Osmolality: 279 (ref 275–301)
Potassium: 4.1 mmol/L (ref 3.5–5.1)
Sodium: 140 mmol/L (ref 136–145)

## 2013-02-10 ENCOUNTER — Ambulatory Visit: Payer: BC Managed Care – PPO | Admitting: Cardiovascular Disease

## 2013-02-16 ENCOUNTER — Ambulatory Visit (INDEPENDENT_AMBULATORY_CARE_PROVIDER_SITE_OTHER): Payer: BC Managed Care – PPO | Admitting: Cardiovascular Disease

## 2013-02-16 ENCOUNTER — Encounter: Payer: Self-pay | Admitting: Cardiovascular Disease

## 2013-02-16 VITALS — BP 140/80 | HR 60 | Ht 68.0 in | Wt 257.5 lb

## 2013-02-16 DIAGNOSIS — I10 Essential (primary) hypertension: Secondary | ICD-10-CM

## 2013-02-16 DIAGNOSIS — I213 ST elevation (STEMI) myocardial infarction of unspecified site: Secondary | ICD-10-CM

## 2013-02-16 DIAGNOSIS — E785 Hyperlipidemia, unspecified: Secondary | ICD-10-CM

## 2013-02-16 DIAGNOSIS — I219 Acute myocardial infarction, unspecified: Secondary | ICD-10-CM

## 2013-02-16 MED ORDER — METOPROLOL TARTRATE 25 MG PO TABS: 25.0000 mg | ORAL_TABLET | Freq: Two times a day (BID) | ORAL | Status: DC

## 2013-02-16 NOTE — Assessment & Plan Note (Signed)
Continue ACE inhibitor, add low-dose metoprolol

## 2013-02-16 NOTE — Assessment & Plan Note (Signed)
Appears to be doing well after STEMI earlier in the year. Encouraged him to restart the Toprol tartrate 12.5 mg twice a day if tolerated in addition to low-dose ACE inhibitor

## 2013-02-16 NOTE — Assessment & Plan Note (Signed)
Encouraged him to stay on his Lipitor 

## 2013-02-16 NOTE — Progress Notes (Signed)
   Patient ID: Jacob Marks, male    DOB: July 29, 1960, 52 y.o.   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 He had significant thrombus. DES was placed to the distal RCA, aspiration thrombectomy with large thrombus revealed. Started on aspirin and brilinta. No other significant disease noted.  Echocardiogram in the hospital was essentially normal with normal ejection fraction estimated at greater than 55%, normal right ventricular systolic pressure, no significant wall motion abnormality.   On prior visits, he had held his beta blocker for fatigue. In followup today, he feels well, no significant chest pain or shortness of breath. Is relatively active. Weight continues to be a problem. In the past, He  reported some shortness of breath if he walked too fast.   EKG shows normal sinus rhythm with rate 60 beats a minute, no significant ST or T wave changes     Outpatient Encounter Prescriptions as of 02/16/2013  Medication Sig Dispense Refill  . aspirin EC 81 MG tablet Take 1 tablet (81 mg total) by mouth daily.  90 tablet  3  . atorvastatin (LIPITOR) 40 MG tablet Take 40 mg by mouth daily.      Marland Kitchen lisinopril (PRINIVIL,ZESTRIL) 10 MG tablet Take 1 tablet (10 mg total) by mouth daily.  90 tablet  3  . Ticagrelor (BRILINTA) 90 MG TABS tablet Take 1 tablet (90 mg total) by mouth 2 (two) times daily.  60 tablet  6    Review of Systems  Constitutional: Negative.   HENT: Negative.   Eyes: Negative.   Respiratory: Negative.   Cardiovascular: Negative.   Gastrointestinal: Negative.   Musculoskeletal: Negative.   Skin: Negative.   Neurological: Negative.   Psychiatric/Behavioral: Negative.   All other systems reviewed and are negative.    BP 140/80  Pulse 60  Ht 5\' 8"  (1.727 m)  Wt 257 lb 8 oz (116.801 kg)  BMI 39.16 kg/m2  Physical Exam   Nursing note and vitals reviewed. Constitutional: He is oriented to person, place, and time. He appears well-developed and well-nourished.  HENT:  Head: Normocephalic.  Nose: Nose normal.  Mouth/Throat: Oropharynx is clear and moist.  Eyes: Conjunctivae are normal. Pupils are equal, round, and reactive to light.  Neck: Normal range of motion. Neck supple. No JVD present.  Cardiovascular: Normal rate, regular rhythm, S1 normal, S2 normal, normal heart sounds and intact distal pulses.  Exam reveals no gallop and no friction rub.   No murmur heard. Pulmonary/Chest: Effort normal and breath sounds normal. No respiratory distress. He has no wheezes. He has no rales. He exhibits no tenderness.  Abdominal: Soft. Bowel sounds are normal. He exhibits no distension. There is no tenderness.  Musculoskeletal: Normal range of motion. He exhibits no edema and no tenderness.  Lymphadenopathy:    He has no cervical adenopathy.  Neurological: He is alert and oriented to person, place, and time. Coordination normal.  Skin: Skin is warm and dry. No rash noted. No erythema.  Psychiatric: He has a normal mood and affect. His behavior is normal. Judgment and thought content normal.      Assessment and Plan

## 2013-02-16 NOTE — Patient Instructions (Signed)
You are doing well. Please start 1/2 pill of metoprolol in the AM for a few weeks If tolerated, take 1/2 pill metoprolol twice a day (cardiac protective, blood pressure)  Please call us if you have new issues that need to be addressed before your next appt.  Your physician wants you to follow-up in: 12 months.  You will receive a reminder letter in the mail two months in advance. If you don't receive a letter, please call our office to schedule the follow-up appointment.

## 2013-03-31 ENCOUNTER — Other Ambulatory Visit: Payer: Self-pay | Admitting: Cardiovascular Disease

## 2013-03-31 ENCOUNTER — Other Ambulatory Visit: Payer: Self-pay | Admitting: *Deleted

## 2013-03-31 MED ORDER — TICAGRELOR 90 MG PO TABS: 90.0000 mg | ORAL_TABLET | Freq: Two times a day (BID) | ORAL | Status: DC

## 2013-03-31 NOTE — Telephone Encounter (Signed)
Requested Prescriptions   Signed Prescriptions Disp Refills  . Ticagrelor (BRILINTA) 90 MG TABS tablet 60 tablet 6    Sig: Take 1 tablet (90 mg total) by mouth 2 (two) times daily.    Authorizing Provider: Antonieta Iba    Ordering User: Kendrick Fries

## 2013-06-19 ENCOUNTER — Emergency Department: Payer: Self-pay | Admitting: Emergency Medicine

## 2013-08-15 ENCOUNTER — Other Ambulatory Visit: Payer: Self-pay | Admitting: Cardiovascular Disease

## 2013-08-25 ENCOUNTER — Encounter: Payer: Self-pay | Admitting: Internal Medicine

## 2013-08-28 ENCOUNTER — Other Ambulatory Visit: Payer: Self-pay

## 2013-08-28 MED ORDER — LISINOPRIL 10 MG PO TABS: ORAL_TABLET | ORAL | Status: DC

## 2013-09-04 ENCOUNTER — Ambulatory Visit (INDEPENDENT_AMBULATORY_CARE_PROVIDER_SITE_OTHER): Payer: BC Managed Care – PPO | Admitting: Family Medicine

## 2013-09-04 ENCOUNTER — Encounter: Payer: Self-pay | Admitting: Family Medicine

## 2013-09-04 VITALS — BP 116/74 | HR 61 | Temp 98.9°F | Ht 68.0 in | Wt 261.2 lb

## 2013-09-04 DIAGNOSIS — H8110 Benign paroxysmal vertigo, unspecified ear: Secondary | ICD-10-CM

## 2013-09-04 DIAGNOSIS — J309 Allergic rhinitis, unspecified: Secondary | ICD-10-CM

## 2013-09-04 MED ORDER — FLUTICASONE PROPIONATE 50 MCG/ACT NA SUSP: 2.0000 | Freq: Every day | NASAL | Status: DC

## 2013-09-04 NOTE — Patient Instructions (Signed)
Use flonase for allergies and vertigo  I think allergy congestion is causing an inner ear problem  If you worsen or do not improve in 2 weeks please let me know   Vertigo Vertigo means you feel like you or your surroundings are moving when they are not. Vertigo can be dangerous if it occurs when you are at work, driving, or performing difficult activities.  CAUSES  Vertigo occurs when there is a conflict of signals sent to your brain from the visual and sensory systems in your body. There are many different causes of vertigo, including:  Infections, especially in the inner ear.  A bad reaction to a drug or misuse of alcohol and medicines.  Withdrawal from drugs or alcohol.  Rapidly changing positions, such as lying down or rolling over in bed.  A migraine headache.  Decreased blood flow to the brain.  Increased pressure in the brain from a head injury, infection, tumor, or bleeding. SYMPTOMS  You may feel as though the world is spinning around or you are falling to the ground. Because your balance is upset, vertigo can cause nausea and vomiting. You may have involuntary eye movements (nystagmus). DIAGNOSIS  Vertigo is usually diagnosed by physical exam. If the cause of your vertigo is unknown, your caregiver may perform imaging tests, such as an MRI scan (magnetic resonance imaging). TREATMENT  Most cases of vertigo resolve on their own, without treatment. Depending on the cause, your caregiver may prescribe certain medicines. If your vertigo is related to body position issues, your caregiver may recommend movements or procedures to correct the problem. In rare cases, if your vertigo is caused by certain inner ear problems, you may need surgery. HOME CARE INSTRUCTIONS   Follow your caregiver's instructions.  Avoid driving.  Avoid operating heavy machinery.  Avoid performing any tasks that would be dangerous to you or others during a vertigo episode.  Tell your caregiver if you  notice that certain medicines seem to be causing your vertigo. Some of the medicines used to treat vertigo episodes can actually make them worse in some people. SEEK IMMEDIATE MEDICAL CARE IF:   Your medicines do not relieve your vertigo or are making it worse.  You develop problems with talking, walking, weakness, or using your arms, hands, or legs.  You develop severe headaches.  Your nausea or vomiting continues or gets worse.  You develop visual changes.  A family member notices behavioral changes.  Your condition gets worse. MAKE SURE YOU:  Understand these instructions.  Will watch your condition.  Will get help right away if you are not doing well or get worse. Document Released: 01/14/2005 Document Revised: 06/29/2011 Document Reviewed: 10/23/2010 [facility] Patient Information 2014 [place]. Vertigo Vertigo means you feel like you or your surroundings are moving when they are not. Vertigo can be dangerous if it occurs when you are at work, driving, or performing difficult activities.  CAUSES  Vertigo occurs when there is a conflict of signals sent to your brain from the visual and sensory systems in your body. There are many different causes of vertigo, including:  Infections, especially in the inner ear.  A bad reaction to a drug or misuse of alcohol and medicines.  Withdrawal from drugs or alcohol.  Rapidly changing positions, such as lying down or rolling over in bed.  A migraine headache.  Decreased blood flow to the brain.  Increased pressure in the brain from a head injury, infection, tumor, or bleeding. SYMPTOMS  You may  feel as though the world is spinning around or you are falling to the ground. Because your balance is upset, vertigo can cause nausea and vomiting. You may have involuntary eye movements (nystagmus). DIAGNOSIS  Vertigo is usually diagnosed by physical exam. If the cause of your vertigo is unknown, your caregiver may perform imaging  tests, such as an MRI scan (magnetic resonance imaging). TREATMENT  Most cases of vertigo resolve on their own, without treatment. Depending on the cause, your caregiver may prescribe certain medicines. If your vertigo is related to body position issues, your caregiver may recommend movements or procedures to correct the problem. In rare cases, if your vertigo is caused by certain inner ear problems, you may need surgery. HOME CARE INSTRUCTIONS   Follow your caregiver's instructions.  Avoid driving.  Avoid operating heavy machinery.  Avoid performing any tasks that would be dangerous to you or others during a vertigo episode.  Tell your caregiver if you notice that certain medicines seem to be causing your vertigo. Some of the medicines used to treat vertigo episodes can actually make them worse in some people. SEEK IMMEDIATE MEDICAL CARE IF:   Your medicines do not relieve your vertigo or are making it worse.  You develop problems with talking, walking, weakness, or using your arms, hands, or legs.  You develop severe headaches.  Your nausea or vomiting continues or gets worse.  You develop visual changes.  A family member notices behavioral changes.  Your condition gets worse. MAKE SURE YOU:  Understand these instructions.  Will watch your condition.  Will get help right away if you are not doing well or get worse. Document Released: 01/14/2005 Document Revised: 06/29/2011 Document Reviewed: 10/23/2010 [facility] Patient Information 2014 [place].

## 2013-09-04 NOTE — Progress Notes (Signed)
Pre visit review using our clinic review tool, if applicable. No additional management support is needed unless otherwise documented below in the visit note. 

## 2013-09-04 NOTE — Assessment & Plan Note (Signed)
Suspect due to ETD from all rhinitis and started after recent airplane flight  tx with flonase ns - daily use Antihistamine prn  Did not px meclizine since episodes are brief/fleeting  Disc other neuro symptoms to watch for  Update if not starting to improve in a week or if worsening

## 2013-09-04 NOTE — Assessment & Plan Note (Signed)
Suspect this is causing ETD tx with flonase Can also use otc antihistamine if needed  Update if not starting to improve in a week or if worsening  (esp if sinus pain or worse vertigo)

## 2013-09-04 NOTE — Progress Notes (Signed)
Subjective:    Patient ID: Jacob Marks, male    DOB: 23-Dec-1960, 53 y.o.   MRN: [id number]  HPI Here with dizziness  He began having symptoms on 4/18 after traveling to [facility]   (flew) Happens when he lies down/ bends over - with position change , and when rolling over  Episodes 0-2 episodes per day and they last 2-3 seconds - passes quickly No head injury  No falls at all   BP Readings from Last 3 Encounters:  09/04/13 116/74  02/16/13 140/80  09/27/12 110/66   he watches his bp - was a little high at a machine at [employer] like he is retaining a bit of fluid  Trying to eat better  Some processed   Some seasonal allergies to pollen- worse with time  Keeps head congestion all the time now - symptoms are moderate/also sneezing   Patient Active Problem List   Diagnosis Date Noted  . Viral URI 09/27/2012  . STEMI (ST elevation myocardial infarction) 08/12/2012  . Chest pain 03/21/2012  . GERD (gastroesophageal reflux disease) 03/21/2012  . Dyspnea 06/09/2011  . Post-nasal drip 06/09/2011  . Kidney stone 06/09/2011  . Anxiety 06/09/2011  . Dysphagia 03/19/2011  . Diverticulosis 01/04/2011  . Wound infection after surgery 01/04/2011  . CVA (cerebral infarction) 01/02/2011  . Depression 01/02/2011  . Anemia 01/02/2011  . Fatigue 08/05/2010  . Prostate cancer screening 08/05/2010  . TINEA VERSICOLOR 01/15/2010  . EYE FLOATERS 01/15/2010  . ONYCHIA AND PARONYCHIA OF FINGER 11/02/2008  . 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 TESTOSTERONE DEFICIENCY 11/16/2007  . HYPERTENSION, CONTROLLED 11/16/2007  . SLEEP APNEA 11/16/2007  . 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 HYPERLIPIDEMIA 05/28/2003   Past Medical History  Diagnosis Date  . Hypertension   . Hyperlipidemia   . Testosterone deficiency   . Sleep apnea   . ED (erectile dysfunction)   . Pes planus   . Heart murmur     as a child  . Asthma     as a child  . Diverticulosis 7/12    with  hemicolectomy-- complications   . CVA (cerebral infarction) 7/12    after his hemicolectomy  . History of colon resection   . Stroke   . Coronary artery disease   . STEMI (ST elevation myocardial infarction)   . History of kidney cancer     lesion on the right kidney   Past Surgical History  Procedure Laterality Date  . Tonsillectomy and adenoidectomy      as a child  . Testicle surgery      as a child  . Hemicolectomy  7/12    for diverticulosis, complic by leaking anastamosis/ abcess/ addn surg and iliostomy   . Carotid dopplers  8/12    normal - after cva   . Cardiac catheterization    . Coronary angioplasty      s/p stent placement to the distal RCA   History  Substance Use Topics  . Smoking status: Former Smoker    Quit date: 04/20/1978  . Smokeless tobacco: Never Used  . Alcohol Use: Yes     Comment: 2 drinks per week   Family History  Problem Relation Age of Onset  . Heart disease Mother 23    MI  . Hypertension Mother   . Alcohol abuse Mother   . Heart disease Father   . Hypertension Father   . Cancer Sister     colon  Allergies  Allergen Reactions  . Dilaudid [Hydromorphone Hcl] Other (See Comments)    Passed out  . Sildenafil Citrate   . Testosterone Rash    Rash from the patch   Current Outpatient Prescriptions on File Prior to Visit  Medication Sig Dispense Refill  . aspirin EC 81 MG tablet Take 1 tablet (81 mg total) by mouth daily.  90 tablet  3  . atorvastatin (LIPITOR) 40 MG tablet Take 40 mg by mouth daily.      Marland Kitchen BRILINTA 90 MG TABS tablet TAKE ONE TABLET BY MOUTH TWICE DAILY  60 tablet  0  . lisinopril (PRINIVIL,ZESTRIL) 10 MG tablet TAKE ONE TABLET BY MOUTH ONCE DAILY  90 tablet  3  . metoprolol tartrate (LOPRESSOR) 25 MG tablet Take 1 tablet (25 mg total) by mouth 2 (two) times daily.  60 tablet  6  . Ticagrelor (BRILINTA) 90 MG TABS tablet Take 1 tablet (90 mg total) by mouth 2 (two) times daily.  60 tablet  6   No current  facility-administered medications on file prior to visit.    Review of Systems Review of Systems  Constitutional: Negative for fever, appetite change, fatigue and unexpected weight change.  Eyes: Negative for pain and visual disturbance. ENt pos for sinus congestion from allergies/ neg for ear pain or pressure   Respiratory: Negative for cough and shortness of breath.   Cardiovascular: Negative for cp or palpitations    Gastrointestinal: Negative for nausea, diarrhea and constipation.  Genitourinary: Negative for urgency and frequency.  Skin: Negative for pallor or rash   Neurological: Negative for weaknes, numbness and headaches.pos for dizziness with movement change that feels like spinning   Hematological: Negative for adenopathy. Does not bruise/bleed easily.  Psychiatric/Behavioral: Negative for dysphoric mood. The patient is not nervous/anxious.         Objective:   Physical Exam  Constitutional: He appears well-developed and well-nourished. No distress.  obese and well appearing   HENT:  Head: Normocephalic and atraumatic.  Right Ear: External ear normal.  Left Ear: External ear normal.  Mouth/Throat: Oropharynx is clear and moist.  Nares are injected and congested (clear rhinorrhea) TMs are dull but clear  Throat is clear   No sinus tenderness  Eyes: Conjunctivae and EOM are normal. Pupils are equal, round, and reactive to light. Right eye exhibits no discharge. Left eye exhibits no discharge. No scleral icterus.  2-3 beats of horizontal nystagmus bilaterally  Neck: Normal range of motion. Neck supple. No JVD present. Carotid bruit is not present. No tracheal deviation present.  Cardiovascular: Normal rate, regular rhythm, normal heart sounds and intact distal pulses.  Exam reveals no gallop.   No murmur heard. Pulmonary/Chest: Effort normal and breath sounds normal. No respiratory distress. He has no wheezes. He has no rales.  Musculoskeletal: He exhibits no edema.    Lymphadenopathy:    He has no cervical adenopathy.  Neurological: He is alert. He has normal reflexes. He displays no tremor. No cranial nerve deficit or sensory deficit. He exhibits normal muscle tone. Coordination and gait normal.  No focal cerebellar signs No dizziness while in the office   Skin: Skin is warm and dry. No rash noted. No pallor.  Psychiatric: He has a normal mood and affect.          Assessment & Plan:

## 2013-11-21 ENCOUNTER — Other Ambulatory Visit: Payer: Self-pay | Admitting: Cardiovascular Disease

## 2014-01-10 ENCOUNTER — Encounter: Payer: Self-pay | Admitting: Internal Medicine

## 2014-01-24 ENCOUNTER — Ambulatory Visit: Payer: Self-pay

## 2014-01-24 LAB — BASIC METABOLIC PANEL
Anion Gap: 5 — ABNORMAL LOW (ref 7–16)
BUN: 18 mg/dL (ref 7–18)
CO2: 29 mmol/L (ref 21–32)
Calcium, Total: 8.9 mg/dL (ref 8.5–10.1)
Chloride: 108 mmol/L — ABNORMAL HIGH (ref 98–107)
Creatinine: 1.18 mg/dL (ref 0.60–1.30)
EGFR (African American): >60
Glucose: 105 mg/dL — ABNORMAL HIGH (ref 65–99)
Osmolality: 285 (ref 275–301)
Potassium: 4.5 mmol/L (ref 3.5–5.1)
Sodium: 142 mmol/L (ref 136–145)

## 2014-02-05 ENCOUNTER — Other Ambulatory Visit: Payer: Self-pay | Admitting: Cardiovascular Disease

## 2014-02-21 ENCOUNTER — Ambulatory Visit (INDEPENDENT_AMBULATORY_CARE_PROVIDER_SITE_OTHER): Payer: BC Managed Care – PPO | Admitting: Cardiovascular Disease

## 2014-02-21 ENCOUNTER — Encounter: Payer: Self-pay | Admitting: Cardiovascular Disease

## 2014-02-21 VITALS — BP 138/70 | HR 62 | Ht 69.0 in | Wt 264.5 lb

## 2014-02-21 DIAGNOSIS — N528 Other male erectile dysfunction: Secondary | ICD-10-CM

## 2014-02-21 DIAGNOSIS — N529 Male erectile dysfunction, unspecified: Secondary | ICD-10-CM

## 2014-02-21 DIAGNOSIS — I213 ST elevation (STEMI) myocardial infarction of unspecified site: Secondary | ICD-10-CM

## 2014-02-21 DIAGNOSIS — E785 Hyperlipidemia, unspecified: Secondary | ICD-10-CM

## 2014-02-21 DIAGNOSIS — I10 Essential (primary) hypertension: Secondary | ICD-10-CM

## 2014-02-21 DIAGNOSIS — R0600 Dyspnea, unspecified: Secondary | ICD-10-CM

## 2014-02-21 MED ORDER — TADALAFIL 5 MG PO TABS: 5.0000 mg | ORAL_TABLET | Freq: Every day | ORAL | Status: DC | PRN

## 2014-02-21 MED ORDER — TICAGRELOR 60 MG PO TABS: 60.0000 mg | ORAL_TABLET | Freq: Two times a day (BID) | ORAL | Status: DC

## 2014-02-21 MED ORDER — LISINOPRIL 10 MG PO TABS: ORAL_TABLET | ORAL | Status: DC

## 2014-02-21 MED ORDER — METOPROLOL TARTRATE 25 MG PO TABS: 25.0000 mg | ORAL_TABLET | Freq: Two times a day (BID) | ORAL | Status: DC

## 2014-02-21 MED ORDER — ATORVASTATIN CALCIUM 80 MG PO TABS: 80.0000 mg | ORAL_TABLET | Freq: Every day | ORAL | Status: DC

## 2014-02-21 NOTE — Assessment & Plan Note (Addendum)
We have prescribed Cialis 5 mg daily. Suggested he call the office for any side effects

## 2014-02-21 NOTE — Patient Instructions (Addendum)
  Your next appointment will be scheduled in our new office located at :  [facility]  [address], [place], [place] [address]  You are doing well. Please decrease the brilinta down to 60 mg twice a day with aspirin 81 mg daily  Try the cialis 10 to 20 mg as needed  Please call us if you have new issues that need to be addressed before your next appt.  Your physician wants you to follow-up in: 12 months.  You will receive a reminder letter in the mail two months in advance. If you don't receive a letter, please call our office to schedule the follow-up appointment.

## 2014-02-21 NOTE — Assessment & Plan Note (Signed)
We will check his lipid panel at his convenience. Order given to him today

## 2014-02-21 NOTE — Assessment & Plan Note (Signed)
Blood pressure is well controlled on today's visit. No changes made to the medications. 

## 2014-02-21 NOTE — Progress Notes (Signed)
Patient ID: Jacob Marks, male    DOB: [birth year], 53 y.o.   MRN: [id number]  HPI Comments: 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 He had significant thrombus. DES was placed to the distal RCA, aspiration thrombectomy with large thrombus revealed. Started on aspirin and brilinta. No other significant disease noted. He presents today for routine follow-up  In general he reports that he is doing well. He denies any chest pain, shortness of breath. He is active at work. Continues to work at [employer] He will work another 8 years before retiring Medications are working relatively well for him. Weight continues to be a problem. He does report having symptoms of erectile dysfunction. Levitra previously caused headache, also had side effects on Viagra He is willing to try the Cialis  Past medical history Echocardiogram in the hospital was essentially normal with normal ejection fraction estimated at greater than 55%, normal right ventricular systolic pressure, no significant wall motion abnormality.   EKG shows normal sinus rhythm with rate 62 beats a minute, no significant ST or T wave changes     Outpatient Encounter Prescriptions as of 02/21/2014  Medication Sig  . Ascorbic Acid (VITAMIN C) 1000 MG tablet Take 1,000 mg by mouth daily.  Marland Kitchen aspirin EC 81 MG tablet Take 1 tablet (81 mg total) by mouth daily.  Marland Kitchen atorvastatin (LIPITOR) 80 MG tablet Take 1 tablet (80 mg total) by mouth daily at 6 PM.  . lisinopril (PRINIVIL,ZESTRIL) 10 MG tablet TAKE ONE TABLET BY MOUTH ONCE DAILY  . metoprolol tartrate (LOPRESSOR) 25 MG tablet Take 1 tablet (25 mg total) by mouth 2 (two) times daily.  Marland Kitchen pyridoxine (B-6) 100 MG tablet Take 100 mg by mouth daily.  . ticagrelor (BRILINTA) 60 MG TABS tablet Take 1 tablet (60 mg total) by mouth 2 (two) times daily.  . vitamin B-12 (CYANOCOBALAMIN)  500 MCG tablet Take 500 mcg by mouth daily.  . tadalafil (CIALIS) 5 MG tablet Take 1 tablet (5 mg total) by mouth daily as needed for erectile dysfunction.  . BRILINTA 90 MG TABS tablet TAKE ONE TABLET BY MOUTH TWICE DAILY      In terms of his social history  reports that he quit smoking about 35 years ago. He has never used smokeless tobacco. He reports that he drinks alcohol. He reports that he does not use illicit drugs.   Review of Systems  Constitutional: Negative.   Respiratory: Negative.   Cardiovascular: Negative.   Gastrointestinal: Negative.   Endocrine: Negative.   Genitourinary:       Erectile dysfunction  Musculoskeletal: Negative.   Skin: Negative.   Neurological: Negative.   All other systems reviewed and are negative.   BP 138/70 mmHg  Pulse 62  Ht 5\' 9"  (1.753 m)  Wt 264 lb 8 oz (119.976 kg)  BMI 39.04 kg/m2  Physical Exam  Constitutional: He is oriented to person, place, and time. He appears well-developed and well-nourished.  obese  HENT:  Head: Normocephalic.  Nose: Nose normal.  Mouth/Throat: Oropharynx is clear and moist.  Eyes: Conjunctivae are normal. Pupils are equal, round, and reactive to light.  Neck: Normal range of motion. Neck supple. No JVD present.  Cardiovascular: Normal rate, regular rhythm, S1 normal, S2 normal, normal heart sounds and intact distal pulses.  Exam reveals no gallop and no friction rub.   No murmur heard. Pulmonary/Chest:  Effort normal and breath sounds normal. No respiratory distress. He has no wheezes. He has no rales. He exhibits no tenderness.  Abdominal: Soft. Bowel sounds are normal. He exhibits no distension. There is no tenderness.  Musculoskeletal: Normal range of motion. He exhibits no edema or tenderness.  Lymphadenopathy:    He has no cervical adenopathy.  Neurological: He is alert and oriented to person, place, and time. Coordination normal.  Skin: Skin is warm and dry. No rash noted. No erythema.   Psychiatric: He has a normal mood and affect. His behavior is normal. Judgment and thought content normal.      Assessment and Plan   Nursing note and vitals reviewed.

## 2014-02-21 NOTE — Assessment & Plan Note (Signed)
Mild shortness of breath likely secondary to obesity, deconditioning

## 2014-02-26 ENCOUNTER — Observation Stay: Payer: Self-pay | Admitting: Surgery

## 2014-02-26 LAB — CBC WITH DIFFERENTIAL/PLATELET
BASOS ABS: 0.1 10*3/uL (ref 0.0–0.1)
Basophil %: 0.8 %
EOS ABS: 0.4 10*3/uL (ref 0.0–0.7)
EOS PCT: 5.5 %
HCT: 46.6 % (ref 40.0–52.0)
HGB: 15.8 g/dL (ref 13.0–18.0)
LYMPHS ABS: 1.9 10*3/uL (ref 1.0–3.6)
LYMPHS PCT: 23.4 %
MCH: 30.6 pg (ref 26.0–34.0)
MCHC: 33.9 g/dL (ref 32.0–36.0)
MCV: 90 fL (ref 80–100)
Monocyte #: 0.9 x10 3/mm (ref 0.2–1.0)
Monocyte %: 10.9 %
NEUTROS ABS: 4.8 10*3/uL (ref 1.4–6.5)
Neutrophil %: 59.4 %
Platelet: 164 10*3/uL (ref 150–440)
RBC: 5.16 10*6/uL (ref 4.40–5.90)
RDW: 13.6 % (ref 11.5–14.5)
WBC: 8.2 10*3/uL (ref 3.8–10.6)

## 2014-02-26 LAB — URINALYSIS, COMPLETE
BACTERIA: NONE SEEN
BILIRUBIN, UR: NEGATIVE
Blood: NEGATIVE
Glucose,UR: NEGATIVE mg/dL (ref 0–75)
Ketone: NEGATIVE
Leukocyte Esterase: NEGATIVE
NITRITE: NEGATIVE
Ph: 5 (ref 4.5–8.0)
Protein: NEGATIVE
RBC,UR: 3 /HPF (ref 0–5)
Specific Gravity: 1.02 (ref 1.003–1.030)
WBC UR: 1 /HPF (ref 0–5)

## 2014-02-26 LAB — COMPREHENSIVE METABOLIC PANEL
ALBUMIN: 3.9 g/dL (ref 3.4–5.0)
ALK PHOS: 105 U/L
ALT: 45 U/L
ANION GAP: 7 (ref 7–16)
BUN: 16 mg/dL (ref 7–18)
Bilirubin,Total: 0.8 mg/dL (ref 0.2–1.0)
CALCIUM: 8.4 mg/dL — AB (ref 8.5–10.1)
Chloride: 105 mmol/L (ref 98–107)
Co2: 30 mmol/L (ref 21–32)
Creatinine: 1.22 mg/dL (ref 0.60–1.30)
EGFR (African American): >60
EGFR (Non-African Amer.): >60
GLUCOSE: 141 mg/dL — AB (ref 65–99)
OSMOLALITY: 287 (ref 275–301)
Potassium: 3.7 mmol/L (ref 3.5–5.1)
SGOT(AST): 35 U/L (ref 15–37)
SODIUM: 142 mmol/L (ref 136–145)
TOTAL PROTEIN: 7.1 g/dL (ref 6.4–8.2)

## 2014-02-26 LAB — LIPASE, BLOOD: Lipase: 144 U/L (ref 73–393)

## 2014-02-26 LAB — TROPONIN I: Troponin-I: <0.02 ng/mL

## 2014-03-14 ENCOUNTER — Telehealth: Payer: Self-pay | Admitting: Cardiovascular Disease

## 2014-03-14 ENCOUNTER — Other Ambulatory Visit: Payer: Self-pay | Admitting: Physician Assistant

## 2014-03-14 NOTE — Telephone Encounter (Signed)
Pt wife called seeing if we got the surgical clearance from [facility]. Please  Call patient when this has been done.

## 2014-03-14 NOTE — Telephone Encounter (Signed)
Per Thurmond Butts, pt "may stop Brilinta 48 hrs prior to surgery.  Would prefer to continue aspirin if surgeon is ok w/ this. Cleared for surgery at moderate cardiac risk. 6.6% risk of MI, PE, v-fib, cardiac arrest, or CHB." Faxed to [facility] at [phone].

## 2014-03-14 NOTE — Telephone Encounter (Signed)
On Christell Faith, PA's desk for review.

## 2014-03-28 ENCOUNTER — Telehealth: Payer: Self-pay | Admitting: Family Medicine

## 2014-03-28 ENCOUNTER — Encounter: Payer: Self-pay | Admitting: Internal Medicine

## 2014-03-28 ENCOUNTER — Ambulatory Visit (INDEPENDENT_AMBULATORY_CARE_PROVIDER_SITE_OTHER): Payer: BC Managed Care – PPO | Admitting: Internal Medicine

## 2014-03-28 VITALS — BP 124/80 | HR 61 | Temp 98.1°F | Wt 266.0 lb

## 2014-03-28 DIAGNOSIS — R0981 Nasal congestion: Secondary | ICD-10-CM

## 2014-03-28 MED ORDER — MOMETASONE FUROATE 50 MCG/ACT NA SUSP: 2.0000 | Freq: Every day | NASAL | Status: DC

## 2014-03-28 NOTE — Progress Notes (Signed)
HPI  Pt presents to the clinic today with c/o right sided facial pain and pressure, with right ear pain. He reports this started 2 weeks ago. He is not blowing mucous out of his nose. He denies fever, chills or body aches. He has tried OTC Advil cold and sinus with some relief. He does have history of seasonal allergies. He has had sick contacts. He does not smoke.  Review of Systems    Past Medical History  Diagnosis Date  . Hypertension   . Hyperlipidemia   . Testosterone deficiency   . Sleep apnea   . ED (erectile dysfunction)   . Pes planus   . Heart murmur     as a child  . Asthma     as a child  . Diverticulosis 7/12    with hemicolectomy-- complications   . CVA (cerebral infarction) 7/12    after his hemicolectomy  . History of colon resection   . Stroke   . Coronary artery disease   . STEMI (ST elevation myocardial infarction)   . History of kidney cancer     lesion on the right kidney    Family History  Problem Relation Age of Onset  . Heart disease Mother 58    MI  . Hypertension Mother   . Alcohol abuse Mother   . Heart disease Father   . Hypertension Father   . Cancer Sister     colon    History   Social History  . Marital Status: Married    Spouse Name: N/A    Number of Children: N/A  . Years of Education: N/A   Occupational History  . Not on file.   Social History Main Topics  . Smoking status: Former Smoker    Quit date: 04/20/1978  . Smokeless tobacco: Never Used  . Alcohol Use: 0.0 oz/week    0 Not specified per week     Comment: 2 drinks per week  . Drug Use: No  . Sexual Activity: Not on file   Other Topics Concern  . Not on file   Social History Narrative    Allergies  Allergen Reactions  . Dilaudid [Hydromorphone Hcl] Other (See Comments)    Passed out  . Sildenafil Citrate   . Testosterone Rash    Rash from the patch     Constitutional:  Denies headache, fatigue, fever or abrupt weight changes.  HEENT:  Positive  facial pain, nasal congestion and ear pain. Denies eye redness, ringing in the ears, wax buildup, runny nose or sore throat. Respiratory:  Denies cough, difficulty breathing or shortness of breath.  Cardiovascular: Denies chest pain, chest tightness, palpitations or swelling in the hands or feet.   No other specific complaints in a complete review of systems (except as listed in HPI above).  Objective:  BP 124/80 mmHg  Pulse 61  Temp(Src) 98.1 F (36.7 C) (Oral)  Wt 266 lb (120.657 kg)  SpO2 98%   General: Appears his stated age, obese in NAD. HEENT: Head: normal shape and size, no sinus tenderness noted; Ears: Tm's pink but intact, normal light reflex, + serous effusion bilaterally; Nose: mucosa pink and moist, septum midline; Throat/Mouth: + PND. Teeth present, mucosa pink and moist, no exudate noted, no lesions or ulcerations noted. No adenopathy noted. Cardiovascular: Normal rate and rhythm. S1,S2 noted.  No murmur, rubs or gallops noted.  Pulmonary/Chest: Normal effort and positive vesicular breath sounds. No respiratory distress. No wheezes, rales or ronchi noted.  Assessment & Plan:   Nasal Congestion:  No s/s of bacterial infection on exam Can use a Neti Pot which can be purchased from your local drug store. Nasonex 2 sprays each nostril for 3 days and then as needed.  Watch for fever, colored mucous or worsening facial/ear pain  RTC as needed or if symptoms persist.

## 2014-03-28 NOTE — Patient Instructions (Signed)

## 2014-03-28 NOTE — Telephone Encounter (Signed)
It is my late day and if I'm full - please put with first avail (or see if I get a cancellation)- otherwise ? Other office or UC

## 2014-03-28 NOTE — Telephone Encounter (Signed)
Scheduled w/Dr. Danise Mina 03/29/2014

## 2014-03-28 NOTE — Telephone Encounter (Signed)
Pt's wife called in and says pt is out of work today due to a poss sinus infection.  She says he's in a lot of pain from the pressure and that his teeth even hurt.  She tried to make an apptmt for today for him, but no one has anything.  She asked to see if there's anyway you could "work" him in today. Please advise. Thank you.

## 2014-03-28 NOTE — Progress Notes (Signed)
Pre visit review using our clinic review tool, if applicable. No additional management support is needed unless otherwise documented below in the visit note. 

## 2014-03-29 ENCOUNTER — Ambulatory Visit: Payer: BC Managed Care – PPO | Admitting: Family Medicine

## 2014-04-03 ENCOUNTER — Telehealth: Payer: Self-pay

## 2014-04-03 ENCOUNTER — Other Ambulatory Visit (INDEPENDENT_AMBULATORY_CARE_PROVIDER_SITE_OTHER): Payer: Self-pay | Admitting: General Surgery

## 2014-04-03 NOTE — Telephone Encounter (Signed)
Spoke to pt's wife and she states she will just have him f/u with PCP

## 2014-04-03 NOTE — Telephone Encounter (Signed)
pts wife calls; pt seen 03/28/14 and still having pain on lt side of face; pain is no better than when seen 03/28/14; pts right eye is red and oozing yellow green drainage with itching and eye is painful. Pt's wife said pt was told had conjunctivitis at 03/28/14 appt. Request med to [facility] request cb when med sent to pharmacy.

## 2014-04-03 NOTE — Telephone Encounter (Signed)
He was not told that he had conjunctivitis at his appt. This appears to be a new issue. He did not complain of eye redness or drainage at his previous appt. Will need followup. Until then, he can try warm compresses and saline eye drops.

## 2014-04-12 ENCOUNTER — Other Ambulatory Visit: Payer: Self-pay | Admitting: Cardiovascular Disease

## 2014-04-23 ENCOUNTER — Inpatient Hospital Stay (HOSPITAL_COMMUNITY)
Admission: EM | Admit: 2014-04-23 | Discharge: 2014-04-25 | DRG: 395 | Disposition: A | Discharge status: Home / Self Care | Payer: BC Managed Care – PPO | Attending: General Surgery | Admitting: General Surgery

## 2014-04-23 ENCOUNTER — Emergency Department (HOSPITAL_COMMUNITY): Payer: BC Managed Care – PPO

## 2014-04-23 ENCOUNTER — Encounter (HOSPITAL_COMMUNITY): Payer: Self-pay | Admitting: *Deleted

## 2014-04-23 DIAGNOSIS — Z7982 Long term (current) use of aspirin: Secondary | ICD-10-CM | POA: Diagnosis not present

## 2014-04-23 DIAGNOSIS — Z955 Presence of coronary angioplasty implant and graft: Secondary | ICD-10-CM | POA: Diagnosis not present

## 2014-04-23 DIAGNOSIS — Z7901 Long term (current) use of anticoagulants: Secondary | ICD-10-CM | POA: Diagnosis not present

## 2014-04-23 DIAGNOSIS — E669 Obesity, unspecified: Secondary | ICD-10-CM | POA: Diagnosis present

## 2014-04-23 DIAGNOSIS — E785 Hyperlipidemia, unspecified: Secondary | ICD-10-CM | POA: Diagnosis present

## 2014-04-23 DIAGNOSIS — I10 Essential (primary) hypertension: Secondary | ICD-10-CM | POA: Diagnosis present

## 2014-04-23 DIAGNOSIS — K430 Incisional hernia with obstruction, without gangrene: Principal | ICD-10-CM | POA: Diagnosis present

## 2014-04-23 DIAGNOSIS — I252 Old myocardial infarction: Secondary | ICD-10-CM | POA: Diagnosis not present

## 2014-04-23 DIAGNOSIS — K56609 Unspecified intestinal obstruction, unspecified as to partial versus complete obstruction: Secondary | ICD-10-CM

## 2014-04-23 DIAGNOSIS — Z8249 Family history of ischemic heart disease and other diseases of the circulatory system: Secondary | ICD-10-CM

## 2014-04-23 DIAGNOSIS — Z87891 Personal history of nicotine dependence: Secondary | ICD-10-CM | POA: Diagnosis not present

## 2014-04-23 DIAGNOSIS — I2510 Atherosclerotic heart disease of native coronary artery without angina pectoris: Secondary | ICD-10-CM | POA: Diagnosis present

## 2014-04-23 DIAGNOSIS — Z01818 Encounter for other preprocedural examination: Secondary | ICD-10-CM

## 2014-04-23 DIAGNOSIS — G4730 Sleep apnea, unspecified: Secondary | ICD-10-CM | POA: Diagnosis present

## 2014-04-23 DIAGNOSIS — Z8673 Personal history of transient ischemic attack (TIA), and cerebral infarction without residual deficits: Secondary | ICD-10-CM | POA: Diagnosis not present

## 2014-04-23 DIAGNOSIS — R109 Unspecified abdominal pain: Secondary | ICD-10-CM

## 2014-04-23 DIAGNOSIS — K5669 Other intestinal obstruction: Secondary | ICD-10-CM | POA: Diagnosis present

## 2014-04-23 LAB — CBC WITH DIFFERENTIAL/PLATELET
BASOS PCT: 1 % (ref 0–1)
Basophils Absolute: 0 10*3/uL (ref 0.0–0.1)
EOS PCT: 7 % — AB (ref 0–5)
Eosinophils Absolute: 0.5 10*3/uL (ref 0.0–0.7)
HCT: 45.2 % (ref 39.0–52.0)
HEMOGLOBIN: 15.5 g/dL (ref 13.0–17.0)
LYMPHS ABS: 2.1 10*3/uL (ref 0.7–4.0)
Lymphocytes Relative: 31 % (ref 12–46)
MCH: 29.7 pg (ref 26.0–34.0)
MCHC: 34.3 g/dL (ref 30.0–36.0)
MCV: 86.6 fL (ref 78.0–100.0)
Monocytes Absolute: 0.6 10*3/uL (ref 0.1–1.0)
Monocytes Relative: 9 % (ref 3–12)
NEUTROS PCT: 52 % (ref 43–77)
Neutro Abs: 3.4 10*3/uL (ref 1.7–7.7)
Platelets: 166 10*3/uL (ref 150–400)
RBC: 5.22 MIL/uL (ref 4.22–5.81)
RDW: 13.4 % (ref 11.5–15.5)
WBC: 6.6 10*3/uL (ref 4.0–10.5)

## 2014-04-23 LAB — URINALYSIS, ROUTINE W REFLEX MICROSCOPIC
BILIRUBIN URINE: NEGATIVE
Glucose, UA: NEGATIVE mg/dL
Hgb urine dipstick: NEGATIVE
Ketones, ur: NEGATIVE mg/dL
Leukocytes, UA: NEGATIVE
NITRITE: NEGATIVE
Protein, ur: NEGATIVE mg/dL
SPECIFIC GRAVITY, URINE: 1.017 (ref 1.005–1.030)
Urobilinogen, UA: 0.2 mg/dL (ref 0.0–1.0)
pH: 6 (ref 5.0–8.0)

## 2014-04-23 LAB — COMPREHENSIVE METABOLIC PANEL
ALBUMIN: 4.1 g/dL (ref 3.5–5.2)
ALT: 31 U/L (ref 0–53)
AST: 33 U/L (ref 0–37)
Alkaline Phosphatase: 90 U/L (ref 39–117)
Anion gap: 6 (ref 5–15)
BILIRUBIN TOTAL: 0.7 mg/dL (ref 0.3–1.2)
BUN: 12 mg/dL (ref 6–23)
CALCIUM: 9.3 mg/dL (ref 8.4–10.5)
CO2: 26 mmol/L (ref 19–32)
Chloride: 108 mEq/L (ref 96–112)
Creatinine, Ser: 1.04 mg/dL (ref 0.50–1.35)
GFR calc Af Amer: >90 mL/min (ref >90)
GFR calc non Af Amer: 80 mL/min — ABNORMAL LOW (ref >90)
GLUCOSE: 107 mg/dL — AB (ref 70–99)
Potassium: 3.9 mmol/L (ref 3.5–5.1)
SODIUM: 140 mmol/L (ref 135–145)
Total Protein: 6.5 g/dL (ref 6.0–8.3)

## 2014-04-23 LAB — I-STAT CG4 LACTIC ACID, ED: LACTIC ACID, VENOUS: 1.21 mmol/L (ref 0.5–2.2)

## 2014-04-23 LAB — LIPASE, BLOOD: LIPASE: 32 U/L (ref 11–59)

## 2014-04-23 MED ORDER — PANTOPRAZOLE SODIUM 40 MG IV SOLR
40.0000 mg | Freq: Once | INTRAVENOUS | Status: AC
  Administered 2014-04-23: 40 mg via INTRAVENOUS
  Filled 2014-04-23: qty 40

## 2014-04-23 MED ORDER — METOPROLOL TARTRATE 25 MG PO TABS
25.0000 mg | ORAL_TABLET | Freq: Two times a day (BID) | ORAL | Status: DC
  Administered 2014-04-24 – 2014-04-25 (×3): 25 mg via ORAL
  Filled 2014-04-23 (×5): qty 1

## 2014-04-23 MED ORDER — MORPHINE SULFATE 4 MG/ML IJ SOLN
4.0000 mg | Freq: Once | INTRAMUSCULAR | Status: AC
  Administered 2014-04-23: 4 mg via INTRAVENOUS
  Filled 2014-04-23: qty 1

## 2014-04-23 MED ORDER — ONDANSETRON HCL 4 MG/2ML IJ SOLN
4.0000 mg | Freq: Once | INTRAMUSCULAR | Status: AC
  Administered 2014-04-23: 4 mg via INTRAVENOUS
  Filled 2014-04-23: qty 2

## 2014-04-23 MED ORDER — ASPIRIN EC 81 MG PO TBEC
81.0000 mg | DELAYED_RELEASE_TABLET | Freq: Every day | ORAL | Status: DC
  Administered 2014-04-24 – 2014-04-25 (×2): 81 mg via ORAL
  Filled 2014-04-23 (×2): qty 1

## 2014-04-23 MED ORDER — IOHEXOL 300 MG/ML  SOLN
100.0000 mL | Freq: Once | INTRAMUSCULAR | Status: AC | PRN
  Administered 2014-04-23: 100 mL via INTRAVENOUS

## 2014-04-23 MED ORDER — SODIUM CHLORIDE 0.9 % IV BOLUS (SEPSIS)
1000.0000 mL | Freq: Once | INTRAVENOUS | Status: AC
  Administered 2014-04-23: 1000 mL via INTRAVENOUS

## 2014-04-23 MED ORDER — IOHEXOL 300 MG/ML  SOLN
25.0000 mL | INTRAMUSCULAR | Status: AC
  Administered 2014-04-23: 25 mL via ORAL

## 2014-04-23 NOTE — ED Provider Notes (Signed)
CSN: [id number]     Arrival date & time 04/23/14  1831 History   First MD Initiated Contact with Patient 04/23/14 2045     Chief Complaint  Patient presents with  . Abdominal Pain  . Hernia     (Consider location/radiation/quality/duration/timing/severity/associated sxs/prior Treatment) HPI Comments: Jacob Marks is a 54 y.o. male with a PMHx of HTN, HLD, testosterone deficiency, sleep apnea, erectile dysfunction, diverticulosis s/p hemicolectomy, CVA (after hemicolectomy), CAD, STEMI, and R kidney cancer/lesion, who presents to the ED with complaints of 5 days of intermittent periumbilical abdominal pain, bloating, belching, and nausea. Patient states that he has a known ventral hernia, and is scheduled for repair on 05/15/14 with Dr. Dalbert Batman. He was recently admitted at [facility] for a partial bowel obstruction related to this hernia approximately 1.5 months ago, he was nothing by mouth for 3 days and it resolved on its own without surgical intervention. Patient states that as of 5 days ago, he developed 5/10 intermittent sharp periumbilical pain which is nonradiating, worse with movement, with no known alleviating factors and no medications tried. He states that this pain is very similar to the pain he experienced when he was admitted at [facility]. Patient endorses associated heartburn. Last ate at 3pm, last BM at 1pm. Still passing flatus. Denies fevers, chills, CP, SOB, vomiting, diarrhea, constipation, obstipation, melena, hematochezia, hematuria, dysuria, testicular pain, scrotal swelling, myalgias, arthralgias, weakness, numbness, skin changes, sick contacts, recent travel/abx, suspicious food intake, or EtOH use.  Patient is a 54 y.o. male presenting with abdominal pain. The history is provided by the patient. No language interpreter was used.  Abdominal Pain Pain location:  Periumbilical Pain quality: sharp   Pain radiates to:  Does not radiate Pain severity:  Moderate  (5/10) Onset quality:  Gradual Duration:  5 days Timing:  Intermittent Progression:  Worsening Chronicity:  Recurrent Context: previous surgery   Relieved by:  None tried Worsened by:  Movement Ineffective treatments:  None tried Associated symptoms: belching and nausea   Associated symptoms: no chest pain, no chills, no constipation, no diarrhea, no dysuria, no fever, no flatus, no hematemesis, no hematochezia, no hematuria, no melena, no shortness of breath and no vomiting   Risk factors: multiple surgeries, obesity and recent hospitalization     Past Medical History  Diagnosis Date  . Hypertension   . Hyperlipidemia   . Testosterone deficiency   . Sleep apnea   . ED (erectile dysfunction)   . Pes planus   . Heart murmur     as a child  . Asthma     as a child  . Diverticulosis 7/12    with hemicolectomy-- complications   . CVA (cerebral infarction) 7/12    after his hemicolectomy  . History of colon resection   . Stroke   . Coronary artery disease   . STEMI (ST elevation myocardial infarction)   . History of kidney cancer     lesion on the right kidney   Past Surgical History  Procedure Laterality Date  . Tonsillectomy and adenoidectomy      as a child  . Testicle surgery      as a child  . Hemicolectomy  7/12    for diverticulosis, complic by leaking anastamosis/ abcess/ addn surg and iliostomy   . Carotid dopplers  8/12    normal - after cva   . Cardiac catheterization    . Coronary angioplasty      s/p stent placement to  the distal RCA   Family History  Problem Relation Age of Onset  . Heart disease Mother 52    MI  . Hypertension Mother   . Alcohol abuse Mother   . Heart disease Father   . Hypertension Father   . Cancer Sister     colon   History  Substance Use Topics  . Smoking status: Former Smoker    Quit date: 04/20/1978  . Smokeless tobacco: Never Used  . Alcohol Use: 0.0 oz/week    0 Not specified per week     Comment: 2 drinks per  week    Review of Systems  Constitutional: Negative for fever and chills.  Respiratory: Negative for shortness of breath.   Cardiovascular: Negative for chest pain.  Gastrointestinal: Positive for nausea, abdominal pain and abdominal distention ("bloated"). Negative for vomiting, diarrhea, constipation, blood in stool, melena, hematochezia, anal bleeding, flatus and hematemesis.  Genitourinary: Negative for dysuria, frequency, hematuria, flank pain, scrotal swelling and testicular pain.  Musculoskeletal: Negative for myalgias, back pain and arthralgias.  Skin: Negative for color change.  Allergic/Immunologic: Negative for immunocompromised state.  Neurological: Negative for dizziness, weakness, light-headedness, numbness and headaches.  Psychiatric/Behavioral: Negative for confusion.   10 Systems reviewed and are negative for acute change except as noted in the HPI.    Allergies  Dilaudid; Sildenafil citrate; and Testosterone  Home Medications   Prior to Admission medications   Medication Sig Start Date End Date Taking? Authorizing Provider  Ascorbic Acid (VITAMIN C) 1000 MG tablet Take 1,000 mg by mouth daily.    Historical Provider, MD  aspirin EC 81 MG tablet Take 1 tablet (81 mg total) by mouth daily. 08/07/12   Minna Merritts, MD  atorvastatin (LIPITOR) 80 MG tablet Take 1 tablet (80 mg total) by mouth daily at 6 PM. 02/21/14   Minna Merritts, MD  lisinopril (PRINIVIL,ZESTRIL) 10 MG tablet TAKE ONE TABLET BY MOUTH ONCE DAILY 02/21/14   Minna Merritts, MD  metoprolol tartrate (LOPRESSOR) 25 MG tablet Take 1 tablet (25 mg total) by mouth 2 (two) times daily. 02/21/14   Minna Merritts, MD  metoprolol tartrate (LOPRESSOR) 25 MG tablet TAKE ONE TABLET BY MOUTH TWICE DAILY 04/12/14   Minna Merritts, MD  mometasone (NASONEX) 50 MCG/ACT nasal spray Place 2 sprays into the nose daily. 03/28/14   Jearld Fenton, NP  pyridoxine (B-6) 100 MG tablet Take 100 mg by mouth daily.     Historical Provider, MD  tadalafil (CIALIS) 5 MG tablet Take 1 tablet (5 mg total) by mouth daily as needed for erectile dysfunction. 02/21/14   Minna Merritts, MD  ticagrelor (BRILINTA) 60 MG TABS tablet Take 1 tablet (60 mg total) by mouth 2 (two) times daily. 02/21/14   Minna Merritts, MD  vitamin B-12 (CYANOCOBALAMIN) 500 MCG tablet Take 500 mcg by mouth daily.    Historical Provider, MD   BP 150/69 mmHg  Pulse 68  Temp(Src) 97.9 F (36.6 C) (Oral)  Resp 18  SpO2 97% Physical Exam  Constitutional: He is oriented to person, place, and time. Vital signs are normal. He appears well-developed and well-nourished.  Non-toxic appearance. No distress.  Afebrile, nontoxic, NAD, obese male  HENT:  Head: Normocephalic and atraumatic.  Mouth/Throat: Oropharynx is clear and moist and mucous membranes are normal.  Eyes: Conjunctivae and EOM are normal. Right eye exhibits no discharge. Left eye exhibits no discharge.  Neck: Normal range of motion. Neck supple.  Cardiovascular: Normal rate,  regular rhythm, normal heart sounds and intact distal pulses.  Exam reveals no gallop and no friction rub.   No murmur heard. Pulmonary/Chest: Effort normal and breath sounds normal. No respiratory distress. He has no decreased breath sounds. He has no wheezes. He has no rhonchi. He has no rales.  Abdominal: Soft. He exhibits no distension. Bowel sounds are decreased. There is tenderness in the right upper quadrant and periumbilical area. There is no rigidity, no rebound, no guarding, no CVA tenderness, no tenderness at McBurney's point and negative Murphy's sign. A hernia is present. Hernia confirmed positive in the ventral area.    Obese abdomen which slightly limits exam. midline surgical scar noted, with ventral hernia, valsalva reveals some increased size of hernia but easily reduced. Soft, ND, +BS throughout although diminished slightly in the LUQ/LLQ, TTP in periumbilical area overtop of the ventral hernia  as well as in the RUQ, no r/g/r, neg murphy's, neg mcburney's, no CVA TTP   Musculoskeletal: Normal range of motion.  Neurological: He is alert and oriented to person, place, and time. He has normal strength. No sensory deficit.  Skin: Skin is warm, dry and intact. No rash noted.  Psychiatric: He has a normal mood and affect.  Nursing note and vitals reviewed.   ED Course  Procedures (including critical care time) Labs Review Labs Reviewed  CBC WITH DIFFERENTIAL - Abnormal; Notable for the following:    Eosinophils Relative 7 (*)    All other components within normal limits  COMPREHENSIVE METABOLIC PANEL - Abnormal; Notable for the following:    Glucose, Bld 107 (*)    GFR calc non Af Amer 80 (*)    All other components within normal limits  LIPASE, BLOOD  URINALYSIS, ROUTINE W REFLEX MICROSCOPIC  I-STAT CG4 LACTIC ACID, ED    Imaging Review Ct Abdomen Pelvis W Contrast  04/23/2014   CLINICAL DATA:  Pain at site of known ventral hernia, initial evaluation  EXAM: CT ABDOMEN AND PELVIS WITH CONTRAST  TECHNIQUE: Multidetector CT imaging of the abdomen and pelvis was performed using the standard protocol following bolus administration of intravenous contrast.  CONTRAST:  163mL OMNIPAQUE IOHEXOL 300 MG/ML  SOLN  COMPARISON:  None.  FINDINGS: Visualized portions of the lung bases are clear. Heart size is normal.  Mild to moderate hepatic steatosis. Several low-attenuation lesions in the left lobe of the liver, some too small to characterize but all likely represent cysts. The largest is about 1.5 cm and measures Hounsfield attenuation value of 0. Spleen is normal except for 17 mm low-attenuation lesion inferiorly. Gallbladder is normal. Pancreas is normal.  Mild fullness left adrenal gland is suggesting probable hyperplasia. Minimal fullness right adrenal gland. Upper pole right renal lesion measures about 15 mm with average attenuation value of 11, most consistent with a cyst. Lower pole 18 mm  lesion, exophytic, with average attenuation value of 56. Two stones lower pole right kidney, the larger measuring 5 mm, with no obstruction. 4 mm stone lower pole left kidney with no hydronephrosis.  Bladder is normal. Reproductive organs are normal. Large circumscribed fat containing mass anterior to the left proximal thigh musculature, measuring 11 cm. This is not completely visualized but it is likely a lipoma.  Nonobstructive bowel gas pattern. Stomach is normal. Large bowel is normal. Appendix is not dilated with no evidence of inflammatory change around the appendix.  Anterior pelvic wall demonstrates a hernia to the right of midline. This contains a few loops of small bowel. The distal  loop is decompressed while the other is mildly dilated at about 3.7 cm. There is mild hazy attenuation within the hernia sac, which measures about 8 cm. Distally small bowel is decompressed. Small bowel is regionally distended where involved with the hernia, but is decompressed both proximally and distally.  IMPRESSION: 1. Ventral hernia anterior right pelvic wall. Evidence of mild inflammation with focal bowel loop dilatation involving small bowel. Findings are concerning for possible strangulation and focal obstruction. Critical Value/emergent results were called by telephone at the time of interpretation on 04/23/2014 at 10:13 pm to [id number] , who verbally acknowledged these results. 2. 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 Upper pole right renal cyst. Lower pole indeterminate renal lesion. Consider renal ultrasound or MRI to further evaluate and exclude a solid mass. 4. Nonobstructing renal calculi 5. Nonspecific splenic mass statistically likely benign possibly a hemangioma. 6. Numerous liver lesions some too small to characterize, probably cysts. 7. Bilateral adrenal hyperplasia   Electronically Signed   By: Skipper Cliche M.D.   On: 04/23/2014 22:15     EKG  Interpretation None      MDM   Final diagnoses:  Abdominal pain, acute  Small bowel obstruction    54 y.o. male with abd pain, known ventral hernia, prior SBO. CBC unremarkable, CMP unremarkable, lipase WNL. Will obtain lactic acid, U/A, and get CT imaging to evaluate for SBO. Will give pain and nausea meds. Exam nonperitoneal but slightly diminished bowel sounds in L abdomen, and ventral hernia without incarcerated bowel. Will reassess shortly.  10:13 PM Lactic acid WNL, U/A clear, CT results called in by radiologist, shows dilated loops of small bowel concerning for obstruction/incarceration. Pain and nausea currently controlled, will consult [facility] now.  10:27 PM Dr Donne Hazel returning page, will admit pt. Please see his dictation for further documentation of care.   BP 144/79 mmHg  Pulse 69  Temp(Src) 97.9 F (36.6 C) (Oral)  Resp 18  SpO2 100%  Meds ordered this encounter  Medications  . ondansetron (ZOFRAN) injection 4 mg    Sig:   . morphine 4 MG/ML injection 4 mg    Sig:   . sodium chloride 0.9 % bolus 1,000 mL    Sig:   . iohexol (OMNIPAQUE) 300 MG/ML solution 25 mL    Sig:   . pantoprazole (PROTONIX) injection 40 mg    Sig:   . iohexol (OMNIPAQUE) 300 MG/ML solution 100 mL    Sig:       Patty Sermons McFall, PA-C 04/23/14 2237  Carmin Muskrat, MD 04/24/14 [phone]

## 2014-04-23 NOTE — ED Notes (Signed)
Patient transported to CT 

## 2014-04-23 NOTE — ED Notes (Signed)
Notified CT pt finished drinking contrast

## 2014-04-23 NOTE — H&P (Signed)
Jacob Marks is an 54 y.o. male.   Chief Complaint abd pain HPI: The patient is a 54 year old male who presented with an incisional hernia to Dr Dalbert Batman in our office. The beginning of this note is from his note recently. He is scheduled for surgery at the end of this month for open vh repair. He has been seen by his cardiologist and recommendation for continuing asa and stopping brilinta (which he took this am). On October 20, 2010 he underwent left hemicolectomy with anastomosis and takedown of splenic flexure. On October 29, 2010 he underwent reexploration because of large intra-abdominal hematoma and placement of negative pressure dressing On October 31, 2010 he underwent reexploration, lysis of adhesions and ileostomy/ On November 25, 2010 he underwent incision and drainage of abdominal wall abscess On March 2013 he underwent closure of ileostomy at [facility]. Fortunately he was able to do this through the right lower quadrant incision and pull up the old distal limb. He is followed by a urologist at [facility] who has stated that he does not need any further intervention for his tiny right kidney mass. He has follow-up in 1 year CT scan was performed on February 26, 2014 which shows the ventral hernia slightly to the right of the midline in the periumbilical area with small bowel in it. Multiple small bowel anastomoses were noted. Diverticulosis of the descending colon was noted. Bilateral lower pole nonobstructing kidney stones were noted.  Ejection fraction recently was 55% with normal right ventricular systolic pressures and no significant wall motion abnormalities. Note was made of his STEMI on August 05, 2012 with occluded distal RCA. Drug-eluting stent was performed.  Since NYE now he has increasing pain. He has some nausea, no emesis. He still is passing flatus and having bms though.  He comes in today due to continued pain.   Past Medical History  Diagnosis Date  . Hypertension   . Hyperlipidemia   .  Testosterone deficiency   . Sleep apnea   . ED (erectile dysfunction)   . Pes planus   . Heart murmur     as a child  . Asthma     as a child  . Diverticulosis 7/12    with hemicolectomy-- complications   . CVA (cerebral infarction) 7/12    after his hemicolectomy  . History of colon resection   . Stroke   . Coronary artery disease   . STEMI (ST elevation myocardial infarction)   . History of kidney cancer     lesion on the right kidney    Past Surgical History  Procedure Laterality Date  . Tonsillectomy and adenoidectomy      as a child  . Testicle surgery      as a child  . Hemicolectomy  7/12    for diverticulosis, complic by leaking anastamosis/ abcess/ addn surg and iliostomy   . Carotid dopplers  8/12    normal - after cva   . Cardiac catheterization    . Coronary angioplasty      s/p stent placement to the distal RCA  see above in hpi  Family History  Problem Relation Age of Onset  . Heart disease Mother 34    MI  . Hypertension Mother   . Alcohol abuse Mother   . Heart disease Father   . Hypertension Father   . Cancer Sister     colon   Social History:  reports that he quit smoking about 36 years ago.  He has never used smokeless tobacco. He reports that he drinks alcohol. He reports that he does not use illicit drugs.  Allergies:  Allergies  Allergen Reactions  . Dilaudid [Hydromorphone Hcl] Other (See Comments)    Passed out  . Sildenafil Citrate   . Testosterone Rash    Rash from the patch    Meds reviewed  Results for orders placed or performed during the hospital encounter of 04/23/14 (from the past 48 hour(s))  CBC with Differential     Status: Abnormal   Collection Time: 04/23/14  6:43 PM  Result Value Ref Range   WBC 6.6 4.0 - 10.5 K/uL   RBC 5.22 4.22 - 5.81 MIL/uL   Hemoglobin 15.5 13.0 - 17.0 g/dL   HCT 45.2 39.0 - 52.0 %   MCV 86.6 78.0 - 100.0 fL   MCH 29.7 26.0 - 34.0 pg   MCHC 34.3 30.0 - 36.0 g/dL   RDW 13.4 11.5 - 15.5 %     Platelets 166 150 - 400 K/uL   Neutrophils Relative % 52 43 - 77 %   Neutro Abs 3.4 1.7 - 7.7 K/uL   Lymphocytes Relative 31 12 - 46 %   Lymphs Abs 2.1 0.7 - 4.0 K/uL   Monocytes Relative 9 3 - 12 %   Monocytes Absolute 0.6 0.1 - 1.0 K/uL   Eosinophils Relative 7 (H) 0 - 5 %   Eosinophils Absolute 0.5 0.0 - 0.7 K/uL   Basophils Relative 1 0 - 1 %   Basophils Absolute 0.0 0.0 - 0.1 K/uL  Comprehensive metabolic panel     Status: Abnormal   Collection Time: 04/23/14  6:43 PM  Result Value Ref Range   Sodium 140 135 - 145 mmol/L    Comment: Please note change in reference range.   Potassium 3.9 3.5 - 5.1 mmol/L    Comment: Please note change in reference range.   Chloride 108 96 - 112 mEq/L   CO2 26 19 - 32 mmol/L   Glucose, Bld 107 (H) 70 - 99 mg/dL   BUN 12 6 - 23 mg/dL   Creatinine, Ser 1.04 0.50 - 1.35 mg/dL   Calcium 9.3 8.4 - 10.5 mg/dL   Total Protein 6.5 6.0 - 8.3 g/dL   Albumin 4.1 3.5 - 5.2 g/dL   AST 33 0 - 37 U/L   ALT 31 0 - 53 U/L   Alkaline Phosphatase 90 39 - 117 U/L   Total Bilirubin 0.7 0.3 - 1.2 mg/dL   GFR calc non Af Amer 80 (L) >90 mL/min   GFR calc Af Amer >90 >90 mL/min    Comment: (NOTE) The eGFR has been calculated using the CKD EPI equation. This calculation has not been validated in all clinical situations. eGFR's persistently <90 mL/min signify possible Chronic Kidney Disease.    Anion gap 6 5 - 15  Lipase, blood     Status: None   Collection Time: 04/23/14  6:43 PM  Result Value Ref Range   Lipase 32 11 - 59 U/L  I-Stat CG4 Lactic Acid, ED     Status: None   Collection Time: 04/23/14  9:35 PM  Result Value Ref Range   Lactic Acid, Venous 1.21 0.5 - 2.2 mmol/L  Urinalysis, Routine w reflex microscopic     Status: None   Collection Time: 04/23/14  9:41 PM  Result Value Ref Range   Color, Urine YELLOW YELLOW   APPearance CLEAR CLEAR   Specific Gravity, Urine 1.017  1.005 - 1.030   pH 6.0 5.0 - 8.0   Glucose, UA NEGATIVE NEGATIVE mg/dL    Hgb urine dipstick NEGATIVE NEGATIVE   Bilirubin Urine NEGATIVE NEGATIVE   Ketones, ur NEGATIVE NEGATIVE mg/dL   Protein, ur NEGATIVE NEGATIVE mg/dL   Urobilinogen, UA 0.2 0.0 - 1.0 mg/dL   Nitrite NEGATIVE NEGATIVE   Leukocytes, UA NEGATIVE NEGATIVE    Comment: MICROSCOPIC NOT DONE ON URINES WITH NEGATIVE PROTEIN, BLOOD, LEUKOCYTES, NITRITE, OR GLUCOSE <1000 mg/dL.   Ct Abdomen Pelvis W Contrast  04/23/2014   CLINICAL DATA:  Pain at site of known ventral hernia, initial evaluation  EXAM: CT ABDOMEN AND PELVIS WITH CONTRAST  TECHNIQUE: Multidetector CT imaging of the abdomen and pelvis was performed using the standard protocol following bolus administration of intravenous contrast.  CONTRAST:  119m OMNIPAQUE IOHEXOL 300 MG/ML  SOLN  COMPARISON:  None.  FINDINGS: Visualized portions of the lung bases are clear. Heart size is normal.  Mild to moderate hepatic steatosis. Several low-attenuation lesions in the left lobe of the liver, some too small to characterize but all likely represent cysts. The largest is about 1.5 cm and measures Hounsfield attenuation value of 0. Spleen is normal except for 17 mm low-attenuation lesion inferiorly. Gallbladder is normal. Pancreas is normal.  Mild fullness left adrenal gland is suggesting probable hyperplasia. Minimal fullness right adrenal gland. Upper pole right renal lesion measures about 15 mm with average attenuation value of 11, most consistent with a cyst. Lower pole 18 mm lesion, exophytic, with average attenuation value of 56. Two stones lower pole right kidney, the larger measuring 5 mm, with no obstruction. 4 mm stone lower pole left kidney with no hydronephrosis.  Bladder is normal. Reproductive organs are normal. Large circumscribed fat containing mass anterior to the left proximal thigh musculature, measuring 11 cm. This is not completely visualized but it is likely a lipoma.  Nonobstructive bowel gas pattern. Stomach is normal. Large bowel is normal.  Appendix is not dilated with no evidence of inflammatory change around the appendix.  Anterior pelvic wall demonstrates a hernia to the right of midline. This contains a few loops of small bowel. The distal loop is decompressed while the other is mildly dilated at about 3.7 cm. There is mild hazy attenuation within the hernia sac, which measures about 8 cm. Distally small bowel is decompressed. Small bowel is regionally distended where involved with the hernia, but is decompressed both proximally and distally.  IMPRESSION: 1. Ventral hernia anterior right pelvic wall. Evidence of mild inflammation with focal bowel loop dilatation involving small bowel. Findings are concerning for possible strangulation and focal obstruction. Critical Value/emergent results were called by telephone at the time of interpretation on 04/23/2014 at 10:13 pm to [facility] , who verbally acknowledged these results. 2. 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 Upper pole right renal cyst. Lower pole indeterminate renal lesion. Consider renal ultrasound or MRI to further evaluate and exclude a solid mass. 4. Nonobstructing renal calculi 5. Nonspecific splenic mass statistically likely benign possibly a hemangioma. 6. Numerous liver lesions some too small to characterize, probably cysts. 7. Bilateral adrenal hyperplasia   Electronically Signed   By: RSkipper ClicheM.D.   On: 04/23/2014 22:15    Review of Systems  Constitutional: Negative for fever and chills.  Cardiovascular: Negative for chest pain.  Gastrointestinal: Positive for nausea and abdominal pain. Negative for vomiting, diarrhea, constipation and blood in stool.    Blood pressure 144/79,  pulse 69, temperature 97.9 F (36.6 C), temperature source Oral, resp. rate 18, SpO2 100 %. Physical Exam  Vitals reviewed. Constitutional: He appears well-developed and well-nourished.  Eyes: No scleral icterus.  Cardiovascular: Normal rate,  regular rhythm and normal heart sounds.   Respiratory: Effort normal and breath sounds normal. He has no wheezes. He has no rales.  GI: Soft. Bowel sounds are normal. He exhibits no distension. There is tenderness in the periumbilical area.       Assessment/Plan Incisional hernia  He has increased pain that might be related to partial obstruction on ct but is still having bowel function.  He took brilinta this am.  I think admission, npo and reevaluation in am is reasonable. He does not need urgent surgery. However I don't know that this will wait until the end of the month.  I will let Dr Dalbert Batman know. Will continue asa but not brilinta.    Wasif Simonich 04/23/2014, 11:23 PM

## 2014-04-23 NOTE — ED Notes (Signed)
Pt reports having a hernia and partial bowel blockage, has surgery scheduled for 1/26. Pt having increase in pain and feels like abd is bloated. Denies n/v/d.

## 2014-04-23 NOTE — ED Notes (Signed)
I Stat Lactic Acid results shown to [employer]

## 2014-04-23 NOTE — ED Notes (Signed)
EDP at bedside  

## 2014-04-24 ENCOUNTER — Other Ambulatory Visit: Payer: Self-pay

## 2014-04-24 ENCOUNTER — Inpatient Hospital Stay (HOSPITAL_COMMUNITY): Payer: BC Managed Care – PPO

## 2014-04-24 LAB — CBC WITH DIFFERENTIAL/PLATELET
Basophils Absolute: 0 10*3/uL (ref 0.0–0.1)
Basophils Relative: 1 % (ref 0–1)
EOS ABS: 0.4 10*3/uL (ref 0.0–0.7)
Eosinophils Relative: 6 % — ABNORMAL HIGH (ref 0–5)
HEMATOCRIT: 41.4 % (ref 39.0–52.0)
Hemoglobin: 14.4 g/dL (ref 13.0–17.0)
LYMPHS ABS: 1.7 10*3/uL (ref 0.7–4.0)
Lymphocytes Relative: 29 % (ref 12–46)
MCH: 30.3 pg (ref 26.0–34.0)
MCHC: 34.8 g/dL (ref 30.0–36.0)
MCV: 87 fL (ref 78.0–100.0)
MONOS PCT: 9 % (ref 3–12)
Monocytes Absolute: 0.5 10*3/uL (ref 0.1–1.0)
NEUTROS ABS: 3.2 10*3/uL (ref 1.7–7.7)
Neutrophils Relative %: 55 % (ref 43–77)
PLATELETS: 143 10*3/uL — AB (ref 150–400)
RBC: 4.76 MIL/uL (ref 4.22–5.81)
RDW: 13.7 % (ref 11.5–15.5)
WBC: 5.8 10*3/uL (ref 4.0–10.5)

## 2014-04-24 LAB — COMPREHENSIVE METABOLIC PANEL
ALT: 28 U/L (ref 0–53)
ANION GAP: 4 — AB (ref 5–15)
AST: 25 U/L (ref 0–37)
Albumin: 3.7 g/dL (ref 3.5–5.2)
Alkaline Phosphatase: 80 U/L (ref 39–117)
BILIRUBIN TOTAL: 0.9 mg/dL (ref 0.3–1.2)
BUN: 10 mg/dL (ref 6–23)
CO2: 28 mmol/L (ref 19–32)
CREATININE: 1.04 mg/dL (ref 0.50–1.35)
Calcium: 8.6 mg/dL (ref 8.4–10.5)
Chloride: 105 mEq/L (ref 96–112)
GFR calc Af Amer: >90 mL/min (ref >90)
GFR calc non Af Amer: 80 mL/min — ABNORMAL LOW (ref >90)
Glucose, Bld: 101 mg/dL — ABNORMAL HIGH (ref 70–99)
Potassium: 3.6 mmol/L (ref 3.5–5.1)
SODIUM: 137 mmol/L (ref 135–145)
Total Protein: 6.1 g/dL (ref 6.0–8.3)

## 2014-04-24 LAB — APTT: APTT: 25 s (ref 24–37)

## 2014-04-24 LAB — PROTIME-INR
INR: 1.04 (ref 0.00–1.49)
Prothrombin Time: 13.8 seconds (ref 11.6–15.2)

## 2014-04-24 LAB — SURGICAL PCR SCREEN
MRSA, PCR: NEGATIVE
Staphylococcus aureus: POSITIVE — AB

## 2014-04-24 MED ORDER — ONDANSETRON HCL 4 MG/2ML IJ SOLN
4.0000 mg | Freq: Four times a day (QID) | INTRAMUSCULAR | Status: DC | PRN
  Administered 2014-04-24: 4 mg via INTRAVENOUS
  Filled 2014-04-24: qty 2

## 2014-04-24 MED ORDER — HEPARIN SODIUM (PORCINE) 5000 UNIT/ML IJ SOLN
5000.0000 [IU] | Freq: Three times a day (TID) | INTRAMUSCULAR | Status: DC
  Administered 2014-04-24 – 2014-04-25 (×4): 5000 [IU] via SUBCUTANEOUS
  Filled 2014-04-24 (×7): qty 1

## 2014-04-24 MED ORDER — MUPIROCIN 2 % EX OINT
1.0000 | TOPICAL_OINTMENT | Freq: Two times a day (BID) | CUTANEOUS | Status: DC
Start: 2014-04-24 — End: 2014-04-25
  Administered 2014-04-24 – 2014-04-25 (×3): 1 via NASAL
  Filled 2014-04-24: qty 22

## 2014-04-24 MED ORDER — MORPHINE SULFATE 2 MG/ML IJ SOLN
2.0000 mg | INTRAMUSCULAR | Status: DC | PRN
Start: 2014-04-24 — End: 2014-04-25
  Administered 2014-04-24: 2 mg via INTRAVENOUS
  Filled 2014-04-24: qty 1

## 2014-04-24 MED ORDER — CHLORHEXIDINE GLUCONATE CLOTH 2 % EX PADS
6.0000 | MEDICATED_PAD | Freq: Every day | CUTANEOUS | Status: DC
  Administered 2014-04-24: 6 via TOPICAL

## 2014-04-24 MED ORDER — CHLORHEXIDINE GLUCONATE 4 % EX LIQD
1.0000 "application " | Freq: Once | CUTANEOUS | Status: DC
  Filled 2014-04-24: qty 15

## 2014-04-24 MED ORDER — ACETAMINOPHEN 650 MG RE SUPP: 650.0000 mg | Freq: Four times a day (QID) | RECTAL | Status: DC | PRN

## 2014-04-24 MED ORDER — SODIUM CHLORIDE 0.9 % IV SOLN
INTRAVENOUS | Status: DC
  Administered 2014-04-24: 02:00:00 via INTRAVENOUS

## 2014-04-24 MED ORDER — DEXTROSE 5 % IV SOLN
3.0000 g | INTRAVENOUS | Status: AC
  Filled 2014-04-24: qty 3000

## 2014-04-24 MED ORDER — ACETAMINOPHEN 325 MG PO TABS
650.0000 mg | ORAL_TABLET | Freq: Four times a day (QID) | ORAL | Status: DC | PRN
  Administered 2014-04-24: 650 mg via ORAL
  Filled 2014-04-24: qty 2

## 2014-04-24 MED ORDER — PANTOPRAZOLE SODIUM 40 MG IV SOLR
40.0000 mg | Freq: Every day | INTRAVENOUS | Status: DC
  Administered 2014-04-24: 40 mg via INTRAVENOUS
  Filled 2014-04-24 (×3): qty 40

## 2014-04-24 NOTE — Progress Notes (Signed)
Gen. surgery:  Patient feeling better this evening but still has some crampy discomfort. Tolerating liquid diet. Having bowel movements. Abdomen soft and hernia essentially reducible.   We discussed options for planning his eventual surgery. If we can advance to a soft diet and he continues to have bowel movements without increase in symptoms, he would like to go home and continue planning for elective surgery later this month. I will ask my scheduling staff if there is any [place] that the surgery can be scheduled sooner.    Edsel Petrin. Dalbert Batman, M.D., [facility] General and Minimally invasive Surgery Breast and Colorectal Surgery

## 2014-04-24 NOTE — Progress Notes (Signed)
Patient ID: Jacob Marks, male   DOB: 06/17/1960, 54 y.o.   MRN: [id number]    Subjective: Pt feels ok this morning.  Hungry.  Had some minimal nausea, but that resolved.  Pass flatus and stool yesterday.  Nothing today.    Objective: Vital signs in last 24 hours: Temp:  [97.8 F (36.6 C)-97.9 F (36.6 C)] 97.8 F (36.6 C) (01/05 0644) Pulse Rate:  [50-69] 55 (01/05 0644) Resp:  [18] 18 (01/05 0644) BP: (125-150)/(51-84) 128/78 mmHg (01/05 0644) SpO2:  [96 %-100 %] 97 % (01/05 0644)    Intake/Output from previous day:   Intake/Output this shift:    PE: Abd: soft, really not tender even over hernia which is soft, +BS, ND, obese Heart: regular Lungs: CTAB  Lab Results:   Recent Labs  04/23/14 1843 04/24/14 0128  WBC 6.6 5.8  HGB 15.5 14.4  HCT 45.2 41.4  PLT 166 143*   BMET  Recent Labs  04/23/14 1843 04/24/14 0128  NA 140 137  K 3.9 3.6  CL 108 105  CO2 26 28  GLUCOSE 107* 101*  BUN 12 10  CREATININE 1.04 1.04  CALCIUM 9.3 8.6   PT/INR  Recent Labs  04/24/14 0128  LABPROT 13.8  INR 1.04   CMP     Component Value Date/Time   NA 137 04/24/2014 0128   K 3.6 04/24/2014 0128   CL 105 04/24/2014 0128   CO2 28 04/24/2014 0128   GLUCOSE 101* 04/24/2014 0128   BUN 10 04/24/2014 0128   CREATININE 1.04 04/24/2014 0128   CREATININE 0.92 01/02/2011 1715   CALCIUM 8.6 04/24/2014 0128   PROT 6.1 04/24/2014 0128   ALBUMIN 3.7 04/24/2014 0128   AST 25 04/24/2014 0128   ALT 28 04/24/2014 0128   ALKPHOS 80 04/24/2014 0128   BILITOT 0.9 04/24/2014 0128   GFRNONAA 80* 04/24/2014 0128   GFRAA >90 04/24/2014 0128   Lipase     Component Value Date/Time   LIPASE 32 04/23/2014 1843       Studies/Results: Chest 2 View  04/24/2014   CLINICAL DATA:  Preoperative exam prior to hernia repair; history of asthma, hypertension, and coronary angioplasty  EXAM: CHEST  2 VIEW  COMPARISON:  PA and lateral chest of June 09, 2011  FINDINGS: The lungs are  adequately inflated. There is no focal infiltrate. The interstitial markings are mildly increased bilaterally. There is subsegmental atelectasis versus scarring in the posterior costophrenic gutter on the right. The heart and pulmonary vascularity are normal. There is no pleural effusion or pneumothorax. The bony thorax is unremarkable.  IMPRESSION: There are mildly increased interstitial markings diffusely which may be related to the patient's history of asthma. There is no evidence of CHF.   Electronically Signed   By: David  Martinique   On: 04/24/2014 08:09   Ct Abdomen Pelvis W Contrast  04/23/2014   CLINICAL DATA:  Pain at site of known ventral hernia, initial evaluation  EXAM: CT ABDOMEN AND PELVIS WITH CONTRAST  TECHNIQUE: Multidetector CT imaging of the abdomen and pelvis was performed using the standard protocol following bolus administration of intravenous contrast.  CONTRAST:  112mL OMNIPAQUE IOHEXOL 300 MG/ML  SOLN  COMPARISON:  None.  FINDINGS: Visualized portions of the lung bases are clear. Heart size is normal.  Mild to moderate hepatic steatosis. Several low-attenuation lesions in the left lobe of the liver, some too small to characterize but all likely represent cysts. The largest is about 1.5 cm and measures  Hounsfield attenuation value of 0. Spleen is normal except for 17 mm low-attenuation lesion inferiorly. Gallbladder is normal. Pancreas is normal.  Mild fullness left adrenal gland is suggesting probable hyperplasia. Minimal fullness right adrenal gland. Upper pole right renal lesion measures about 15 mm with average attenuation value of 11, most consistent with a cyst. Lower pole 18 mm lesion, exophytic, with average attenuation value of 56. Two stones lower pole right kidney, the larger measuring 5 mm, with no obstruction. 4 mm stone lower pole left kidney with no hydronephrosis.  Bladder is normal. Reproductive organs are normal. Large circumscribed fat containing mass anterior to the left  proximal thigh musculature, measuring 11 cm. This is not completely visualized but it is likely a lipoma.  Nonobstructive bowel gas pattern. Stomach is normal. Large bowel is normal. Appendix is not dilated with no evidence of inflammatory change around the appendix.  Anterior pelvic wall demonstrates a hernia to the right of midline. This contains a few loops of small bowel. The distal loop is decompressed while the other is mildly dilated at about 3.7 cm. There is mild hazy attenuation within the hernia sac, which measures about 8 cm. Distally small bowel is decompressed. Small bowel is regionally distended where involved with the hernia, but is decompressed both proximally and distally.  IMPRESSION: 1. Ventral hernia anterior right pelvic wall. Evidence of mild inflammation with focal bowel loop dilatation involving small bowel. Findings are concerning for possible strangulation and focal obstruction. Critical Value/emergent results were called by telephone at the time of interpretation on 04/23/2014 at 10:13 pm to [facility] , who verbally acknowledged these results. 2. 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 Upper pole right renal cyst. Lower pole indeterminate renal lesion. Consider renal ultrasound or MRI to further evaluate and exclude a solid mass. 4. Nonobstructing renal calculi 5. Nonspecific splenic mass statistically likely benign possibly a hemangioma. 6. Numerous liver lesions some too small to characterize, probably cysts. 7. Bilateral adrenal hyperplasia   Electronically Signed   By: Skipper Cliche M.D.   On: 04/23/2014 22:15    Anti-infectives: Anti-infectives    Start     Dose/Rate Route Frequency Ordered Stop   04/24/14 0029  ceFAZolin (ANCEF) 3 g in dextrose 5 % 50 mL IVPB     3 g160 mL/hr over 30 Minutes Intravenous On call to O.R. 04/24/14 0030 04/25/14 0559       Assessment/Plan  1. Chronically incarcerated incisional ventral hernia 2.  Extensive past surgical history 3. CAD, h/o STEMI, s/p cardiac stenting, on Brilinta/ASA 4. HTN 5. H/o CVA 6. Right kidney mass, Sees Dr. Michele Mcalpine (?) at [facility]: 1. Patient feels better today.  Hasn't taken any pain meds.  He is hungry.  Will try him on clear liquids and see how he does.  If he is able to tolerate a diet, then hopefully he can return home and plan for already scheduled hernia repair with Dr. Dalbert Batman; however, if he does not, then he will likely require surgical intervention while here 2. Brilinta is on hold. 3. Patient's cardiologist is in [facility], Dr. Brendia Sacks.  He saw him in November for his yearly check up and also gave him surgical clearance at that time.  His documentation of this is in our [employer] system at our office. 4. On SQ Heparin currently for DVT prophylaxis and ASA. 5. No further acute work up for kidney mass per [facility].  Will repeat CT scan in one year.  LOS: 1 day    Sante Biedermann E 04/24/2014, 9:49 AM Pager: [phone]

## 2014-04-25 MED ORDER — OXYCODONE-ACETAMINOPHEN 5-325 MG PO TABS: 1.0000 | ORAL_TABLET | Freq: Four times a day (QID) | ORAL | Status: DC | PRN

## 2014-04-25 NOTE — Plan of Care (Signed)
Problem: Food- and Nutrition-Related Knowledge Deficit (NB-1.1) Goal: Nutrition education Formal process to instruct or train a patient/client in a skill or to impart knowledge to help patients/clients voluntarily manage or modify food choices and eating behavior to maintain or improve health. Outcome: Completed/Met Date Met:  04/25/14 RD consulted for a soft diet (low fiber diet). Pt was given a handout "Low Fiber Nutrition Therapy". Discussed the importance of following this temporary diet. Discussed which foods are recommended and which he should limit or avoid. Recommended foods to be fully cooked and soft. Recommended to avoid foods at are >2 grams of fiber per serving. Emphasized the importance of adequate protein intake. Teach back method used.  Expect good compliance.  Pt is currently on a soft diet with meal completion of 100%. Labs and medications reviewed. No nutrition intervention at this time. If nutritional issues arise, re-consult dietitian.  Kallie Locks, MS, RD, LDN Pager # [phone] After hours/ weekend pager # [phone]

## 2014-04-25 NOTE — Discharge Summary (Signed)
[facility] Discharge Summary   Patient ID: Jacob Marks MRN: [id number] DOB/AGE: December 02, 1960 54 y.o.  Admit date: 04/23/2014 Discharge date: 04/25/2014  Admitting Diagnosis: Chronic incisional hernia Incarcerated incisional hernia  Discharge Diagnosis Patient Active Problem List   Diagnosis Date Noted  . Incarcerated incisional hernia 04/23/2014  . Erectile dysfunction 02/21/2014  . Allergic rhinitis 09/04/2013  . Vertigo, benign paroxysmal 09/04/2013  . STEMI (ST elevation myocardial infarction) 08/12/2012  . Chest pain 03/21/2012  . GERD (gastroesophageal reflux disease) 03/21/2012  . Dyspnea 06/09/2011  . Post-nasal drip 06/09/2011  . Kidney stone 06/09/2011  . Anxiety 06/09/2011  . Dysphagia 03/19/2011  . Diverticulosis 01/04/2011  . Wound infection after surgery 01/04/2011  . CVA (cerebral infarction) 01/02/2011  . Depression 01/02/2011  . Anemia 01/02/2011  . Fatigue 08/05/2010  . Prostate cancer screening 08/05/2010  . TINEA VERSICOLOR 01/15/2010  . EYE FLOATERS 01/15/2010  . ONYCHIA AND PARONYCHIA OF FINGER 11/02/2008  . 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 Testosterone deficiency 11/16/2007  . HYPERTENSION, CONTROLLED 11/16/2007  . SLEEP APNEA 11/16/2007  . 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 Hyperlipidemia 05/28/2003    Consultants None  Imaging: Chest 2 View  04/24/2014   CLINICAL DATA:  Preoperative exam prior to hernia repair; history of asthma, hypertension, and coronary angioplasty  EXAM: CHEST  2 VIEW  COMPARISON:  PA and lateral chest of June 09, 2011  FINDINGS: The lungs are adequately inflated. There is no focal infiltrate. The interstitial markings are mildly increased bilaterally. There is subsegmental atelectasis versus scarring in the posterior costophrenic gutter on the right. The heart and pulmonary vascularity are normal. There is no pleural effusion or pneumothorax. The bony thorax is unremarkable.   IMPRESSION: There are mildly increased interstitial markings diffusely which may be related to the patient's history of asthma. There is no evidence of CHF.   Electronically Signed   By: David  Martinique   On: 04/24/2014 08:09   Ct Abdomen Pelvis W Contrast  04/23/2014   CLINICAL DATA:  Pain at site of known ventral hernia, initial evaluation  EXAM: CT ABDOMEN AND PELVIS WITH CONTRAST  TECHNIQUE: Multidetector CT imaging of the abdomen and pelvis was performed using the standard protocol following bolus administration of intravenous contrast.  CONTRAST:  180mL OMNIPAQUE IOHEXOL 300 MG/ML  SOLN  COMPARISON:  None.  FINDINGS: Visualized portions of the lung bases are clear. Heart size is normal.  Mild to moderate hepatic steatosis. Several low-attenuation lesions in the left lobe of the liver, some too small to characterize but all likely represent cysts. The largest is about 1.5 cm and measures Hounsfield attenuation value of 0. Spleen is normal except for 17 mm low-attenuation lesion inferiorly. Gallbladder is normal. Pancreas is normal.  Mild fullness left adrenal gland is suggesting probable hyperplasia. Minimal fullness right adrenal gland. Upper pole right renal lesion measures about 15 mm with average attenuation value of 11, most consistent with a cyst. Lower pole 18 mm lesion, exophytic, with average attenuation value of 56. Two stones lower pole right kidney, the larger measuring 5 mm, with no obstruction. 4 mm stone lower pole left kidney with no hydronephrosis.  Bladder is normal. Reproductive organs are normal. Large circumscribed fat containing mass anterior to the left proximal thigh musculature, measuring 11 cm. This is not completely visualized but it is likely a lipoma.  Nonobstructive bowel gas pattern. Stomach is normal. Large bowel is normal. Appendix is not dilated with no evidence of inflammatory change  around the appendix.  Anterior pelvic wall demonstrates a hernia to the right of midline. This  contains a few loops of small bowel. The distal loop is decompressed while the other is mildly dilated at about 3.7 cm. There is mild hazy attenuation within the hernia sac, which measures about 8 cm. Distally small bowel is decompressed. Small bowel is regionally distended where involved with the hernia, but is decompressed both proximally and distally.  IMPRESSION: 1. Ventral hernia anterior right pelvic wall. Evidence of mild inflammation with focal bowel loop dilatation involving small bowel. Findings are concerning for possible strangulation and focal obstruction. Critical Value/emergent results were called by telephone at the time of interpretation on 04/23/2014 at 10:13 pm to [facility] , who verbally acknowledged these results. 2. 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 Upper pole right renal cyst. Lower pole indeterminate renal lesion. Consider renal ultrasound or MRI to further evaluate and exclude a solid mass. 4. Nonobstructing renal calculi 5. Nonspecific splenic mass statistically likely benign possibly a hemangioma. 6. Numerous liver lesions some too small to characterize, probably cysts. 7. Bilateral adrenal hyperplasia   Electronically Signed   By: Skipper Cliche M.D.   On: 04/23/2014 22:15    Procedures None   Hospital Course:  54 year old male who presented with an incisional hernia to Dr Dalbert Batman in our office. The beginning of this note is from his note recently. He is scheduled for surgery at the end of this month for open vh repair. He has been seen by his cardiologist and recommendation for continuing asa and stopping brilinta (which he took this am). On October 20, 2010 he underwent left hemicolectomy with anastomosis and takedown of splenic flexure. On October 29, 2010 he underwent reexploration because of large intra-abdominal hematoma and placement of negative pressure dressing On October 31, 2010 he underwent reexploration, lysis of adhesions and  ileostomy/ On November 25, 2010 he underwent incision and drainage of abdominal wall abscess On March 2013 he underwent closure of ileostomy at [facility]. Fortunately he was able to do this through the right lower quadrant incision and pull up the old distal limb. He is followed by a urologist at [facility] who has stated that he does not need any further intervention for his tiny right kidney mass. He has follow-up in 1 year CT scan was performed on February 26, 2014 which shows the ventral hernia slightly to the right of the midline in the periumbilical area with small bowel in it. Multiple small bowel anastomoses were noted. Diverticulosis of the descending colon was noted. Bilateral lower pole nonobstructing kidney stones were noted. Ejection fraction recently was 55% with normal right ventricular systolic pressures and no significant wall motion abnormalities. Note was made of his STEMI on August 05, 2012 with occluded distal RCA. Drug-eluting stent was performed. Since NYE now he has increasing pain. He has some nausea, no emesis. He still is passing flatus and having bms though. He comes in today due to continued pain.  Patient was admitted and was transferred to the floor for pain control and conservative management with bowel rest.  Pain and distension soon resolved and diet was advanced as tolerated.  On HD #3, the patient was voiding well, tolerating diet, ambulating well, pain well controlled, vital signs stable, and felt stable for discharge home.  Patient will follow up to have surgery with Dr. Dalbert Batman in a few weeks, he will arrange time and date.  He knows to call  with questions or concerns.   He is encouraged to stay on a soft/liquid diet and prevent constipation.     Medication List    TAKE these medications        aspirin EC 81 MG tablet  Take 1 tablet (81 mg total) by mouth daily.     atorvastatin 80 MG tablet  Commonly known as:  LIPITOR  Take 1 tablet (80 mg total) by mouth  daily at 6 PM.     lisinopril 10 MG tablet  Commonly known as:  PRINIVIL,ZESTRIL  TAKE ONE TABLET BY MOUTH ONCE DAILY     metoprolol tartrate 25 MG tablet  Commonly known as:  LOPRESSOR  Take 1 tablet (25 mg total) by mouth 2 (two) times daily.     mometasone 50 MCG/ACT nasal spray  Commonly known as:  NASONEX  Place 2 sprays into the nose daily.     oxyCODONE-acetaminophen 5-325 MG per tablet  Commonly known as:  ROXICET  Take 1 tablet by mouth every 6 (six) hours as needed for severe pain.     pyridoxine 100 MG tablet  Commonly known as:  B-6  Take 100 mg by mouth daily.     tadalafil 5 MG tablet  Commonly known as:  CIALIS  Take 1 tablet (5 mg total) by mouth daily as needed for erectile dysfunction.     ticagrelor 60 MG Tabs tablet  Commonly known as:  BRILINTA  Take 1 tablet (60 mg total) by mouth 2 (two) times daily.     vitamin B-12 500 MCG tablet  Commonly known as:  CYANOCOBALAMIN  Take 500 mcg by mouth daily.     vitamin C 1000 MG tablet  Take 1,000 mg by mouth daily.           Signed: Coralie Keens, [facility] [phone]  04/25/2014, 3:39 PM

## 2014-04-25 NOTE — Progress Notes (Signed)
Orthopedic Tech Progress Note Patient Details:  Jacob Marks [facility] [birth year] [id number]  Ortho Devices Type of Ortho Device: Abdominal binder Ortho Device/Splint Location: abdomen Ortho Device/Splint Interventions: Ordered Viewed order from doctor's order list  Hildred Priest 04/25/2014, 11:43 AM

## 2014-04-25 NOTE — Progress Notes (Signed)
Utilization review complete. Adair Lauderback RN CCM Case Mgmt phone [phone] 

## 2014-04-25 NOTE — Progress Notes (Signed)
[facility] Progress Note     Subjective: Pt feels great.  No N/V or abdominal pain.  Says distension resolved and hernia doesn't hurt.  Wants solid food.  Wants to go home.    Objective: Vital signs in last 24 hours: Temp:  [97.9 F (36.6 C)-98.6 F (37 C)] 98.6 F (37 C) (01/06 0814) Pulse Rate:  [54-59] 59 (01/06 0642) Resp:  [18-20] 18 (01/06 0642) BP: (124-142)/(62-80) 142/80 mmHg (01/06 0642) SpO2:  [96 %-98 %] 96 % (01/06 0642) Last BM Date: 04/23/14  Intake/Output from previous day: 01/05 0701 - 01/06 0700 In: 3925 [P.O.:1280; I.V.:2645] Out: 700 [Urine:700] Intake/Output this shift:    PE: Gen:  Alert, NAD, pleasant Abd: Soft, NT/ND, +BS, no HSM, abdominal scars noted, hernia reducible   Lab Results:   Recent Labs  04/23/14 1843 04/24/14 0128  WBC 6.6 5.8  HGB 15.5 14.4  HCT 45.2 41.4  PLT 166 143*   BMET  Recent Labs  04/23/14 1843 04/24/14 0128  NA 140 137  K 3.9 3.6  CL 108 105  CO2 26 28  GLUCOSE 107* 101*  BUN 12 10  CREATININE 1.04 1.04  CALCIUM 9.3 8.6   PT/INR  Recent Labs  04/24/14 0128  LABPROT 13.8  INR 1.04   CMP     Component Value Date/Time   NA 137 04/24/2014 0128   K 3.6 04/24/2014 0128   CL 105 04/24/2014 0128   CO2 28 04/24/2014 0128   GLUCOSE 101* 04/24/2014 0128   BUN 10 04/24/2014 0128   CREATININE 1.04 04/24/2014 0128   CREATININE 0.92 01/02/2011 1715   CALCIUM 8.6 04/24/2014 0128   PROT 6.1 04/24/2014 0128   ALBUMIN 3.7 04/24/2014 0128   AST 25 04/24/2014 0128   ALT 28 04/24/2014 0128   ALKPHOS 80 04/24/2014 0128   BILITOT 0.9 04/24/2014 0128   GFRNONAA 80* 04/24/2014 0128   GFRAA >90 04/24/2014 0128   Lipase     Component Value Date/Time   LIPASE 32 04/23/2014 1843       Studies/Results: Chest 2 View  04/24/2014   CLINICAL DATA:  Preoperative exam prior to hernia repair; history of asthma, hypertension, and coronary angioplasty  EXAM: CHEST  2 VIEW  COMPARISON:  PA and lateral  chest of June 09, 2011  FINDINGS: The lungs are adequately inflated. There is no focal infiltrate. The interstitial markings are mildly increased bilaterally. There is subsegmental atelectasis versus scarring in the posterior costophrenic gutter on the right. The heart and pulmonary vascularity are normal. There is no pleural effusion or pneumothorax. The bony thorax is unremarkable.  IMPRESSION: There are mildly increased interstitial markings diffusely which may be related to the patient's history of asthma. There is no evidence of CHF.   Electronically Signed   By: David  Martinique   On: 04/24/2014 08:09   Ct Abdomen Pelvis W Contrast  04/23/2014   CLINICAL DATA:  Pain at site of known ventral hernia, initial evaluation  EXAM: CT ABDOMEN AND PELVIS WITH CONTRAST  TECHNIQUE: Multidetector CT imaging of the abdomen and pelvis was performed using the standard protocol following bolus administration of intravenous contrast.  CONTRAST:  132mL OMNIPAQUE IOHEXOL 300 MG/ML  SOLN  COMPARISON:  None.  FINDINGS: Visualized portions of the lung bases are clear. Heart size is normal.  Mild to moderate hepatic steatosis. Several low-attenuation lesions in the left lobe of the liver, some too small to characterize but all likely represent cysts. The largest is about 1.5  cm and measures Hounsfield attenuation value of 0. Spleen is normal except for 17 mm low-attenuation lesion inferiorly. Gallbladder is normal. Pancreas is normal.  Mild fullness left adrenal gland is suggesting probable hyperplasia. Minimal fullness right adrenal gland. Upper pole right renal lesion measures about 15 mm with average attenuation value of 11, most consistent with a cyst. Lower pole 18 mm lesion, exophytic, with average attenuation value of 56. Two stones lower pole right kidney, the larger measuring 5 mm, with no obstruction. 4 mm stone lower pole left kidney with no hydronephrosis.  Bladder is normal. Reproductive organs are normal. Large  circumscribed fat containing mass anterior to the left proximal thigh musculature, measuring 11 cm. This is not completely visualized but it is likely a lipoma.  Nonobstructive bowel gas pattern. Stomach is normal. Large bowel is normal. Appendix is not dilated with no evidence of inflammatory change around the appendix.  Anterior pelvic wall demonstrates a hernia to the right of midline. This contains a few loops of small bowel. The distal loop is decompressed while the other is mildly dilated at about 3.7 cm. There is mild hazy attenuation within the hernia sac, which measures about 8 cm. Distally small bowel is decompressed. Small bowel is regionally distended where involved with the hernia, but is decompressed both proximally and distally.  IMPRESSION: 1. Ventral hernia anterior right pelvic wall. Evidence of mild inflammation with focal bowel loop dilatation involving small bowel. Findings are concerning for possible strangulation and focal obstruction. Critical Value/emergent results were called by telephone at the time of interpretation on 04/23/2014 at 10:13 pm to [facility] , who verbally acknowledged these results. 2. 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 Upper pole right renal cyst. Lower pole indeterminate renal lesion. Consider renal ultrasound or MRI to further evaluate and exclude a solid mass. 4. Nonobstructing renal calculi 5. Nonspecific splenic mass statistically likely benign possibly a hemangioma. 6. Numerous liver lesions some too small to characterize, probably cysts. 7. Bilateral adrenal hyperplasia   Electronically Signed   By: Skipper Cliche M.D.   On: 04/23/2014 22:15    Anti-infectives: Anti-infectives    Start     Dose/Rate Route Frequency Ordered Stop   04/24/14 0029  ceFAZolin (ANCEF) 3 g in dextrose 5 % 50 mL IVPB     3 g160 mL/hr over 30 Minutes Intravenous On call to O.R. 04/24/14 0030 04/25/14 0559       Assessment/Plan 1.  Chronically incarcerated incisional ventral hernia 2. Extensive past surgical history 3. CAD, h/o STEMI, s/p cardiac stenting, on Brilinta/ASA 4. HTN 5. H/o CVA 6. Right kidney mass, Sees Dr. Michele Mcalpine (?) at [facility]: 1. Patient progressing will advance diet.  If he tolerates solid food today then he can be discharged home today to follow up with Dr. Dalbert Batman at the end of the month for surgery. 2. Resume brilinta at discharge, and patient instructed to hold it per cardiology and Dr. Dalbert Batman prior to surgery. 3. Patient's cardiologist is in [facility], Dr. Brendia Sacks. He saw him in November for his yearly check up and also gave him surgical clearance at that time. His documentation of this is in our [employer] system at our office. 4. On SQ Heparin currently for DVT prophylaxis and ASA. 5. No further acute work up for kidney mass per [facility]. Will repeat CT scan in one year. 6. Hopefully home this afternoon   LOS: 2 days    DORT, Tyquasia Pant 04/25/2014, 9:41 AM Pager:  [phone]   

## 2014-04-26 ENCOUNTER — Other Ambulatory Visit (INDEPENDENT_AMBULATORY_CARE_PROVIDER_SITE_OTHER): Payer: Self-pay | Admitting: General Surgery

## 2014-05-04 ENCOUNTER — Encounter (HOSPITAL_COMMUNITY)
Admission: RE | Admit: 2014-05-04 | Discharge: 2014-05-04 | Disposition: A | Discharge status: Home / Self Care | Payer: BC Managed Care – PPO | Source: Ambulatory Visit | Attending: General Surgery | Admitting: General Surgery

## 2014-05-04 ENCOUNTER — Encounter (HOSPITAL_COMMUNITY): Payer: Self-pay

## 2014-05-04 DIAGNOSIS — Z01812 Encounter for preprocedural laboratory examination: Secondary | ICD-10-CM

## 2014-05-04 DIAGNOSIS — K5669 Other intestinal obstruction: Secondary | ICD-10-CM | POA: Insufficient documentation

## 2014-05-04 HISTORY — DX: Chronic kidney disease, unspecified: N18.9

## 2014-05-04 LAB — COMPREHENSIVE METABOLIC PANEL
ALBUMIN: 3.9 g/dL (ref 3.5–5.2)
ALK PHOS: 93 U/L (ref 39–117)
ALT: 30 U/L (ref 0–53)
AST: 29 U/L (ref 0–37)
Anion gap: 11 (ref 5–15)
BUN: 18 mg/dL (ref 6–23)
CHLORIDE: 105 meq/L (ref 96–112)
CO2: 26 mmol/L (ref 19–32)
CREATININE: 1.07 mg/dL (ref 0.50–1.35)
Calcium: 9.6 mg/dL (ref 8.4–10.5)
GFR calc Af Amer: 90 mL/min — ABNORMAL LOW (ref >90)
GFR calc non Af Amer: 77 mL/min — ABNORMAL LOW (ref >90)
GLUCOSE: 147 mg/dL — AB (ref 70–99)
Potassium: 4.1 mmol/L (ref 3.5–5.1)
SODIUM: 142 mmol/L (ref 135–145)
TOTAL PROTEIN: 6.6 g/dL (ref 6.0–8.3)
Total Bilirubin: 0.7 mg/dL (ref 0.3–1.2)

## 2014-05-04 LAB — URINALYSIS, ROUTINE W REFLEX MICROSCOPIC
Bilirubin Urine: NEGATIVE
Glucose, UA: NEGATIVE mg/dL
Hgb urine dipstick: NEGATIVE
Ketones, ur: NEGATIVE mg/dL
Leukocytes, UA: NEGATIVE
NITRITE: NEGATIVE
PROTEIN: NEGATIVE mg/dL
SPECIFIC GRAVITY, URINE: 1.022 (ref 1.005–1.030)
Urobilinogen, UA: 1 mg/dL (ref 0.0–1.0)
pH: 6 (ref 5.0–8.0)

## 2014-05-04 LAB — CBC WITH DIFFERENTIAL/PLATELET
BASOS ABS: 0 10*3/uL (ref 0.0–0.1)
Basophils Relative: 1 % (ref 0–1)
EOS PCT: 6 % — AB (ref 0–5)
Eosinophils Absolute: 0.4 10*3/uL (ref 0.0–0.7)
HCT: 44.4 % (ref 39.0–52.0)
Hemoglobin: 15.4 g/dL (ref 13.0–17.0)
Lymphocytes Relative: 29 % (ref 12–46)
Lymphs Abs: 1.8 10*3/uL (ref 0.7–4.0)
MCH: 30.7 pg (ref 26.0–34.0)
MCHC: 34.7 g/dL (ref 30.0–36.0)
MCV: 88.6 fL (ref 78.0–100.0)
Monocytes Absolute: 0.5 10*3/uL (ref 0.1–1.0)
Monocytes Relative: 9 % (ref 3–12)
NEUTROS ABS: 3.4 10*3/uL (ref 1.7–7.7)
Neutrophils Relative %: 55 % (ref 43–77)
Platelets: 185 10*3/uL (ref 150–400)
RBC: 5.01 MIL/uL (ref 4.22–5.81)
RDW: 13.8 % (ref 11.5–15.5)
WBC: 6 10*3/uL (ref 4.0–10.5)

## 2014-05-04 LAB — PROTIME-INR
INR: 0.98 (ref 0.00–1.49)
Prothrombin Time: 13.1 seconds (ref 11.6–15.2)

## 2014-05-04 LAB — SURGICAL PCR SCREEN
MRSA, PCR: NEGATIVE
Staphylococcus aureus: NEGATIVE

## 2014-05-04 LAB — APTT: aPTT: 25 seconds (ref 24–37)

## 2014-05-04 NOTE — H&P (Signed)
Jacob Marks  Location: [facility] Patient #: [id number] DOB: 07-May-1960 Married / Language: English / Race: White Male       History of Present Illness    The patient is a 54 year old male who presents with an incisional hernia. This gentleman returns with his wife to discuss surgical options for repair of his recurrent incisional hernia. Please see my office note dated March 13, 2014. I have reviewed his records from [place]. On October 20, 2010 he underwent left hemicolectomy with anastomosis and takedown of splenic flexure. On October 29, 2010 he underwent reexploration because of large intra-abdominal hematoma and placement of negative pressure dressing On October 31, 2010 he underwent reexploration, lysis of adhesions and ileostomy On November 25, 2010 he underwent incision and drainage of abdominal wall abscess On March 2013 he underwent closure of ileostomy by Dr. Morton Stall at [facility]. Fortunately he was able to do this through the right lower quadrant incision and pull up the old distal limb. He is followed by a urologist at [facility] who has stated that he does not need any further intervention for his tiny right kidney mass. He has follow-up in 1 year CT scan was performed on February 26, 2014 which shows the ventral hernia slightly to the right of the midline in the periumbilical area with small bowel in it. Multiple small bowel anastomoses were noted. Diverticulosis of the descending colon was noted. Bilateral lower pole nonobstructing kidney stones were noted. He has been evaluated by Dr. Ida Rogue at [facility] cardiology. He was seen on February 21, 2014. He was noted to be asymptomatic. Ejection fraction recently was 55% with normal right ventricular systolic pressures and no significant wall motion abnormalities. Note was made of his STEMI on August 05, 2012 with occluded distal RCA. Drug-eluting stent was placed and thrombectomy performed. No other  significant disease was noted. We were advised that we could discontinue his Arby Barrette to preoperative chart to continue the aspirin. I have had a very long talk with the patient and his wife. He knows this will be a large midline incision with removal of the umbilicus. He knows that there is increased risk for bleeding, injury to the intestine, postoperative sepsis and complications, at least 58% risk of recurrence of the hernia. He also knows the risk of cardiac pulmonary and thromboembolic problems. He understands all of these issues. All of his questions were answered. He requested that we do the surgery here in [place] and accepts these risks. I have advised him that surgery is advisable because of his recent episode of incarcerated hernia and SBO. I have advised him to try to lose 2-3 pounds a week and to walk 30 minutes a day between now and the surgery in January. He states that he will try.   Allergies No Known Drug Allergies11/24/2015  Medication History  Brilinta (90MG  Tablet, Oral) Active. Metoprolol Tartrate (25MG  Tablet, Oral) Active. Atorvastatin Calcium (80MG  Tablet, Oral) Active. Lisinopril (10MG  Tablet, Oral) Active. Aspirin EC (325MG  Tablet DR, Oral) Active. Vitamin B6 (200MG  Tablet, Oral) Active.  Vitals  04/03/2014 1:31 PM Weight: 264 lb Height: 69in Body Surface Area: 2.41 m Body Mass Index: 38.99 kg/m Temp.: 97.27F(Temporal)  Pulse: 81 (Regular)  BP: 130/82 (Sitting, Left Arm, Standard)    Physical Exam  General Mental Status-Alert. General Appearance-Consistent with stated age. Hydration-Well hydrated. Voice-Normal.  Head and Neck Head-normocephalic, atraumatic with no lesions or palpable masses. Trachea-midline. Thyroid Gland Characteristics - normal size and consistency.  Eye Eyeball - Bilateral-Extraocular movements intact. Sclera/Conjunctiva - Bilateral-No scleral icterus.  Chest and Lung Exam Chest  and lung exam reveals -quiet, even and easy respiratory effort with no use of accessory muscles and on auscultation, normal breath sounds, no adventitious sounds and normal vocal resonance. Inspection Chest Wall - Normal. Back - normal.  Breast Breast - Left-Symmetric, Non Tender, No Biopsy scars, no Dimpling, No Inflammation, No Lumpectomy scars, No Mastectomy scars, No Peau d' Orange. Breast - Right-Symmetric, Non Tender, No Biopsy scars, no Dimpling, No Inflammation, No Lumpectomy scars, No Mastectomy scars, No Peau d' Orange. Breast Lump-No Palpable Breast Mass.  Cardiovascular Cardiovascular examination reveals -normal heart sounds, regular rate and rhythm with no murmurs and normal pedal pulses bilaterally.  Abdomen Palpation/Percussion Palpation and Percussion of the abdomen reveal - Soft, Non Tender, No Rebound tenderness, No Rigidity (guarding) and No hepatosplenomegaly. Auscultation Auscultation of the abdomen reveals - Bowel sounds normal. Note: Abdomen is obese and there is a small panniculus. There is a long midline scar both above and below the umbilicus. There is a hernia in the umbilicus and also a partially reducible hernia below then to the right consistent with the CT scan findings. Old ileostomy scar on the right side but no hernia noted there.   Neurologic Neurologic evaluation reveals -alert and oriented x 3 with no impairment of recent or remote memory. Mental Status-Normal.  Musculoskeletal Normal Exam - Left-Upper Extremity Strength Normal and Lower Extremity Strength Normal. Normal Exam - Right-Upper Extremity Strength Normal and Lower Extremity Strength Normal.  Lymphatic Head & Neck  General Head & Neck Lymphatics: Bilateral - Description - Normal. Axillary  General Axillary Region: Bilateral - Description - Normal. Tenderness - Non Tender. Femoral & Inguinal  Generalized Femoral & Inguinal Lymphatics: Bilateral - Description -  Normal. Tenderness - Non Tender.    Assessment & Plan  OBSTRUCTED INCISIONAL HERNIA (552.21  K43.0) Current Plans  Schedule for Surgery You will be scheduled for open repair of your recurrent incisional hernia with mesh. This is appropriate because of your recent episode of bowel obstruction. You have stated that your urologist does not plan any further intervention for your small right kidney mass Discontinue Brilinta 60 hours prior to surgery Do not take aspirin the day of surgery you have been offered a second opinion and referral [facility] the [facility], and you have stated that you would prefer to have surgery in [place]. We have discussed the techniques of surgery and the numerous risks in detail. You are at increased risks because of the complexity of the surgery, obesity and your other medical conditions.  HYPERTENSION, BENIGN (401.1  I10) HISTORY OF ILEOSTOMY (V45.89  Z98.89) HISTORY OF COLECTOMY (V45.89  Z90.49) RIGHT KIDNEY MASS (593.9  N28.89) SLEEP APNEA IN ADULT (327.23  G47.33) HISTORY OF CVA IN ADULTHOOD (V12.54  Z86.73) BMI 39.0-39.9,ADULT (V85.39  Z68.39) HISTORY OF MYOCARDIAL INFARCTION IN ADULTHOOD (412  I25.2)   Edsel Petrin. Dalbert Batman, M.D., [facility] General and Minimally invasive Surgery Breast and Colorectal Surgery Office:   [phone] Pager:   [phone]

## 2014-05-04 NOTE — Pre-Procedure Instructions (Signed)
Jacob Marks  05/04/2014   Your procedure is scheduled on:   Monday May 07, 2014 1330 PM  Report to [facility] Admitting at 1130 AM.  Call this number if you have problems the morning of surgery: [phone]   Remember:   Do not eat food or drink liquids after midnight Sunday   Take these medicines the morning of surgery with A SIP OF WATER: Metoprolol,and pain med if needed.  Stop Aspirin , Brilinta, vitamins, and Nsaids 3 days prior to surgery.   Do not wear jewelry.  Do not wear lotions, powders, or perfumes. You may wear deodorant.             Men may shave face and neck.  Do not bring valuables to the hospital.  [facility] is not responsible                  for any belongings or valuables.               Contacts, dentures or bridgework may not be worn into surgery.  Leave suitcase in the car. After surgery it may be brought to your room.  For patients admitted to the hospital, discharge time is determined by your                treatment team.               Patients discharged the day of surgery will not be allowed to drive  home.    Special Instructions: [place] - Preparing for Surgery  Before surgery, you can play an important role.  Because skin is not sterile, your skin needs to be as free of germs as possible.  You can reduce the number of germs on you skin by washing with CHG (chlorahexidine gluconate) soap before surgery.  CHG is an antiseptic cleaner which kills germs and bonds with the skin to continue killing germs even after washing.  Please DO NOT use if you have an allergy to CHG or antibacterial soaps.  If your skin becomes reddened/irritated stop using the CHG and inform your nurse when you arrive at Short Stay.  Do not shave (including legs and underarms) for at least 48 hours prior to the first CHG shower.  You may shave your face.  Please follow these instructions carefully:   1.  Shower with CHG Soap the night before surgery and the                                 morning of Surgery.  2.  If you choose to wash your hair, wash your hair first as usual with your       normal shampoo.  3.  After you shampoo, rinse your hair and body thoroughly to remove the                      Shampoo.  4.  Use CHG as you would any other liquid soap.  You can apply chg directly       to the skin and wash gently with scrungie or a clean washcloth.  5.  Apply the CHG Soap to your body ONLY FROM THE NECK DOWN.        Do not use on open wounds or open sores.  Avoid contact with your eyes,       ears, mouth and genitals (private  parts).  Wash genitals (private parts)       with your normal soap.  6.  Wash thoroughly, paying special attention to the area where your surgery        will be performed.  7.  Thoroughly rinse your body with warm water from the neck down.  8.  DO NOT shower/wash with your normal soap after using and rinsing off       the CHG Soap.  9.  Pat yourself dry with a clean towel.            10.  Wear clean pajamas.            11.  Place clean sheets on your bed the night of your first shower and do not        sleep with pets.  Day of Surgery  Do not apply any lotions/deoderants the morning of surgery.  Please wear clean clothes to the hospital/surgery center.      Please read over the following fact sheets that you were given: Pain Booklet, Coughing and Deep Breathing, MRSA Information and Surgical Site Infection Prevention

## 2014-05-06 MED ORDER — VANCOMYCIN HCL 10 G IV SOLR
1500.0000 mg | INTRAVENOUS | Status: DC
  Filled 2014-05-06: qty 1500

## 2014-05-06 MED ORDER — CHLORHEXIDINE GLUCONATE 4 % EX LIQD
1.0000 "application " | Freq: Once | CUTANEOUS | Status: DC
  Filled 2014-05-06: qty 15

## 2014-05-07 ENCOUNTER — Inpatient Hospital Stay (HOSPITAL_COMMUNITY)
Admission: RE | Admit: 2014-05-07 | Discharge: 2014-05-12 | DRG: 337 | Disposition: A | Discharge status: Home / Self Care | Payer: BC Managed Care – PPO | Source: Ambulatory Visit | Attending: General Surgery | Admitting: General Surgery

## 2014-05-07 ENCOUNTER — Encounter (HOSPITAL_COMMUNITY): Payer: Self-pay | Admitting: Certified Registered"

## 2014-05-07 ENCOUNTER — Inpatient Hospital Stay (HOSPITAL_COMMUNITY): Payer: BC Managed Care – PPO | Admitting: Anesthesiology

## 2014-05-07 ENCOUNTER — Encounter (HOSPITAL_COMMUNITY)
Admission: RE | Disposition: A | Discharge status: Home / Self Care | Payer: Self-pay | Source: Ambulatory Visit | Attending: General Surgery

## 2014-05-07 DIAGNOSIS — E785 Hyperlipidemia, unspecified: Secondary | ICD-10-CM | POA: Diagnosis present

## 2014-05-07 DIAGNOSIS — G4730 Sleep apnea, unspecified: Secondary | ICD-10-CM | POA: Diagnosis present

## 2014-05-07 DIAGNOSIS — K430 Incisional hernia with obstruction, without gangrene: Secondary | ICD-10-CM

## 2014-05-07 DIAGNOSIS — E349 Endocrine disorder, unspecified: Secondary | ICD-10-CM | POA: Diagnosis present

## 2014-05-07 DIAGNOSIS — I252 Old myocardial infarction: Secondary | ICD-10-CM | POA: Diagnosis not present

## 2014-05-07 DIAGNOSIS — I25118 Atherosclerotic heart disease of native coronary artery with other forms of angina pectoris: Secondary | ICD-10-CM

## 2014-05-07 DIAGNOSIS — Z7901 Long term (current) use of anticoagulants: Secondary | ICD-10-CM

## 2014-05-07 DIAGNOSIS — K660 Peritoneal adhesions (postprocedural) (postinfection): Secondary | ICD-10-CM | POA: Diagnosis present

## 2014-05-07 DIAGNOSIS — Z8673 Personal history of transient ischemic attack (TIA), and cerebral infarction without residual deficits: Secondary | ICD-10-CM

## 2014-05-07 DIAGNOSIS — I2510 Atherosclerotic heart disease of native coronary artery without angina pectoris: Secondary | ICD-10-CM | POA: Diagnosis present

## 2014-05-07 DIAGNOSIS — Z79899 Other long term (current) drug therapy: Secondary | ICD-10-CM

## 2014-05-07 DIAGNOSIS — E669 Obesity, unspecified: Secondary | ICD-10-CM | POA: Diagnosis present

## 2014-05-07 DIAGNOSIS — Z6839 Body mass index (BMI) 39.0-39.9, adult: Secondary | ICD-10-CM | POA: Diagnosis not present

## 2014-05-07 DIAGNOSIS — Z793 Long term (current) use of hormonal contraceptives: Secondary | ICD-10-CM

## 2014-05-07 DIAGNOSIS — I10 Essential (primary) hypertension: Secondary | ICD-10-CM | POA: Diagnosis present

## 2014-05-07 DIAGNOSIS — E291 Testicular hypofunction: Secondary | ICD-10-CM | POA: Diagnosis present

## 2014-05-07 DIAGNOSIS — K432 Incisional hernia without obstruction or gangrene: Secondary | ICD-10-CM | POA: Diagnosis present

## 2014-05-07 HISTORY — DX: Obstructive sleep apnea (adult) (pediatric): G47.33

## 2014-05-07 HISTORY — DX: Calculus of kidney: N20.0

## 2014-05-07 HISTORY — DX: Unspecified asthma, uncomplicated: J45.909

## 2014-05-07 HISTORY — PX: INSERTION OF MESH: SHX5868

## 2014-05-07 HISTORY — PX: INCISIONAL HERNIA REPAIR: SHX193

## 2014-05-07 HISTORY — PX: HERNIA REPAIR: SHX51

## 2014-05-07 HISTORY — PX: ABDOMINAL EXPLORATION SURGERY: SHX538

## 2014-05-07 SURGERY — REPAIR, HERNIA, INCISIONAL
Anesthesia: General | Site: Abdomen

## 2014-05-07 MED ORDER — VECURONIUM BROMIDE 10 MG IV SOLR
INTRAVENOUS | Status: AC
  Filled 2014-05-07: qty 10

## 2014-05-07 MED ORDER — ROCURONIUM BROMIDE 100 MG/10ML IV SOLN
INTRAVENOUS | Status: DC | PRN
  Administered 2014-05-07: 50 mg via INTRAVENOUS

## 2014-05-07 MED ORDER — ONDANSETRON HCL 4 MG/2ML IJ SOLN
4.0000 mg | Freq: Four times a day (QID) | INTRAMUSCULAR | Status: DC | PRN
  Administered 2014-05-07: 4 mg via INTRAVENOUS
  Filled 2014-05-07: qty 2

## 2014-05-07 MED ORDER — ARTIFICIAL TEARS OP OINT
TOPICAL_OINTMENT | OPHTHALMIC | Status: AC
  Filled 2014-05-07: qty 3.5

## 2014-05-07 MED ORDER — 0.9 % SODIUM CHLORIDE (POUR BTL) OPTIME
TOPICAL | Status: DC | PRN
  Administered 2014-05-07: 1000 mL
  Administered 2014-05-07: 2000 mL
  Administered 2014-05-07 (×2): 1000 mL

## 2014-05-07 MED ORDER — ARTIFICIAL TEARS OP OINT
TOPICAL_OINTMENT | OPHTHALMIC | Status: DC | PRN
  Administered 2014-05-07: 1 via OPHTHALMIC

## 2014-05-07 MED ORDER — DIPHENHYDRAMINE HCL 50 MG/ML IJ SOLN
12.5000 mg | Freq: Four times a day (QID) | INTRAMUSCULAR | Status: DC | PRN
  Filled 2014-05-07: qty 0.25

## 2014-05-07 MED ORDER — GLYCOPYRROLATE 0.2 MG/ML IJ SOLN
INTRAMUSCULAR | Status: AC
  Filled 2014-05-07: qty 4

## 2014-05-07 MED ORDER — FENTANYL CITRATE 0.05 MG/ML IJ SOLN
INTRAMUSCULAR | Status: AC
  Filled 2014-05-07: qty 5

## 2014-05-07 MED ORDER — PROPOFOL 10 MG/ML IV BOLUS
INTRAVENOUS | Status: DC | PRN
  Administered 2014-05-07: 150 mg via INTRAVENOUS

## 2014-05-07 MED ORDER — DIPHENHYDRAMINE HCL 12.5 MG/5ML PO ELIX
12.5000 mg | ORAL_SOLUTION | Freq: Four times a day (QID) | ORAL | Status: DC | PRN
  Filled 2014-05-07: qty 5

## 2014-05-07 MED ORDER — GLYCOPYRROLATE 0.2 MG/ML IJ SOLN
INTRAMUSCULAR | Status: DC | PRN
  Administered 2014-05-07: .8 mg via INTRAVENOUS

## 2014-05-07 MED ORDER — SODIUM CHLORIDE 0.9 % IJ SOLN: 9.0000 mL | INTRAMUSCULAR | Status: DC | PRN

## 2014-05-07 MED ORDER — VANCOMYCIN HCL IN DEXTROSE 1-5 GM/200ML-% IV SOLN
1000.0000 mg | Freq: Two times a day (BID) | INTRAVENOUS | Status: AC
  Administered 2014-05-07 – 2014-05-08 (×2): 1000 mg via INTRAVENOUS
  Filled 2014-05-07 (×2): qty 200

## 2014-05-07 MED ORDER — PHENYLEPHRINE HCL 10 MG/ML IJ SOLN
10.0000 mg | INTRAVENOUS | Status: DC | PRN
  Administered 2014-05-07: 20 ug/min via INTRAVENOUS

## 2014-05-07 MED ORDER — FENTANYL 10 MCG/ML IV SOLN
INTRAVENOUS | Status: DC
  Administered 2014-05-07: 10 ug via INTRAVENOUS
  Administered 2014-05-07 – 2014-05-08 (×2): 180 ug via INTRAVENOUS
  Filled 2014-05-07 (×2): qty 50

## 2014-05-07 MED ORDER — LIDOCAINE HCL (CARDIAC) 20 MG/ML IV SOLN
INTRAVENOUS | Status: DC | PRN
  Administered 2014-05-07: 60 mg via INTRAVENOUS

## 2014-05-07 MED ORDER — LACTATED RINGERS IV SOLN
INTRAVENOUS | Status: DC
  Administered 2014-05-07 (×2): via INTRAVENOUS

## 2014-05-07 MED ORDER — LIDOCAINE HCL (CARDIAC) 20 MG/ML IV SOLN
INTRAVENOUS | Status: AC
  Filled 2014-05-07: qty 5

## 2014-05-07 MED ORDER — ENOXAPARIN SODIUM 40 MG/0.4ML ~~LOC~~ SOLN
40.0000 mg | SUBCUTANEOUS | Status: DC
Start: 2014-05-08 — End: 2014-05-08
  Filled 2014-05-07: qty 0.4

## 2014-05-07 MED ORDER — STERILE WATER FOR INJECTION IJ SOLN
INTRAMUSCULAR | Status: AC
  Filled 2014-05-07: qty 10

## 2014-05-07 MED ORDER — ONDANSETRON HCL 4 MG/2ML IJ SOLN
INTRAMUSCULAR | Status: AC
  Filled 2014-05-07: qty 2

## 2014-05-07 MED ORDER — VECURONIUM BROMIDE 10 MG IV SOLR
INTRAVENOUS | Status: DC | PRN
  Administered 2014-05-07: 3 mg via INTRAVENOUS
  Administered 2014-05-07: 2 mg via INTRAVENOUS
  Administered 2014-05-07: 3 mg via INTRAVENOUS
  Administered 2014-05-07 (×2): 2 mg via INTRAVENOUS

## 2014-05-07 MED ORDER — MIDAZOLAM HCL 5 MG/5ML IJ SOLN
INTRAMUSCULAR | Status: DC | PRN
  Administered 2014-05-07: 1 mg via INTRAVENOUS

## 2014-05-07 MED ORDER — ONDANSETRON HCL 4 MG/2ML IJ SOLN
INTRAMUSCULAR | Status: DC | PRN
  Administered 2014-05-07: 4 mg via INTRAVENOUS

## 2014-05-07 MED ORDER — EPHEDRINE SULFATE 50 MG/ML IJ SOLN
INTRAMUSCULAR | Status: AC
  Filled 2014-05-07: qty 1

## 2014-05-07 MED ORDER — FLUTICASONE PROPIONATE 50 MCG/ACT NA SUSP
1.0000 | Freq: Every day | NASAL | Status: DC
  Filled 2014-05-07: qty 16

## 2014-05-07 MED ORDER — VANCOMYCIN HCL 1000 MG IV SOLR
1000.0000 mg | INTRAVENOUS | Status: DC | PRN
  Administered 2014-05-07: 1500 mg via INTRAVENOUS

## 2014-05-07 MED ORDER — FENTANYL CITRATE 0.05 MG/ML IJ SOLN
INTRAMUSCULAR | Status: DC | PRN
  Administered 2014-05-07: 100 ug via INTRAVENOUS
  Administered 2014-05-07: 50 ug via INTRAVENOUS
  Administered 2014-05-07: 150 ug via INTRAVENOUS

## 2014-05-07 MED ORDER — FENTANYL CITRATE 0.05 MG/ML IJ SOLN
25.0000 ug | INTRAMUSCULAR | Status: DC | PRN
  Administered 2014-05-07 (×2): 50 ug via INTRAVENOUS

## 2014-05-07 MED ORDER — NALOXONE HCL 0.4 MG/ML IJ SOLN
0.4000 mg | INTRAMUSCULAR | Status: DC | PRN
  Filled 2014-05-07: qty 1

## 2014-05-07 MED ORDER — PROMETHAZINE HCL 25 MG/ML IJ SOLN
6.2500 mg | INTRAMUSCULAR | Status: DC | PRN
Start: 2014-05-07 — End: 2014-05-07

## 2014-05-07 MED ORDER — PROPOFOL 10 MG/ML IV BOLUS
INTRAVENOUS | Status: AC
  Filled 2014-05-07: qty 20

## 2014-05-07 MED ORDER — METOPROLOL TARTRATE 1 MG/ML IV SOLN
2.5000 mg | Freq: Four times a day (QID) | INTRAVENOUS | Status: DC
  Administered 2014-05-07 – 2014-05-08 (×3): 2.5 mg via INTRAVENOUS
  Filled 2014-05-07 (×7): qty 5

## 2014-05-07 MED ORDER — NEOSTIGMINE METHYLSULFATE 10 MG/10ML IV SOLN
INTRAVENOUS | Status: DC | PRN
Start: 2014-05-07 — End: 2014-05-07
  Administered 2014-05-07: 5 mg via INTRAVENOUS

## 2014-05-07 MED ORDER — POTASSIUM CHLORIDE IN NACL 20-0.9 MEQ/L-% IV SOLN
INTRAVENOUS | Status: DC
  Administered 2014-05-07 – 2014-05-12 (×7): via INTRAVENOUS
  Filled 2014-05-07 (×14): qty 1000

## 2014-05-07 MED ORDER — ONDANSETRON HCL 4 MG PO TABS: 4.0000 mg | ORAL_TABLET | Freq: Four times a day (QID) | ORAL | Status: DC | PRN

## 2014-05-07 MED ORDER — PANTOPRAZOLE SODIUM 40 MG IV SOLR
40.0000 mg | Freq: Every day | INTRAVENOUS | Status: DC
  Administered 2014-05-07 – 2014-05-09 (×3): 40 mg via INTRAVENOUS
  Filled 2014-05-07 (×6): qty 40

## 2014-05-07 MED ORDER — MIDAZOLAM HCL 2 MG/2ML IJ SOLN
INTRAMUSCULAR | Status: AC
  Filled 2014-05-07: qty 2

## 2014-05-07 MED ORDER — PHENYLEPHRINE HCL 10 MG/ML IJ SOLN
INTRAMUSCULAR | Status: DC | PRN
  Administered 2014-05-07: 120 ug via INTRAVENOUS
  Administered 2014-05-07: 80 ug via INTRAVENOUS
  Administered 2014-05-07: 40 ug via INTRAVENOUS

## 2014-05-07 MED ORDER — ONDANSETRON HCL 4 MG/2ML IJ SOLN
4.0000 mg | Freq: Four times a day (QID) | INTRAMUSCULAR | Status: DC | PRN
  Filled 2014-05-07: qty 2

## 2014-05-07 MED ORDER — SODIUM CHLORIDE 0.9 % IJ SOLN
INTRAMUSCULAR | Status: AC
  Filled 2014-05-07: qty 10

## 2014-05-07 MED ORDER — MEPERIDINE HCL 25 MG/ML IJ SOLN: 6.2500 mg | INTRAMUSCULAR | Status: DC | PRN

## 2014-05-07 MED ORDER — ROCURONIUM BROMIDE 50 MG/5ML IV SOLN
INTRAVENOUS | Status: AC
  Filled 2014-05-07: qty 1

## 2014-05-07 MED ORDER — FENTANYL CITRATE 0.05 MG/ML IJ SOLN
INTRAMUSCULAR | Status: AC
  Filled 2014-05-07: qty 2

## 2014-05-07 MED ORDER — NEOSTIGMINE METHYLSULFATE 10 MG/10ML IV SOLN
INTRAVENOUS | Status: AC
  Filled 2014-05-07: qty 1

## 2014-05-07 SURGICAL SUPPLY — 61 items
BAG DECANTER FOR FLEXI CONT (MISCELLANEOUS) IMPLANT
BINDER ABDOMINAL 12 ML 46-62 (SOFTGOODS) ×4 IMPLANT
BIOPATCH RED 1 DISK 7.0 (GAUZE/BANDAGES/DRESSINGS) ×2 IMPLANT
BIOPATCH RED 1IN DISK 7.0MM (GAUZE/BANDAGES/DRESSINGS) ×2
BLADE SURG ROTATE 9660 (MISCELLANEOUS) ×2 IMPLANT
CANISTER SUCTION 2500CC (MISCELLANEOUS) ×3 IMPLANT
CHLORAPREP W/TINT 26ML (MISCELLANEOUS) ×3 IMPLANT
COVER SURGICAL LIGHT HANDLE (MISCELLANEOUS) ×3 IMPLANT
DECANTER SPIKE VIAL GLASS SM (MISCELLANEOUS) IMPLANT
DRAIN CHANNEL 19F RND (DRAIN) ×4 IMPLANT
DRAPE LAPAROSCOPIC ABDOMINAL (DRAPES) ×3 IMPLANT
DRAPE UTILITY XL STRL (DRAPES) ×2 IMPLANT
ELECT CAUTERY BLADE 6.4 (BLADE) ×5 IMPLANT
ELECT REM PT RETURN 9FT ADLT (ELECTROSURGICAL) ×3
ELECTRODE REM PT RTRN 9FT ADLT (ELECTROSURGICAL) ×1 IMPLANT
EVACUATOR SILICONE 100CC (DRAIN) ×4 IMPLANT
GAUZE SPONGE 4X4 12PLY STRL (GAUZE/BANDAGES/DRESSINGS) ×4 IMPLANT
GLOVE BIO SURGEON STRL SZ 6.5 (GLOVE) ×2 IMPLANT
GLOVE BIO SURGEON STRL SZ7 (GLOVE) ×4 IMPLANT
GLOVE BIO SURGEON STRL SZ7.5 (GLOVE) ×2 IMPLANT
GLOVE BIO SURGEONS STRL SZ 6.5 (GLOVE) ×2
GLOVE BIOGEL PI IND STRL 6.5 (GLOVE) IMPLANT
GLOVE BIOGEL PI IND STRL 7.0 (GLOVE) IMPLANT
GLOVE BIOGEL PI IND STRL 8 (GLOVE) IMPLANT
GLOVE BIOGEL PI INDICATOR 6.5 (GLOVE) ×6
GLOVE BIOGEL PI INDICATOR 7.0 (GLOVE) ×2
GLOVE BIOGEL PI INDICATOR 8 (GLOVE) ×2
GLOVE EUDERMIC 7 POWDERFREE (GLOVE) ×5 IMPLANT
GOWN STRL REUS W/ TWL LRG LVL3 (GOWN DISPOSABLE) ×2 IMPLANT
GOWN STRL REUS W/ TWL XL LVL3 (GOWN DISPOSABLE) ×1 IMPLANT
GOWN STRL REUS W/TWL LRG LVL3 (GOWN DISPOSABLE) ×6
GOWN STRL REUS W/TWL XL LVL3 (GOWN DISPOSABLE) ×6
KIT BASIN OR (CUSTOM PROCEDURE TRAY) ×3 IMPLANT
KIT ROOM TURNOVER OR (KITS) ×3 IMPLANT
MARKER SKIN DUAL TIP RULER LAB (MISCELLANEOUS) ×2 IMPLANT
MESH VENTRALIGHT ST 10X13IN (Mesh General) ×2 IMPLANT
NDL HYPO 25GX1X1/2 BEV (NEEDLE) ×1 IMPLANT
NEEDLE HYPO 25GX1X1/2 BEV (NEEDLE) IMPLANT
NS IRRIG 1000ML POUR BTL (IV SOLUTION) ×7 IMPLANT
PACK GENERAL/GYN (CUSTOM PROCEDURE TRAY) ×3 IMPLANT
PAD ABD 8X10 STRL (GAUZE/BANDAGES/DRESSINGS) ×4 IMPLANT
PAD ARMBOARD 7.5X6 YLW CONV (MISCELLANEOUS) ×4 IMPLANT
PENCIL BUTTON BLDE SNGL 10FT (ELECTRODE) ×2 IMPLANT
RETAINER VISCERA MED (MISCELLANEOUS) ×2 IMPLANT
SPONGE LAP 18X18 X RAY DECT (DISPOSABLE) ×6 IMPLANT
STAPLER VISISTAT 35W (STAPLE) ×4 IMPLANT
SUT ETHILON 3 0 FSL (SUTURE) ×4 IMPLANT
SUT NOVA 1 T20/GS 25DT (SUTURE) ×22 IMPLANT
SUT NOVA NAB GS-21 1 T12 (SUTURE) IMPLANT
SUT PDS AB 1 CTX 36 (SUTURE) IMPLANT
SUT PROLENE 0 CT 1 CR/8 (SUTURE) IMPLANT
SUT VIC AB 2-0 CT1 27 (SUTURE)
SUT VIC AB 2-0 CT1 TAPERPNT 27 (SUTURE) IMPLANT
SUT VIC AB 3-0 CT1 27 (SUTURE)
SUT VIC AB 3-0 CT1 TAPERPNT 27 (SUTURE) IMPLANT
SYR CONTROL 10ML LL (SYRINGE) ×1 IMPLANT
TAPE CLOTH SURG 6X10 WHT LF (GAUZE/BANDAGES/DRESSINGS) ×2 IMPLANT
TOWEL OR 17X24 6PK STRL BLUE (TOWEL DISPOSABLE) ×1 IMPLANT
TOWEL OR 17X26 10 PK STRL BLUE (TOWEL DISPOSABLE) ×3 IMPLANT
TRAY FOLEY CATH 14FRSI W/METER (CATHETERS) ×2 IMPLANT
WATER STERILE IRR 1000ML POUR (IV SOLUTION) IMPLANT

## 2014-05-07 NOTE — Consult Note (Addendum)
CARDIOLOGY CONSULT NOTE       Patient ID: Jacob Marks MRN: [id number] DOB/AGE: 1960-12-15 54 y.o.  Admit date: 05/07/2014 Referring Physician:  Dalbert Batman Primary Physician: Loura Pardon, MD Primary Cardiologist:  Rockey Situ Reason for Consultation:  Post op management of Heart medication Patient NPO  Principal Problem:   Incarcerated incisional hernia   HPI:   Obese 54 yo s/p complex ventral hernia surgery by Dr Dalbert Batman.  Multiple previous surgeries with abscess, hematoma and previous ileostomy.  Surgery went well with no hemodynamic issues.  He is resting comfortably in PACU with no arrhythmias on telemetry  He will be NPO for days.  Review of notes from Dr Rockey Situ indicates [place] with DES to distal RCA April 2014.  Not clear why he is still on Brillinta  Also on meds for HTN and elevated lipids.  Post op orders written for iv lopressor PRN q6.  No chest pain or dyspnea currently. Last office visit 11/15 stable with no chest pain.  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  Has had fatigue with beta blockers in past.  EF normal by Echo with no history of CHF or arrhythmias   ROS All other systems reviewed and negative except as noted above  Past Medical History  Diagnosis Date  . Hypertension   . Hyperlipidemia   . Testosterone deficiency   . Sleep apnea   . ED (erectile dysfunction)   . Pes planus   . Heart murmur     as a child  . Asthma     as a child  . Diverticulosis 7/12    with hemicolectomy-- complications   . CVA (cerebral infarction) 7/12    after his hemicolectomy  . History of colon resection   . Stroke   . Coronary artery disease   . STEMI (ST elevation myocardial infarction)   . History of kidney cancer     lesion on the right kidney  . Chronic kidney disease     renal cyst right    Family History  Problem Relation Age of Onset  . Heart disease Mother 32    MI  . Hypertension Mother   . Alcohol abuse Mother   . Heart disease Father   .  Hypertension Father   . Cancer Sister     colon    History   Social History  . Marital Status: Married    Spouse Name: N/A    Number of Children: N/A  . Years of Education: N/A   Occupational History  . Not on file.   Social History Main Topics  . Smoking status: Former Smoker    Quit date: 04/20/1978  . Smokeless tobacco: Never Used  . Alcohol Use: 0.0 oz/week    0 Not specified per week     Comment: 2 drinks per week  . Drug Use: No  . Sexual Activity: Not on file   Other Topics Concern  . Not on file   Social History Narrative    Past Surgical History  Procedure Laterality Date  . Tonsillectomy and adenoidectomy      as a child  . Testicle surgery      as a child  . Hemicolectomy  7/12    for diverticulosis, complic by leaking anastamosis/ abcess/ addn surg and iliostomy   . Carotid dopplers  8/12    normal - after cva   . Cardiac catheterization    . Coronary angioplasty      s/p stent  placement to the distal RCA     . chlorhexidine  1 application Topical Once  . fentaNYL      . fentaNYL   Intravenous 6 times per day   . lactated ringers 10 mL/hr at 05/07/14 1139    Physical Exam: Blood pressure 155/93, pulse 88, temperature 98.4 F (36.9 C), temperature source Oral, resp. rate 16, SpO2 97 %.   Awake but sleepy from anesthesia  Obese white male  HEENT: normal Neck supple with no adenopathy JVP normal no bruits no thyromegaly Lungs clear with no wheezing and good diaphragmatic motion Heart:  S1/S2 no murmur, no rub, gallop or click PMI normal Abdomen post surgery with large binder on  no bruit.  No HSM or HJR Distal pulses intact with no bruits No edema Neuro non-focal Skin warm and dry Foley catheter    Labs:   Lab Results  Component Value Date   WBC 6.0 05/04/2014   HGB 15.4 05/04/2014   HCT 44.4 05/04/2014   MCV 88.6 05/04/2014   PLT 185 05/04/2014    Recent Labs Lab 05/04/14 1449  NA 142  K 4.1  CL 105  CO2 26  BUN 18    CREATININE 1.07  CALCIUM 9.6  PROT 6.6  BILITOT 0.7  ALKPHOS 93  ALT 30  AST 29  GLUCOSE 147*   No results found for: CKTOTAL, CKMB, CKMBINDEX, TROPONINI Lab Results  Component Value Date   CHOL 216* 01/15/2010   CHOL 190 01/30/2008   Lab Results  Component Value Date   HDL 42.30 01/15/2010   HDL 39.5 01/30/2008   Lab Results  Component Value Date   LDLCALC 123* 01/30/2008   Lab Results  Component Value Date   TRIG 441.0* 01/15/2010   TRIG 136 01/30/2008   Lab Results  Component Value Date   CHOLHDL 5 01/15/2010   CHOLHDL 4.8 CALC 01/30/2008   Lab Results  Component Value Date   LDLDIRECT 133.9 01/15/2010      Radiology: Chest 2 View  04/24/2014   CLINICAL DATA:  Preoperative exam prior to hernia repair; history of asthma, hypertension, and coronary angioplasty  EXAM: CHEST  2 VIEW  COMPARISON:  PA and lateral chest of June 09, 2011  FINDINGS: The lungs are adequately inflated. There is no focal infiltrate. The interstitial markings are mildly increased bilaterally. There is subsegmental atelectasis versus scarring in the posterior costophrenic gutter on the right. The heart and pulmonary vascularity are normal. There is no pleural effusion or pneumothorax. The bony thorax is unremarkable.  IMPRESSION: There are mildly increased interstitial markings diffusely which may be related to the patient's history of asthma. There is no evidence of CHF.   Electronically Signed   By: David  Martinique   On: 04/24/2014 08:09   Ct Abdomen Pelvis W Contrast  04/23/2014   CLINICAL DATA:  Pain at site of known ventral hernia, initial evaluation  EXAM: CT ABDOMEN AND PELVIS WITH CONTRAST  TECHNIQUE: Multidetector CT imaging of the abdomen and pelvis was performed using the standard protocol following bolus administration of intravenous contrast.  CONTRAST:  164mL OMNIPAQUE IOHEXOL 300 MG/ML  SOLN  COMPARISON:  None.  FINDINGS: Visualized portions of the lung bases are clear. Heart size is  normal.  Mild to moderate hepatic steatosis. Several low-attenuation lesions in the left lobe of the liver, some too small to characterize but all likely represent cysts. The largest is about 1.5 cm and measures Hounsfield attenuation value of 0. Spleen is normal except  for 17 mm low-attenuation lesion inferiorly. Gallbladder is normal. Pancreas is normal.  Mild fullness left adrenal gland is suggesting probable hyperplasia. Minimal fullness right adrenal gland. Upper pole right renal lesion measures about 15 mm with average attenuation value of 11, most consistent with a cyst. Lower pole 18 mm lesion, exophytic, with average attenuation value of 56. Two stones lower pole right kidney, the larger measuring 5 mm, with no obstruction. 4 mm stone lower pole left kidney with no hydronephrosis.  Bladder is normal. Reproductive organs are normal. Large circumscribed fat containing mass anterior to the left proximal thigh musculature, measuring 11 cm. This is not completely visualized but it is likely a lipoma.  Nonobstructive bowel gas pattern. Stomach is normal. Large bowel is normal. Appendix is not dilated with no evidence of inflammatory change around the appendix.  Anterior pelvic wall demonstrates a hernia to the right of midline. This contains a few loops of small bowel. The distal loop is decompressed while the other is mildly dilated at about 3.7 cm. There is mild hazy attenuation within the hernia sac, which measures about 8 cm. Distally small bowel is decompressed. Small bowel is regionally distended where involved with the hernia, but is decompressed both proximally and distally.  IMPRESSION: 1. Ventral hernia anterior right pelvic wall. Evidence of mild inflammation with focal bowel loop dilatation involving small bowel. Findings are concerning for possible strangulation and focal obstruction. Critical Value/emergent results were called by telephone at the time of interpretation on 04/23/2014 at 10:13 pm to  [facility] , who verbally acknowledged these results. 2. 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 Upper pole right renal cyst. Lower pole indeterminate renal lesion. Consider renal ultrasound or MRI to further evaluate and exclude a solid mass. 4. Nonobstructing renal calculi 5. Nonspecific splenic mass statistically likely benign possibly a hemangioma. 6. Numerous liver lesions some too small to characterize, probably cysts. 7. Bilateral adrenal hyperplasia   Electronically Signed   By: Skipper Cliche M.D.   On: 04/23/2014 22:15    EKG:  04/24/14  SB rate 56 normal ECG   ASSESSMENT AND PLAN:  CAD:  Ok to hold brillinta indefinately as DES was over 1.5 years ago and he has had complex surgery and previous abdominal hematoma  Resume 81 mg of ASA when possible  Post op ECG for baseline HTN:  Stable now with systolic BP 676 mmHg  Can use IV lopressor or hydralazine for any signficant rise over 160 mmHg Chol:  Hold statin until taking PO Hernia:  Routine post op care NG tube in place DVT prophylaxis    Signed: Jenkins Rouge 05/07/2014, 5:37 PM

## 2014-05-07 NOTE — Anesthesia Procedure Notes (Signed)
Procedure Name: Intubation Date/Time: 05/07/2014 1:11 PM Performed by: Sampson Si E Pre-anesthesia Checklist: Patient identified, Emergency Drugs available, Suction available, Patient being monitored and Timeout performed Patient Re-evaluated:Patient Re-evaluated prior to inductionOxygen Delivery Method: Circle system utilized Preoxygenation: Pre-oxygenation with 100% oxygen Intubation Type: IV induction Ventilation: Oral airway inserted - appropriate to patient size and Mask ventilation without difficulty Laryngoscope Size: Mac and 3 Grade View: Grade II Tube type: Oral Tube size: 7.5 mm Number of attempts: 1 Airway Equipment and Method: Stylet Placement Confirmation: ETT inserted through vocal cords under direct vision,  positive ETCO2 and breath sounds checked- equal and bilateral Secured at: 23 cm Tube secured with: Tape Dental Injury: Teeth and Oropharynx as per pre-operative assessment

## 2014-05-07 NOTE — Op Note (Signed)
Patient Name:           Jacob Marks   Date of Surgery:        05/07/2014  Pre op Diagnosis:      Multiple incarcerated ventral incisional hernias   Post op Diagnosis:    Multiple incarcerated ventral incisional hernias                                      Incarcerated incisional hernia, right lower quadrant (prior loop ileostomy site)  Procedure:                 Exploratory laparotomy                                      60 minute lysis of adhesions                                      Repair multiple, incarcerated incisional hernias with mesh                                      Left, unilateral myofascial muscle advancement flap (components separation technique) (01093)   Surgeon:                     Edsel Petrin. Dalbert Batman, M.D., FACS  Assistant:                      Ralene Ok, M.D., [facility]  Operative Indications:   The patient is a 54 year old male who presents with an incisional hernia. This gentleman returns with his wife to discuss surgical options for repair of his recurrent incisional hernia. Please see my office note dated March 13, 2014. I have reviewed his records from [place]. On October 20, 2010 he underwent left hemicolectomy with anastomosis and takedown of splenic flexure. On October 29, 2010 he underwent reexploration because of large intra-abdominal hematoma and placement of negative pressure dressing On October 31, 2010 he underwent reexploration, lysis of adhesions and ileostomy On November 25, 2010 he underwent incision and drainage of abdominal wall abscess On March 2013 he underwent closure of loop  ileostomy by Dr. Morton Stall at [facility]. Fortunately he was able to do this through the right lower quadrant incision and pull up the old distal limb.CT scan was performed on February 26, 2014 which shows the ventral hernia slightly to the right of the midline in the periumbilical area with small bowel in it. Multiple small bowel anastomoses were noted. There were several  other smaller hernias in the midline. Diverticulosis of the descending colon was noted. Bilateral lower pole nonobstructing kidney stones were noted. He has been evaluated by Dr. Ida Rogue at [facility] cardiology. He was seen on February 21, 2014. He was noted to be asymptomatic. Ejection fraction recently was 55% with normal right ventricular systolic pressures and no significant wall motion abnormalities. Note was made of his STEMI on August 05, 2012 with occluded distal RCA. Drug-eluting stent was placed and thrombectomy performed. No other significant disease was noted. We were advised that we could discontinue his Brelinta two days preop but  to continue  the aspirin. He has been hospitalized in [place] and also at [facility] briefly for partial obstructions thought to be related to his incisional hernia. I have advised him to have this repaired and he is completely  in favor of this.We have had lengthy conversations about the risks and benefit of this procedure and he is ready to proceed.  Operative Findings:       The patient had an incarcerated incisional hernia the size of a grapefruit to the right of the umbilicus. At multiple other hernias from the subxiphoid area to the lower end of the incision. There is also an incarcerated hernia in the right flank where the loop ileostomy used to be. He had a complex midline scar which was excised. He had extensive chronic adhesions in the abdomen which took over 60 minutes to take down. To repair the hernia we did an inlay mesh repair with ventraLite- ST mesh. This was at least 30 cm vertically by about 23 cm transversely. We were able to close the fascia in the midline over the mesh. We had to do a left unilateral component separation technique. Before I closed the abdomen I could palpate the NG tube in the antrum of the stomach.  Procedure in Detail:          Following the induction of general endotracheal anesthesia a Foley catheter and a nasogastric  tube was placed. The abdomen was prepped and draped from the nipples to the genitalia. Intravenous antibiotics were given. Surgical timeout was performed.    A long midline incision was performed, excising the entire scar and the umbilicus. I entered the abdominal cavity in the upper midline and slowly begin taking adhesions down and opening the fascia. This took a long time but ultimately I was able to completely open up the fascia from the subxiphoid area to the lower abdomen. I identified multiple hernia defects which were opened up and debrided. There were a lot of loops of small bowel caught in the right lower quadrant hernia but we were able to take all these adhesions down and returned the small bowel to the abdominal cavity. We resected the hernia sac in the right lower quadrant hernia.  I took adhesions down extensively under the fascia back 10 or 12 cm in all directions. I found a second hernia in the right lateral abdomen  where his loop ileostomy was. I closed that internally with multiple interrupted sutures of #1 Novafil. These were placed transversely. I debrided the thinned out edges of fascia. I then undermined the subcutaneous tissue in all directions so I could get back about 8 or 9 cm for fixation of the mesh.    Once I was certain that I had strong fascia circumferentially and that I had undermined the subcutaneous tissue adequately and had taken the bowel adhesions down adequately I chose to perform the repair with inlay composite ventral right ST mesh. I brought the mesh to the operative field. I trimmed it laterally just a little bit perhaps 1 cm on each lateral side but use the entire vertical length. This allowed me to cover all of the defects including the right lateral abdomen defect where the loop ileostomy was. The mesh was sutured in place as an inlay mesh with numerous interrupted mattress sutures of #1 Novafil. These were placed sequentially and circumferentially. I was able to get  the mesh back 7-9 cm under the fascia in all directions. After all the sutures were placed and tied the repair was examined  and there did not seem  to be any defect or holes. The wound was irrigated. The fascia closure was a little bit tight so on the left side I went out lateral to the left rectus muscle and performed a component separation technique by incising the external oblique fascia from the rib cage all the Pandolfi down to the pelvis. This allowed mobilization of the left abdominal wall and allowed closure of the fascia in the midline over the mesh. The fascia was closed in the midline with interrupted sutures of #1 Novafil. Two 19 French Blake drains were placed in the wound and brought out through separate stab incisions one in the right upper quadrant and one in the left upper quadrant, sutured the skin and connected to suction bulbs. The skin was closed with skin staples. Clean bandages and a Velcro binder was placed. The patient tolerated the procedure well was taken to PACU in stable condition. EBL 150 mL. Counts correct. Complications none.     Edsel Petrin. Dalbert Batman, M.D., FACS General and Minimally Invasive Surgery Breast and Colorectal Surgery  05/07/2014 4:03 PM

## 2014-05-07 NOTE — Interval H&P Note (Signed)
History and Physical Interval Note:  05/07/2014 12:28 PM  Jacob Marks  has presented today for surgery, with the diagnosis of recurrent incisional hernia  The goals and the various methods of treatment have been discussed with the patient and family. After consideration of risks, benefits and other options for treatment, the patient has consented to  Procedure(s): REPAIR RECURRENT INCISIONAL HERNIA (N/A) INSERTION OF MESH (N/A) as a surgical intervention .  The patient's history has been reviewed, patient examined today, no change in status, stable for surgery.  I have reviewed the patient's chart and labs.  Questions were answered to the patient's satisfaction.     Adin Hector

## 2014-05-07 NOTE — Anesthesia Preprocedure Evaluation (Addendum)
Anesthesia Evaluation  Patient identified by MRN, date of birth, ID band Patient awake    Reviewed: Allergy & Precautions, NPO status , Patient's Chart, lab work & pertinent test results, reviewed documented beta blocker date and time   Airway Mallampati: III  TM Distance: >3 FB Neck ROM: Full    Dental  (+) Teeth Intact, Dental Advisory Given   Pulmonary shortness of breath, asthma , sleep apnea , former smoker,          Cardiovascular hypertension, Pt. on home beta blockers and Pt. on medications + CAD and + Past MI + Valvular Problems/Murmurs Rhythm:Regular     Neuro/Psych PSYCHIATRIC DISORDERS Anxiety Depression CVA    GI/Hepatic Neg liver ROS, GERD-  ,  Endo/Other  Morbid obesity  Renal/GU Renal disease     Musculoskeletal negative musculoskeletal ROS (+)   Abdominal   Peds  Hematology  (+) anemia ,   Anesthesia Other Findings   Reproductive/Obstetrics                          Anesthesia Physical Anesthesia Plan  ASA: III  Anesthesia Plan: General   Post-op Pain Management:    Induction: Intravenous  Airway Management Planned: Oral ETT  Additional Equipment: None  Intra-op Plan:   Post-operative Plan: Extubation in OR  Informed Consent: I have reviewed the patients History and Physical, chart, labs and discussed the procedure including the risks, benefits and alternatives for the proposed anesthesia with the patient or authorized representative who has indicated his/her understanding and acceptance.   Dental advisory given  Plan Discussed with: CRNA  Anesthesia Plan Comments:         Anesthesia Quick Evaluation

## 2014-05-07 NOTE — Transfer of Care (Signed)
Immediate Anesthesia Transfer of Care Note  Patient: Jacob Marks  Procedure(s) Performed: Procedure(s): REPAIR RECURRENT INCISIONAL HERNIA (N/A) INSERTION OF MESH (N/A)  Patient Location: PACU  Anesthesia Type:General  Level of Consciousness: lethargic and responds to stimulation  Airway & Oxygen Therapy: Patient Spontanous Breathing and Patient connected to face mask oxygen  Post-op Assessment: Report given to PACU RN  Post vital signs: Reviewed and stable  Complications: No apparent anesthesia complications

## 2014-05-07 NOTE — Progress Notes (Signed)
Pharmacy Note - Vancomycin  Asked to give vancomycin for 24 hours post op - s/p ex lap, LOA, repair of multiple hernias with mesh.  He received 1.5g at 1300 today.  Wt is 120kg, scr 1.07.  Plan: vancomycin 1g IV q12h x 2 doses starting at 10pm today. Pharmacy will sign off.  Please re-consult if needed.  Heide Guile, PharmD, BCPS Clinical Pharmacist Pager [phone]

## 2014-05-07 NOTE — Anesthesia Postprocedure Evaluation (Signed)
  Anesthesia Post-op Note  Patient: Jacob Marks  Procedure(s) Performed: Procedure(s): REPAIR RECURRENT INCISIONAL HERNIA (N/A) INSERTION OF MESH (N/A)  Patient Location: PACU  Anesthesia Type:General  Level of Consciousness: awake and alert   Airway and Oxygen Therapy: Patient Spontanous Breathing and Patient connected to nasal cannula oxygen  Post-op Pain: mild  Post-op Assessment: Post-op Vital signs reviewed, Patient's Cardiovascular Status Stable, Respiratory Function Stable and Patent Airway  Post-op Vital Signs: Reviewed and stable  Last Vitals:  Filed Vitals:   05/07/14 1617  BP: 163/75  Pulse: 88  Temp: 36.9 C  Resp: 18    Complications: No apparent anesthesia complications

## 2014-05-08 LAB — BASIC METABOLIC PANEL
Anion gap: 14 (ref 5–15)
BUN: 14 mg/dL (ref 6–23)
CO2: 16 mmol/L — AB (ref 19–32)
CREATININE: 1.42 mg/dL — AB (ref 0.50–1.35)
Calcium: 8.6 mg/dL (ref 8.4–10.5)
Chloride: 108 mEq/L (ref 96–112)
GFR calc non Af Amer: 55 mL/min — ABNORMAL LOW (ref >90)
GFR, EST AFRICAN AMERICAN: 64 mL/min — AB (ref >90)
Glucose, Bld: 197 mg/dL — ABNORMAL HIGH (ref 70–99)
POTASSIUM: 4.7 mmol/L (ref 3.5–5.1)
Sodium: 138 mmol/L (ref 135–145)

## 2014-05-08 LAB — CBC
HCT: 46 % (ref 39.0–52.0)
Hemoglobin: 15.5 g/dL (ref 13.0–17.0)
MCH: 29.7 pg (ref 26.0–34.0)
MCHC: 33.7 g/dL (ref 30.0–36.0)
MCV: 88.1 fL (ref 78.0–100.0)
Platelets: 238 10*3/uL (ref 150–400)
RBC: 5.22 MIL/uL (ref 4.22–5.81)
RDW: 13.6 % (ref 11.5–15.5)
WBC: 16.3 10*3/uL — ABNORMAL HIGH (ref 4.0–10.5)

## 2014-05-08 LAB — GLUCOSE, CAPILLARY
GLUCOSE-CAPILLARY: 167 mg/dL — AB (ref 70–99)
GLUCOSE-CAPILLARY: 131 mg/dL — AB (ref 70–99)
Glucose-Capillary: 179 mg/dL — ABNORMAL HIGH (ref 70–99)
Glucose-Capillary: 122 mg/dL — ABNORMAL HIGH (ref 70–99)

## 2014-05-08 LAB — HEMOGLOBIN A1C
HEMOGLOBIN A1C: 5.5 % (ref <5.7)
Mean Plasma Glucose: 111 mg/dL (ref <117)

## 2014-05-08 MED ORDER — DIPHENHYDRAMINE HCL 12.5 MG/5ML PO ELIX: 12.5000 mg | ORAL_SOLUTION | Freq: Four times a day (QID) | ORAL | Status: DC | PRN

## 2014-05-08 MED ORDER — MORPHINE SULFATE (PF) 1 MG/ML IV SOLN
INTRAVENOUS | Status: DC
  Administered 2014-05-08: 06:00:00 via INTRAVENOUS
  Administered 2014-05-08: 10.5 mg via INTRAVENOUS
  Administered 2014-05-08: 01:00:00 via INTRAVENOUS
  Administered 2014-05-08: 20.08 mg via INTRAVENOUS
  Administered 2014-05-08: 12 mg via INTRAVENOUS
  Administered 2014-05-08: 15:00:00 via INTRAVENOUS
  Administered 2014-05-09 (×2): 3 mg via INTRAVENOUS
  Administered 2014-05-09: 1.5 mg via INTRAVENOUS
  Administered 2014-05-09: 12 mg via INTRAVENOUS
  Administered 2014-05-09: 4.5 mg via INTRAVENOUS
  Administered 2014-05-10: 9 mg via INTRAVENOUS
  Administered 2014-05-10: 07:00:00 via INTRAVENOUS
  Administered 2014-05-10: 7.5 mg via INTRAVENOUS
  Filled 2014-05-08 (×5): qty 25

## 2014-05-08 MED ORDER — INSULIN ASPART 100 UNIT/ML ~~LOC~~ SOLN
0.0000 [IU] | SUBCUTANEOUS | Status: DC
  Administered 2014-05-08: 3 [IU] via SUBCUTANEOUS
  Administered 2014-05-08: 4 [IU] via SUBCUTANEOUS
  Administered 2014-05-08: 3 [IU] via SUBCUTANEOUS
  Administered 2014-05-08: 4 [IU] via SUBCUTANEOUS
  Administered 2014-05-09 – 2014-05-10 (×4): 3 [IU] via SUBCUTANEOUS

## 2014-05-08 MED ORDER — SODIUM CHLORIDE 0.9 % IV BOLUS (SEPSIS)
500.0000 mL | Freq: Once | INTRAVENOUS | Status: AC
  Administered 2014-05-08: 500 mL via INTRAVENOUS

## 2014-05-08 MED ORDER — METOPROLOL TARTRATE 1 MG/ML IV SOLN
5.0000 mg | Freq: Four times a day (QID) | INTRAVENOUS | Status: DC
  Administered 2014-05-08 – 2014-05-10 (×8): 5 mg via INTRAVENOUS
  Filled 2014-05-08 (×12): qty 5

## 2014-05-08 MED ORDER — ENOXAPARIN SODIUM 60 MG/0.6ML ~~LOC~~ SOLN
60.0000 mg | SUBCUTANEOUS | Status: DC
  Administered 2014-05-08 – 2014-05-12 (×5): 60 mg via SUBCUTANEOUS
  Filled 2014-05-08 (×5): qty 0.6

## 2014-05-08 MED ORDER — NALOXONE HCL 0.4 MG/ML IJ SOLN: 0.4000 mg | INTRAMUSCULAR | Status: DC | PRN

## 2014-05-08 MED ORDER — DIPHENHYDRAMINE HCL 50 MG/ML IJ SOLN
12.5000 mg | Freq: Four times a day (QID) | INTRAMUSCULAR | Status: DC | PRN
  Administered 2014-05-08 – 2014-05-10 (×2): 12.5 mg via INTRAVENOUS
  Filled 2014-05-08 (×2): qty 1

## 2014-05-08 MED ORDER — SODIUM CHLORIDE 0.9 % IJ SOLN: 9.0000 mL | INTRAMUSCULAR | Status: DC | PRN

## 2014-05-08 MED ORDER — ACETAMINOPHEN 10 MG/ML IV SOLN
1000.0000 mg | Freq: Four times a day (QID) | INTRAVENOUS | Status: AC
  Administered 2014-05-08 – 2014-05-09 (×4): 1000 mg via INTRAVENOUS
  Filled 2014-05-08 (×4): qty 100

## 2014-05-08 MED ORDER — ONDANSETRON HCL 4 MG/2ML IJ SOLN: 4.0000 mg | Freq: Four times a day (QID) | INTRAMUSCULAR | Status: DC | PRN

## 2014-05-08 NOTE — Progress Notes (Signed)
Expand All Collapse All     Patient Name: WING SCHOCH Date of Encounter: 05/08/2014  Principal Problem:  Incarcerated incisional hernia Active Problems:  HYPERTENSION, CONTROLLED  CAD (coronary artery disease)  Testosterone deficiency  Hyperlipidemia    SUBJECTIVE  No CP, SOB, or pain. He hasn't slept all night and feels warm and diaphoretic. Been in Sinus tachycardia on/off throughout the night, rates as high as 120 this AM. He states he feels like his heart has been racing all night on and off. Has been having abd discomfort and is on morphine pca.   CURRENT MEDS . acetaminophen 1,000 mg Intravenous 4 times per day  . enoxaparin (LOVENOX) injection 60 mg Subcutaneous Q24H  . fluticasone 1 spray Each Nare Daily  . insulin aspart 0-20 Units Subcutaneous 6 times per day  . metoprolol 5 mg Intravenous 4 times per day  . morphine  Intravenous 6 times per day  . pantoprazole (PROTONIX) IV 40 mg Intravenous QHS  . vancomycin 1,000 mg Intravenous Q12H    OBJECTIVE  Filed Vitals:   05/07/14 2316 05/08/14 0102 05/08/14 0347 05/08/14 0520  BP:  117/64  113/66  Pulse: 108 115  125  Temp:  97.7 F (36.5 C)  97.8 F (36.6 C)  TempSrc:  Axillary  Oral  Resp:  18 18 17   Height:      Weight:      SpO2:  93% 94% 95%    Intake/Output Summary (Last 24 hours) at 05/08/14 0852 Last data filed at 05/08/14 2956  Gross per 24 hour  Intake 2469.58 ml  Output  1832 ml  Net 637.58 ml   Filed Weights   05/07/14 1813  Weight: 266 lb (120.657 kg)    PHYSICAL EXAM  General: Diaphoretic and clammy to touch. Pleasant, NAD. Neuro: Alert and oriented X 3. Moves all extremities spontaneously. Psych: Normal affect. HEENT: Normal Neck: Supple without bruits. Difficult to assess jvp 2/2 girth. Lungs: Resp regular and unlabored, CTA - limited exam 2/2 abd  binder. Heart: Tachycardia, RRR no s3, s4, 2/6 SEM bilat usb. Abdomen: Abd binder in place. Extremities: No clubbing, cyanosis or edema. DP/PT/Radials 2+ and equal bilaterally.  Accessory Clinical Findings  CBC  Recent Labs (last 2 labs)      Recent Labs  05/08/14 0408  WBC 16.3*  HGB 15.5  HCT 46.0  MCV 88.1  PLT 238     Basic Metabolic Panel  Recent Labs (last 2 labs)      Recent Labs  05/08/14 0408  NA 138  K 4.7  CL 108  CO2 16*  GLUCOSE 197*  BUN 14  CREATININE 1.42*  CALCIUM 8.6     TELE  Sinus tachycardia, 104, no acute st/t changes.  Radiology/Studies  None  ASSESSMENT AND PLAN  1. Incarcerated Incisional Hernia:  S/p repair yesterday.  Surgery following.  Afebrile but tachycardic with elev wbc.  Follow.  Suspect tachycardia 2/2 pain and possibly dehydration (creat 1.42 this am) -receiving morphine pca and ivf.  2.  CAD: no chest pain or sob. Tachycardic but no acute st/t changes.  Cont bb.  Resume asa/statin once taking PO's and ok with surgery.  S/p stenting in 2014.  Had been on brilinta prior to admission.  Now on hold.  3. Hypertension: Normotensive at 113/66. Currently on Metoprolol 5 mg IV.  Was on bb/acei @ home.  4. Hyperlipidemia: Home medication atorvastatin 80 mg on hold as he's not currently taking PO's.  Signed, Murray Hodgkins NP  05/08/2014, 11:47 AM    History and all data above reviewed.  Patient examined.  I agree with the findings as above.  The patient exam reveals COR:RRR  ,  Lungs: Decreased breath sounds  ,  Abd: Absent bowel sounds, Ext No edema  .  All available labs, radiology testing, previous records reviewed. Agree with documented assessment and plan. CAD:  Hold ASA until OK with surgery.  Brilinta will not need to be restarted.    Jeneen Rinks Cyndi Montejano  1:38 PM  05/08/2014

## 2014-05-08 NOTE — Progress Notes (Addendum)
1 Day Post-Op  Subjective: Stable and alert.  sinus tachycardia at 125. Excellent urine output. Normotensive. Denies chest pain. SPO2 95% on nasal cannula. Was a little short of breath but when I removed the abdominal binders he felt fine. Pain control better with morphine PCA. Fentanyl did not work so well.  Moderate volume drainage from abdominal drains. Hemoglobin 15.5. Potassium 4.7. BUN 14. Creatinine 1.42.(preop creatinine1.07). Glucose 197.  I appreciate cardiology advice by Dr. Johnsie Cancel  Objective: Vital signs in last 24 hours: Temp:  [97.1 F (36.2 C)-98.4 F (36.9 C)] 97.8 F (36.6 C) (01/19 0520) Pulse Rate:  [61-125] 125 (01/19 0520) Resp:  [14-20] 17 (01/19 0520) BP: (113-163)/(64-93) 113/66 mmHg (01/19 0520) SpO2:  [93 %-100 %] 95 % (01/19 0520) Weight:  [266 lb (120.657 kg)] 266 lb (120.657 kg) (01/18 1813) Last BM Date: 05/06/14  Intake/Output from previous day: 01/18 0701 - 01/19 0700 In: 2469.6 [I.V.:2239.6; NG/GT:30; IV Piggyback:200] Out: 0174 [Urine:1200; Emesis/NG output:200; Drains:282; Blood:150] Intake/Output this shift: Total I/O In: 1269.6 [I.V.:1039.6; NG/GT:30; IV Piggyback:200] Out: 1290 [Urine:850; Emesis/NG output:200; Drains:240]   EXAM: General appearance: Alert. Mild distress from pain. Oriented and cooperative. Skin warm and dry. Wife in room. Resp: clear to auscultation bilaterally GI: Soft. Appropriately sore. Bandages clean and dry. No signs of bleeding. JP drainage 280 cc overnight.  Lab Results:  Results for orders placed or performed during the hospital encounter of 05/07/14 (from the past 24 hour(s))  Basic metabolic panel     Status: Abnormal   Collection Time: 05/08/14  4:08 AM  Result Value Ref Range   Sodium 138 135 - 145 mmol/L   Potassium 4.7 3.5 - 5.1 mmol/L   Chloride 108 96 - 112 mEq/L   CO2 16 (L) 19 - 32 mmol/L   Glucose, Bld 197 (H) 70 - 99 mg/dL   BUN 14 6 - 23 mg/dL   Creatinine, Ser 1.42 (H) 0.50 - 1.35 mg/dL    Calcium 8.6 8.4 - 10.5 mg/dL   GFR calc non Af Amer 55 (L) >90 mL/min   GFR calc Af Amer 64 (L) >90 mL/min   Anion gap 14 5 - 15  CBC     Status: Abnormal   Collection Time: 05/08/14  4:08 AM  Result Value Ref Range   WBC 16.3 (H) 4.0 - 10.5 K/uL   RBC 5.22 4.22 - 5.81 MIL/uL   Hemoglobin 15.5 13.0 - 17.0 g/dL   HCT 46.0 39.0 - 52.0 %   MCV 88.1 78.0 - 100.0 fL   MCH 29.7 26.0 - 34.0 pg   MCHC 33.7 30.0 - 36.0 g/dL   RDW 13.6 11.5 - 15.5 %   Platelets 238 150 - 400 K/uL     Studies/Results: No results found.  Marland Kitchen acetaminophen  1,000 mg Intravenous 4 times per day  . enoxaparin (LOVENOX) injection  60 mg Subcutaneous Q24H  . fluticasone  1 spray Each Nare Daily  . metoprolol  5 mg Intravenous 4 times per day  . morphine   Intravenous 6 times per day  . pantoprazole (PROTONIX) IV  40 mg Intravenous QHS  . vancomycin  1,000 mg Intravenous Q12H     Assessment/Plan: s/p Procedure(s): REPAIR RECURRENT INCISIONAL HERNIA INSERTION OF MESH  POD #1. Laparotomy, extensive lysis of adhesions, repair complex incarcerated ventral hernia with mesh, component separation technique on left. No apparent wound or surgical problems Pain is not out of proportion to magnitude of surgery, but would like better pain control. Continue NG  and Foley. Up to chair today  Sinus tachycardia. Most likely this is due to pain. Less likely hypovolemia. We'll give IV Tylenol as supplement for 24 hours We'll give fluid bolus  check EKG for baseline. Recheck creatinine tomorrow  Glucose intolerance. The goal blood sugars would be 120-180 We'll order sliding scale insulin and check A1c.  Coronary artery disease with history STEMI with DES to distal RCA April 2014 Will increase beta blocker due to tachycardia Check EKG Hold Brelinta indefinitely but resume aspirin when possible  VTE prophylaxis. To start Lovenox per pharmacy calculation today.  BMI 39  History CVA Sleep  apnea HTN    @PROBHOSP @  LOS: 1 day    Chealsea Paske M 05/08/2014  . .prob

## 2014-05-08 NOTE — Progress Notes (Signed)
Wasted 8 ml of Fentanyl(52mcg/ml) PCA injection to sink, witnessed by Esaw Dace

## 2014-05-09 ENCOUNTER — Encounter (HOSPITAL_COMMUNITY): Payer: Self-pay | Admitting: General Surgery

## 2014-05-09 LAB — GLUCOSE, CAPILLARY
GLUCOSE-CAPILLARY: 108 mg/dL — AB (ref 70–99)
GLUCOSE-CAPILLARY: 122 mg/dL — AB (ref 70–99)
GLUCOSE-CAPILLARY: 125 mg/dL — AB (ref 70–99)
Glucose-Capillary: 111 mg/dL — ABNORMAL HIGH (ref 70–99)
Glucose-Capillary: 113 mg/dL — ABNORMAL HIGH (ref 70–99)
Glucose-Capillary: 114 mg/dL — ABNORMAL HIGH (ref 70–99)
Glucose-Capillary: 124 mg/dL — ABNORMAL HIGH (ref 70–99)

## 2014-05-09 LAB — CBC
HCT: 39.9 % (ref 39.0–52.0)
Hemoglobin: 13.4 g/dL (ref 13.0–17.0)
MCH: 29.8 pg (ref 26.0–34.0)
MCHC: 33.6 g/dL (ref 30.0–36.0)
MCV: 88.9 fL (ref 78.0–100.0)
PLATELETS: 179 10*3/uL (ref 150–400)
RBC: 4.49 MIL/uL (ref 4.22–5.81)
RDW: 14.2 % (ref 11.5–15.5)
WBC: 15.2 10*3/uL — AB (ref 4.0–10.5)

## 2014-05-09 LAB — BASIC METABOLIC PANEL
Anion gap: 3 — ABNORMAL LOW (ref 5–15)
BUN: 18 mg/dL (ref 6–23)
CO2: 28 mmol/L (ref 19–32)
CREATININE: 1.19 mg/dL (ref 0.50–1.35)
Calcium: 8.8 mg/dL (ref 8.4–10.5)
Chloride: 108 mEq/L (ref 96–112)
GFR calc non Af Amer: 68 mL/min — ABNORMAL LOW (ref >90)
GFR, EST AFRICAN AMERICAN: 79 mL/min — AB (ref >90)
Glucose, Bld: 134 mg/dL — ABNORMAL HIGH (ref 70–99)
Potassium: 4.5 mmol/L (ref 3.5–5.1)
Sodium: 139 mmol/L (ref 135–145)

## 2014-05-09 MED ORDER — MENTHOL 3 MG MT LOZG
1.0000 | LOZENGE | OROMUCOSAL | Status: DC | PRN
  Filled 2014-05-09: qty 9

## 2014-05-09 MED ORDER — BISACODYL 10 MG RE SUPP
10.0000 mg | Freq: Two times a day (BID) | RECTAL | Status: AC
  Administered 2014-05-09 (×2): 10 mg via RECTAL
  Filled 2014-05-09 (×2): qty 1

## 2014-05-09 MED ORDER — ASPIRIN EC 81 MG PO TBEC
81.0000 mg | DELAYED_RELEASE_TABLET | Freq: Every day | ORAL | Status: DC
  Administered 2014-05-09: 81 mg via ORAL
  Filled 2014-05-09 (×2): qty 1

## 2014-05-09 NOTE — Progress Notes (Signed)
SUBJECTIVE:  No chest pain.  No SOB   PHYSICAL EXAM Filed Vitals:   05/09/14 0622 05/09/14 0809 05/09/14 0944 05/09/14 1143  BP: 147/72  125/63   Pulse: 108  88   Temp: 98.2 F (36.8 C)  98.5 F (36.9 C)   TempSrc:   Oral   Resp: 19 18 19 19   Height:      Weight:      SpO2: 95% 95% 93% 93%   General:  No distress Lungs:  Clear Heart:  RRR Abdomen:  Tender Extremities:  Trace edema  LABS: No results found for: TROPONINI Results for orders placed or performed during the hospital encounter of 05/07/14 (from the past 24 hour(s))  Glucose, capillary     Status: Abnormal   Collection Time: 05/08/14  4:36 PM  Result Value Ref Range   Glucose-Capillary 131 (H) 70 - 99 mg/dL  Glucose, capillary     Status: Abnormal   Collection Time: 05/08/14  8:11 PM  Result Value Ref Range   Glucose-Capillary 122 (H) 70 - 99 mg/dL  Glucose, capillary     Status: Abnormal   Collection Time: 05/09/14 12:09 AM  Result Value Ref Range   Glucose-Capillary 125 (H) 70 - 99 mg/dL   Comment 1 Notify RN    Comment 2 Documented in Chart   Glucose, capillary     Status: Abnormal   Collection Time: 05/09/14  4:08 AM  Result Value Ref Range   Glucose-Capillary 122 (H) 70 - 99 mg/dL   Comment 1 Notify RN    Comment 2 Documented in Chart   CBC     Status: Abnormal   Collection Time: 05/09/14  4:23 AM  Result Value Ref Range   WBC 15.2 (H) 4.0 - 10.5 K/uL   RBC 4.49 4.22 - 5.81 MIL/uL   Hemoglobin 13.4 13.0 - 17.0 g/dL   HCT 39.9 39.0 - 52.0 %   MCV 88.9 78.0 - 100.0 fL   MCH 29.8 26.0 - 34.0 pg   MCHC 33.6 30.0 - 36.0 g/dL   RDW 14.2 11.5 - 15.5 %   Platelets 179 150 - 400 K/uL  Basic metabolic panel     Status: Abnormal   Collection Time: 05/09/14  4:23 AM  Result Value Ref Range   Sodium 139 135 - 145 mmol/L   Potassium 4.5 3.5 - 5.1 mmol/L   Chloride 108 96 - 112 mEq/L   CO2 28 19 - 32 mmol/L   Glucose, Bld 134 (H) 70 - 99 mg/dL   BUN 18 6 - 23 mg/dL   Creatinine, Ser 1.19 0.50 -  1.35 mg/dL   Calcium 8.8 8.4 - 10.5 mg/dL   GFR calc non Af Amer 68 (L) >90 mL/min   GFR calc Af Amer 79 (L) >90 mL/min   Anion gap 3 (L) 5 - 15  Glucose, capillary     Status: Abnormal   Collection Time: 05/09/14  7:42 AM  Result Value Ref Range   Glucose-Capillary 111 (H) 70 - 99 mg/dL  Glucose, capillary     Status: Abnormal   Collection Time: 05/09/14 11:56 AM  Result Value Ref Range   Glucose-Capillary 113 (H) 70 - 99 mg/dL    Intake/Output Summary (Last 24 hours) at 05/09/14 1256 Last data filed at 05/09/14 0944  Gross per 24 hour  Intake 3064.17 ml  Output   2800 ml  Net 264.17 ml     ASSESSMENT AND PLAN:  TACHYCARDIA:  Improved.  Discontinue tele.    CAD:  Back on ASA.  Agree with restarting beta blocker PO tomorrow per Dr. Dalbert Batman.  He was on 25 mg tartrate bid.  I would resume this.  If his BP is up we can also resume the lisinopril.    Jeneen Rinks [facility] 05/09/2014 12:56 PM

## 2014-05-09 NOTE — Progress Notes (Addendum)
2 Days Post-Op  Subjective: Doing better. Pain better controlled after Tylenol infusion. No respiratory problems. No chest pain. Denies nausea. Still sore. Getting up to chair. Heart rate 99. BP 147/72. SPO2 95% on nasal cannula. Hemoglobin 13.4. WBC 15,200. Potassium 4.5. Creatinine 1.19. Glucose 134... Better control.  Objective: Vital signs in last 24 hours: Temp:  [97.9 F (36.6 C)-98.8 F (37.1 C)] 98.2 F (36.8 C) (01/20 0622) Pulse Rate:  [91-108] 108 (01/20 0622) Resp:  [12-19] 19 (01/20 0622) BP: (117-147)/(60-82) 147/72 mmHg (01/20 0622) SpO2:  [94 %-98 %] 95 % (01/20 0622) Last BM Date: 05/06/14  Intake/Output from previous day: 01/19 0701 - 01/20 0700 In: 2834.2 [P.O.:130; I.V.:2704.2] Out: 1085 [Urine:700; Emesis/NG output:200; Drains:185] Intake/Output this shift: Total I/O In: 1325 [I.V.:1325] Out: 885 [Urine:700; Drains:185]  General appearance: Alert. Cooperative. Less distress. Resp: clear to auscultation bilaterally GI: Soft. Nondistended. Active bowel sounds. Appropriately tender. Midline wound looks good. JP drains both functioning, watery serosanguineous fluid  Lab Results:  Results for orders placed or performed during the hospital encounter of 05/07/14 (from the past 24 hour(s))  Glucose, capillary     Status: Abnormal   Collection Time: 05/08/14  8:19 AM  Result Value Ref Range   Glucose-Capillary 167 (H) 70 - 99 mg/dL  Glucose, capillary     Status: Abnormal   Collection Time: 05/08/14 12:30 PM  Result Value Ref Range   Glucose-Capillary 179 (H) 70 - 99 mg/dL  Glucose, capillary     Status: Abnormal   Collection Time: 05/08/14  4:36 PM  Result Value Ref Range   Glucose-Capillary 131 (H) 70 - 99 mg/dL  Glucose, capillary     Status: Abnormal   Collection Time: 05/08/14  8:11 PM  Result Value Ref Range   Glucose-Capillary 122 (H) 70 - 99 mg/dL  Glucose, capillary     Status: Abnormal   Collection Time: 05/09/14 12:09 AM  Result Value Ref  Range   Glucose-Capillary 125 (H) 70 - 99 mg/dL   Comment 1 Notify RN    Comment 2 Documented in Chart   Glucose, capillary     Status: Abnormal   Collection Time: 05/09/14  4:08 AM  Result Value Ref Range   Glucose-Capillary 122 (H) 70 - 99 mg/dL   Comment 1 Notify RN    Comment 2 Documented in Chart   CBC     Status: Abnormal   Collection Time: 05/09/14  4:23 AM  Result Value Ref Range   WBC 15.2 (H) 4.0 - 10.5 K/uL   RBC 4.49 4.22 - 5.81 MIL/uL   Hemoglobin 13.4 13.0 - 17.0 g/dL   HCT 39.9 39.0 - 52.0 %   MCV 88.9 78.0 - 100.0 fL   MCH 29.8 26.0 - 34.0 pg   MCHC 33.6 30.0 - 36.0 g/dL   RDW 14.2 11.5 - 15.5 %   Platelets 179 150 - 400 K/uL  Basic metabolic panel     Status: Abnormal   Collection Time: 05/09/14  4:23 AM  Result Value Ref Range   Sodium 139 135 - 145 mmol/L   Potassium 4.5 3.5 - 5.1 mmol/L   Chloride 108 96 - 112 mEq/L   CO2 28 19 - 32 mmol/L   Glucose, Bld 134 (H) 70 - 99 mg/dL   BUN 18 6 - 23 mg/dL   Creatinine, Ser 1.19 0.50 - 1.35 mg/dL   Calcium 8.8 8.4 - 10.5 mg/dL   GFR calc non Af Amer 68 (L) >90 mL/min  GFR calc Af Amer 79 (L) >90 mL/min   Anion gap 3 (L) 5 - 15     Studies/Results: No results found.  Marland Kitchen aspirin EC  81 mg Oral Daily  . enoxaparin (LOVENOX) injection  60 mg Subcutaneous Q24H  . fluticasone  1 spray Each Nare Daily  . insulin aspart  0-20 Units Subcutaneous 6 times per day  . metoprolol  5 mg Intravenous 4 times per day  . morphine   Intravenous 6 times per day  . pantoprazole (PROTONIX) IV  40 mg Intravenous QHS     Assessment/Plan: s/p Procedure(s): REPAIR RECURRENT INCISIONAL HERNIA INSERTION OF MESH   POD #2. Laparotomy, extensive lysis of adhesions, repair complex incarcerated ventral hernia with mesh, component separation technique on left. No apparent wound or surgical problems Discontinue Foley Clamping NG tube. Allow sips of clear liquids.  ambulate in hall. Rolling walker ordered.  Sinus tachycardia.  Better with increase in beta blocker and pain control and fluid bolus  Glucose intolerance. Better control with SSI.  Coronary artery disease with history STEMI with DES to distal RCA April 2014 Continue IV beta blocker another day.  Possible switch to po meds tomorrow. Postop EKG shows sinus tachycardia but otherwise no acute change. Hold Brelinta indefinitely but resume aspirin.  VTE prophylaxis. On Lovenox 60 mg subcutaneous daily.  BMI 39  History CVA Sleep apnea HTN  @PROBHOSP @  LOS: 2 days    Jacob Marks M 05/09/2014  . .prob

## 2014-05-10 LAB — GLUCOSE, CAPILLARY
GLUCOSE-CAPILLARY: 107 mg/dL — AB (ref 70–99)
GLUCOSE-CAPILLARY: 116 mg/dL — AB (ref 70–99)
GLUCOSE-CAPILLARY: 125 mg/dL — AB (ref 70–99)
Glucose-Capillary: 106 mg/dL — ABNORMAL HIGH (ref 70–99)
Glucose-Capillary: 114 mg/dL — ABNORMAL HIGH (ref 70–99)
Glucose-Capillary: 118 mg/dL — ABNORMAL HIGH (ref 70–99)

## 2014-05-10 LAB — CBC
HEMATOCRIT: 36.2 % — AB (ref 39.0–52.0)
Hemoglobin: 12.3 g/dL — ABNORMAL LOW (ref 13.0–17.0)
MCH: 30.2 pg (ref 26.0–34.0)
MCHC: 34 g/dL (ref 30.0–36.0)
MCV: 88.9 fL (ref 78.0–100.0)
Platelets: 147 10*3/uL — ABNORMAL LOW (ref 150–400)
RBC: 4.07 MIL/uL — ABNORMAL LOW (ref 4.22–5.81)
RDW: 14.1 % (ref 11.5–15.5)
WBC: 9.4 10*3/uL (ref 4.0–10.5)

## 2014-05-10 LAB — BASIC METABOLIC PANEL
ANION GAP: 7 (ref 5–15)
BUN: 15 mg/dL (ref 6–23)
CALCIUM: 8.3 mg/dL — AB (ref 8.4–10.5)
CO2: 26 mmol/L (ref 19–32)
CREATININE: 0.87 mg/dL (ref 0.50–1.35)
Chloride: 107 mEq/L (ref 96–112)
Glucose, Bld: 118 mg/dL — ABNORMAL HIGH (ref 70–99)
POTASSIUM: 3.8 mmol/L (ref 3.5–5.1)
SODIUM: 140 mmol/L (ref 135–145)

## 2014-05-10 MED ORDER — METOPROLOL TARTRATE 25 MG PO TABS
25.0000 mg | ORAL_TABLET | Freq: Every morning | ORAL | Status: DC
  Administered 2014-05-10 – 2014-05-12 (×3): 25 mg via ORAL
  Filled 2014-05-10 (×3): qty 1

## 2014-05-10 MED ORDER — ASPIRIN 325 MG PO TABS
325.0000 mg | ORAL_TABLET | Freq: Every day | ORAL | Status: DC
  Administered 2014-05-10 – 2014-05-12 (×3): 325 mg via ORAL
  Filled 2014-05-10 (×3): qty 1

## 2014-05-10 MED ORDER — PANTOPRAZOLE SODIUM 40 MG PO TBEC
40.0000 mg | DELAYED_RELEASE_TABLET | Freq: Two times a day (BID) | ORAL | Status: DC
  Administered 2014-05-10 – 2014-05-12 (×5): 40 mg via ORAL
  Filled 2014-05-10 (×6): qty 1

## 2014-05-10 MED ORDER — OXYCODONE HCL 5 MG PO TABS
10.0000 mg | ORAL_TABLET | ORAL | Status: DC | PRN
  Administered 2014-05-10 – 2014-05-12 (×7): 10 mg via ORAL
  Filled 2014-05-10 (×8): qty 2

## 2014-05-10 MED ORDER — HYDROMORPHONE HCL 1 MG/ML IJ SOLN: 1.0000 mg | INTRAMUSCULAR | Status: DC | PRN

## 2014-05-10 MED ORDER — ATORVASTATIN CALCIUM 40 MG PO TABS
40.0000 mg | ORAL_TABLET | Freq: Every day | ORAL | Status: DC
  Administered 2014-05-10 – 2014-05-11 (×2): 40 mg via ORAL
  Filled 2014-05-10 (×3): qty 1

## 2014-05-10 MED ORDER — LISINOPRIL 10 MG PO TABS
10.0000 mg | ORAL_TABLET | Freq: Every day | ORAL | Status: DC
  Administered 2014-05-10 – 2014-05-12 (×3): 10 mg via ORAL
  Filled 2014-05-10 (×3): qty 1

## 2014-05-10 MED ORDER — VITAMIN B-6 100 MG PO TABS
200.0000 mg | ORAL_TABLET | Freq: Every day | ORAL | Status: DC
  Administered 2014-05-10 – 2014-05-12 (×3): 200 mg via ORAL
  Filled 2014-05-10 (×3): qty 2

## 2014-05-10 NOTE — Progress Notes (Signed)
Patient Profile: 54 y/o male with h/o CAD s/p coronary stenting in 2014 admitted for incisional hernia repair 1/181/6.    Subjective: Doing well. No complaints. NGT removed earlier today. On clear liquid diet. Tolerated PO meds ok this am. No n/v.   Objective: Vital signs in last 24 hours: Temp:  [97.7 F (36.5 C)-99.3 F (37.4 C)] 98.5 F (36.9 C) (01/21 0600) Pulse Rate:  [92-104] 99 (01/21 0600) Resp:  [16-20] 16 (01/21 0600) BP: (127-152)/(73-86) 152/86 mmHg (01/21 0600) SpO2:  [93 %-97 %] 94 % (01/21 0600) Last BM Date: 05/09/14  Intake/Output from previous day: 01/20 0701 - 01/21 0700 In: 4085 [P.O.:960; I.V.:3125] Out: 690 [Emesis/NG output:410; Drains:280] Intake/Output this shift:    Medications Current Facility-Administered Medications  Medication Dose Route Frequency Provider Last Rate Last Dose  . 0.9 % NaCl with KCl 20 mEq/ L  infusion   Intravenous Continuous Fanny Skates, MD 75 mL/hr at 05/10/14 0803    . aspirin tablet 325 mg  325 mg Oral Daily Fanny Skates, MD   325 mg at 05/10/14 1008  . atorvastatin (LIPITOR) tablet 40 mg  40 mg Oral q1800 Fanny Skates, MD      . enoxaparin (LOVENOX) injection 60 mg  60 mg Subcutaneous Q24H Rogue Bussing, RPH   60 mg at 05/10/14 1006  . HYDROmorphone (DILAUDID) injection 1 mg  1 mg Intravenous Q1H PRN Fanny Skates, MD      . insulin aspart (novoLOG) injection 0-20 Units  0-20 Units Subcutaneous 6 times per day Fanny Skates, MD   3 Units at 05/09/14 2012  . lisinopril (PRINIVIL,ZESTRIL) tablet 10 mg  10 mg Oral Daily Fanny Skates, MD   10 mg at 05/10/14 1006  . menthol-cetylpyridinium (CEPACOL) lozenge 3 mg  1 lozenge Oral PRN Fanny Skates, MD      . metoprolol tartrate (LOPRESSOR) tablet 25 mg  25 mg Oral q morning - 10a Fanny Skates, MD   25 mg at 05/10/14 1005  . ondansetron (ZOFRAN) tablet 4 mg  4 mg Oral Q6H PRN Fanny Skates, MD       Or  . ondansetron [facility]) injection 4 mg  4 mg Intravenous  Q6H PRN Fanny Skates, MD      . oxyCODONE (Oxy IR/ROXICODONE) immediate release tablet 10 mg  10 mg Oral Q3H PRN Fanny Skates, MD      . pantoprazole (PROTONIX) EC tablet 40 mg  40 mg Oral BID Fanny Skates, MD   40 mg at 05/10/14 1006  . pyridOXINE (VITAMIN B-6) tablet 200 mg  200 mg Oral Daily Fanny Skates, MD   200 mg at 05/10/14 1006    PE: General appearance: alert, cooperative and no distress Neck: no carotid bruit and no JVD Lungs: clear to auscultation bilaterally Heart: regular rate and rhythm, S1, S2 normal, no murmur, click, rub or gallop Extremities: no LEE Pulses: 2+ and symmetric Skin: warm and dry Neurologic: Grossly normal  Lab Results:   Recent Labs  05/08/14 0408 05/09/14 0423 05/10/14 0500  WBC 16.3* 15.2* 9.4  HGB 15.5 13.4 12.3*  HCT 46.0 39.9 36.2*  PLT 238 179 147*   BMET  Recent Labs  05/08/14 0408 05/09/14 0423 05/10/14 0404  NA 138 139 140  K 4.7 4.5 3.8  CL 108 108 107  CO2 16* 28 26  GLUCOSE 197* 134* 118*  BUN 14 18 15   CREATININE 1.42* 1.19 0.87  CALCIUM 8.6 8.8 8.3*     Assessment/Plan  Principal Problem:  Incarcerated incisional hernia Active Problems:   Testosterone deficiency   Hyperlipidemia   HYPERTENSION, CONTROLLED   CAD (coronary artery disease)  1. Incisional Hernia Repair: POD # 3. Doing well. On clear liquid diet and back on PO meds. Continue post-operative care per surgery.   2. CAD: no chest pain. Since DES was placed > 1 year ago, no need to resume Brilinta. Continue ASA.   3. HTN: lisinopril and metoprolol resumed today and given at 1000. Will have RN recheck BP to assess response and ensure BP is stable.   4. HLD: restart Lipitor tonight now back on PO meds.     LOS: 3 days    Brittainy M. Ladoris Gene 05/10/2014 10:39 AM  History and all data above reviewed.  Patient examined.  I agree with the findings as above.  The patient exam reveals COR:RRR  ,  Lungs: Clear  ,  Abd: Positive bowel  sounds, no rebound no guarding, Ext No edema  .  All available labs, radiology testing, previous records reviewed. Agree with documented assessment and plan. HTN:   Lisinopril restarted.  Follow BP.  Please call us with further questions.    Jeneen Rinks Hines Kloss  11:19 AM  05/10/2014

## 2014-05-10 NOTE — Progress Notes (Addendum)
3 Days Post-Op  Subjective: Doing well. Ambulating in halls. Had a bowel movement. Voiding without difficulty. PO intake 960 mL. BP 152/86. Heart rate 99. Afebrile. SPO2 94% on room air. WBC 9400. Hemoglobin 12.3. Potassium 3.8. Creatinine 0.87. Glucose 118  Cardiology recommendations are very much appreciated.  Objective: Vital signs in last 24 hours: Temp:  [97.7 F (36.5 C)-99.3 F (37.4 C)] 98.5 F (36.9 C) (01/21 0600) Pulse Rate:  [88-104] 99 (01/21 0600) Resp:  [16-20] 16 (01/21 0600) BP: (125-152)/(63-86) 152/86 mmHg (01/21 0600) SpO2:  [93 %-97 %] 94 % (01/21 0600) Last BM Date: 05/09/14  Intake/Output from previous day: 01/20 0701 - 01/21 0700 In: 4085 [P.O.:960; I.V.:3125] Out: 690 [Emesis/NG output:410; Drains:280] Intake/Output this shift:     EXAM: General appearance: Alert and cooperative. Mild anxiety. Skin warm and dry. No physical distress. Resp: clear to auscultation bilaterally Cardio: Abdomen soft. Nondistended. Bowel sounds present. JP drainage thin, serosanguineous, volume decreasing. Midline incision clean. Skin healthy and viable.    Lab Results:  Results for orders placed or performed during the hospital encounter of 05/07/14 (from the past 24 hour(s))  Glucose, capillary     Status: Abnormal   Collection Time: 05/09/14  7:42 AM  Result Value Ref Range   Glucose-Capillary 111 (H) 70 - 99 mg/dL  Glucose, capillary     Status: Abnormal   Collection Time: 05/09/14 11:56 AM  Result Value Ref Range   Glucose-Capillary 113 (H) 70 - 99 mg/dL  Glucose, capillary     Status: Abnormal   Collection Time: 05/09/14  3:53 PM  Result Value Ref Range   Glucose-Capillary 114 (H) 70 - 99 mg/dL  Glucose, capillary     Status: Abnormal   Collection Time: 05/09/14  7:53 PM  Result Value Ref Range   Glucose-Capillary 124 (H) 70 - 99 mg/dL   Comment 1 Notify RN    Comment 2 Documented in Chart   Glucose, capillary     Status: Abnormal   Collection Time:  05/09/14 11:33 PM  Result Value Ref Range   Glucose-Capillary 108 (H) 70 - 99 mg/dL  Glucose, capillary     Status: Abnormal   Collection Time: 05/10/14  3:49 AM  Result Value Ref Range   Glucose-Capillary 106 (H) 70 - 99 mg/dL   Comment 1 Notify RN    Comment 2 Documented in Chart   Basic metabolic panel     Status: Abnormal   Collection Time: 05/10/14  4:04 AM  Result Value Ref Range   Sodium 140 135 - 145 mmol/L   Potassium 3.8 3.5 - 5.1 mmol/L   Chloride 107 96 - 112 mEq/L   CO2 26 19 - 32 mmol/L   Glucose, Bld 118 (H) 70 - 99 mg/dL   BUN 15 6 - 23 mg/dL   Creatinine, Ser 0.87 0.50 - 1.35 mg/dL   Calcium 8.3 (L) 8.4 - 10.5 mg/dL   GFR calc non Af Amer >90 >90 mL/min   GFR calc Af Amer >90 >90 mL/min   Anion gap 7 5 - 15  CBC     Status: Abnormal   Collection Time: 05/10/14  5:00 AM  Result Value Ref Range   WBC 9.4 4.0 - 10.5 K/uL   RBC 4.07 (L) 4.22 - 5.81 MIL/uL   Hemoglobin 12.3 (L) 13.0 - 17.0 g/dL   HCT 36.2 (L) 39.0 - 52.0 %   MCV 88.9 78.0 - 100.0 fL   MCH 30.2 26.0 - 34.0 pg  MCHC 34.0 30.0 - 36.0 g/dL   RDW 14.1 11.5 - 15.5 %   Platelets 147 (L) 150 - 400 K/uL     Studies/Results: No results found.  Marland Kitchen aspirin  325 mg Oral Daily  . atorvastatin  40 mg Oral q1800  . enoxaparin (LOVENOX) injection  60 mg Subcutaneous Q24H  . insulin aspart  0-20 Units Subcutaneous 6 times per day  . lisinopril  10 mg Oral Daily  . metoprolol tartrate  25 mg Oral q morning - 10a  . pantoprazole  40 mg Oral BID  . vitamin B-6  200 mg Oral Daily     Assessment/Plan: s/p Procedure(s): REPAIR RECURRENT INCISIONAL HERNIA INSERTION OF MESH   POD #3. Laparotomy, extensive lysis of adhesions, repair complex incarcerated ventral hernia with mesh, component separation technique on left. No apparent wound or surgical problems Ileus resolving. Discontinue NG. Clear liquid diet DC PCA. IV or PO analgesics as necessary. Ambulate more Decrease IV rate  Sinus tachycardia.  Better with increase in beta blocker and pain control and fluid bolus  Glucose intolerance. Better control with SSI.  Coronary artery disease with history STEMI with DES to distal RCA April 2014 Discontinue IV beta blocker.  restart oral meds including Metroprolol, Lipitor, lisinopril, full dose aspirin, vitamin B 6. We will not resume brilinta  per cardiology recommendations.   VTE prophylaxis. On Lovenox 60 mg subcutaneous daily.  BMI 39  History CVA Sleep apnea HTN  @PROBHOSP @  LOS: 3 days    Omran Keelin M 05/10/2014  . .prob

## 2014-05-11 LAB — GLUCOSE, CAPILLARY
GLUCOSE-CAPILLARY: 103 mg/dL — AB (ref 70–99)
GLUCOSE-CAPILLARY: 116 mg/dL — AB (ref 70–99)
GLUCOSE-CAPILLARY: 120 mg/dL — AB (ref 70–99)
Glucose-Capillary: 102 mg/dL — ABNORMAL HIGH (ref 70–99)
Glucose-Capillary: 113 mg/dL — ABNORMAL HIGH (ref 70–99)

## 2014-05-11 MED ORDER — DIPHENHYDRAMINE HCL 25 MG PO CAPS
25.0000 mg | ORAL_CAPSULE | Freq: Every evening | ORAL | Status: DC | PRN
  Administered 2014-05-11: 25 mg via ORAL
  Filled 2014-05-11: qty 1

## 2014-05-11 NOTE — Progress Notes (Signed)
4 Days Post-Op  Subjective: Continues to improve. Ambulating. Voiding. Tolerating clear liquids without nausea. Occasional hiccups. Pain control reasonable with oxycodone. Urine output not recorded. Drain output 110 mL recorded for 24 hours. Possibly more. Thin serosanguineous, odorless. Heart rate 84. BP 144/80. Afebrile. SPO2 97% on room air. Back on all home cardiac meds.  Objective: Vital signs in last 24 hours: Temp:  [97.8 F (36.6 C)-98.8 F (37.1 C)] 98.8 F (37.1 C) (01/22 0554) Pulse Rate:  [84-91] 84 (01/22 0554) Resp:  [17-19] 18 (01/22 0554) BP: (144-148)/(80-83) 144/80 mmHg (01/22 0554) SpO2:  [94 %-98 %] 97 % (01/22 0554) Last BM Date: 05/10/14  Intake/Output from previous day: 01/21 0701 - 01/22 0700 In: 790 [I.V.:790] Out: 110 [Drains:110] Intake/Output this shift: Total I/O In: 790 [I.V.:790] Out: -   General appearance: Alert. Cooperative. Looks more relaxed. No distress. Resp: clear to auscultation bilaterally GI: Abdomen soft. Nondistended. Active bowel sounds. A little bit of serous drainage from the lower end of the incision but no sign of any infection. Drainage is clear.  Lab Results:  Results for orders placed or performed during the hospital encounter of 05/07/14 (from the past 24 hour(s))  Glucose, capillary     Status: Abnormal   Collection Time: 05/10/14  7:44 AM  Result Value Ref Range   Glucose-Capillary 116 (H) 70 - 99 mg/dL  Glucose, capillary     Status: Abnormal   Collection Time: 05/10/14 11:40 AM  Result Value Ref Range   Glucose-Capillary 125 (H) 70 - 99 mg/dL  Glucose, capillary     Status: Abnormal   Collection Time: 05/10/14  4:48 PM  Result Value Ref Range   Glucose-Capillary 114 (H) 70 - 99 mg/dL  Glucose, capillary     Status: Abnormal   Collection Time: 05/10/14  8:10 PM  Result Value Ref Range   Glucose-Capillary 118 (H) 70 - 99 mg/dL   Comment 1 Notify RN   Glucose, capillary     Status: Abnormal   Collection Time:  05/10/14 11:53 PM  Result Value Ref Range   Glucose-Capillary 107 (H) 70 - 99 mg/dL   Comment 1 Notify RN    Comment 2 Documented in Chart      Studies/Results: No results found.  Marland Kitchen aspirin  325 mg Oral Daily  . atorvastatin  40 mg Oral q1800  . enoxaparin (LOVENOX) injection  60 mg Subcutaneous Q24H  . insulin aspart  0-20 Units Subcutaneous 6 times per day  . lisinopril  10 mg Oral Daily  . metoprolol tartrate  25 mg Oral q morning - 10a  . pantoprazole  40 mg Oral BID  . vitamin B-6  200 mg Oral Daily     Assessment/Plan: s/p Procedure(s): REPAIR RECURRENT INCISIONAL HERNIA INSERTION OF MESH  POD #4. Laparotomy, extensive lysis of adhesions, repair complex incarcerated ventral hernia with mesh, component separation technique on left. No apparent wound or surgical problems Ileus resolving.  Soft diet Possible discharge tomorrow. Nursing to teach drain and wound care. If discharged this weekend, I will see him next Thursday or Friday for wound check and staple removal.  Sinus tachycardia. Resolved  Glucose intolerance. Transient. SSI discontinued  Coronary artery disease with history STEMI with DES to distal RCA April 2014 restart oral meds including Metroprolol, Lipitor, lisinopril, full dose aspirin, vitamin B 6. We will not resume brilinta per cardiology recommendations.   VTE prophylaxis. On Lovenox 60 mg subcutaneous daily.  BMI 39  History CVA Sleep apnea HTN  @  PROBHOSP@  LOS: 4 days    Katlyn Muldrew M 05/11/2014  . .prob

## 2014-05-12 LAB — GLUCOSE, CAPILLARY
GLUCOSE-CAPILLARY: 100 mg/dL — AB (ref 70–99)
GLUCOSE-CAPILLARY: 115 mg/dL — AB (ref 70–99)

## 2014-05-12 MED ORDER — OXYCODONE HCL 10 MG PO TABS: 10.0000 mg | ORAL_TABLET | ORAL | Status: DC | PRN

## 2014-05-12 NOTE — Discharge Summary (Signed)
Physician Discharge Summary [facility]  Patient ID: Jacob Marks MRN: [id number] DOB/AGE: 54-Mar-1962 54 y.o.  Admit date: 05/07/2014 Discharge date: 05/12/2014  Admission Diagnoses:  Ventral incisional hernia  Discharge Diagnoses:  Principal Problem:   Incarcerated incisional hernia Active Problems:   Testosterone deficiency   Hyperlipidemia   HYPERTENSION, CONTROLLED   CAD (coronary artery disease)   Discharged Condition: good  Hospital Course: patient admitted following open repair of ventral incisional hernia with mesh.  Post op course uncomplicated.  Diet advanced.  Pain controlled with oxycodone.  Prepared for discharge on POD#5.  Consults: cardiology  Treatments: surgery: open repair VH with mesh  Discharge Exam: Blood pressure 134/79, pulse 74, temperature 98.3 F (36.8 C), temperature source Oral, resp. rate 16, height 5\' 9"  (1.753 m), weight 266 lb (120.657 kg), SpO2 99 %. HEENT - clear Neck - soft Chest - clear bilaterally Cor - RRR Abd - dressing dry and intact; JP drains with clear serous output  Disposition: Home  Discharge Instructions    Apply dressing    Complete by:  As directed   Instruct patient and wife in wound and drain care prior to discharge home today.  Earnstine Regal, MD, [facility] Office: [phone]     Change dressing (specify)    Complete by:  As directed   Dressing change once daily and as needed.  Drain care as instructed.  Record output and bring record with you to appointment at [place] office.     Diet - low sodium heart healthy    Complete by:  As directed      Discharge instructions    Complete by:  As directed   [facility]  HERNIA REPAIR POST OP INSTRUCTIONS  Always review your discharge instruction sheet given to you by the facility where your surgery was performed.  A  prescription for pain medication may be given to you upon discharge.  Take your pain  medication as prescribed.  If narcotic pain medicine is not needed, then you may take acetaminophen (Tylenol) or ibuprofen (Advil) as needed.  Take your usually prescribed medications unless otherwise directed.  If you need a refill on your pain medication, please contact your pharmacy.  They will contact our office to request authorization. Prescriptions will not be filled after 5 pm daily or on weekends.  You should follow a light diet the first 24 hours after arrival home, such as soup and crackers or toast.  Be sure to include plenty of fluids daily.  Resume your normal diet the day after surgery.  Most patients will experience some swelling and bruising around the surgical site.  Ice packs and reclining will help.  Swelling and bruising can take several days to resolve.   It is common to experience some constipation if taking pain medication after surgery.  Increasing fluid intake and taking a stool softener (such as Colace) will usually help or prevent this problem from occurring.  A mild laxative (Milk of Magnesia or Miralax) should be taken according to package directions if there are no bowel movements after 48 hours.  Unless discharge instructions indicate otherwise, you may remove your bandages 24-48 hours after surgery, and you may shower at that time.  You may have steri-strips (small skin tapes) in place directly over the incision.  These strips should be left on the skin for 7-10 days.  If your surgeon used skin glue on the incision, you may shower in 24  hours.  The glue will flake off over the next 2-3 weeks.  Any sutures or staples will be removed at the office during your follow-up visit.  ACTIVITIES:  You may resume regular (light) daily activities beginning the next day-such as daily self-care, walking, climbing stairs-gradually increasing activities as tolerated.  You may have sexual intercourse when it is comfortable.  Refrain from any heavy lifting or straining until approved by  your doctor.  You may drive when you are no longer taking prescription pain medication, you can comfortably wear a seatbelt, and you can safely maneuver your car and apply brakes.  You should see your doctor in the office for a follow-up appointment approximately 2-3 weeks after your surgery.  Make sure that you call for this appointment within a day or two after you arrive home to insure a convenient appointment time.   WHEN TO CALL YOUR DOCTOR: Fever greater than 101.0 Inability to urinate Persistent nausea and/or vomiting Extreme swelling or bruising Continued bleeding from incision Increased pain, redness, or drainage from the incision  The clinic staff is available to answer your questions during regular business hours.  Please don't hesitate to call and ask to speak to one of the nurses for clinical concerns.  If you have a medical emergency, go to the nearest emergency room or call 911.  A surgeon from [facility] is always on call for the hospital.   [facility] [address], [place], [place], [place]  [address]  [phone] ? [phone] ? FAX [phone]  [email or web address]     Increase activity slowly    Complete by:  As directed             Medication List    TAKE these medications        aspirin EC 81 MG tablet  Take 1 tablet (81 mg total) by mouth daily.     atorvastatin 80 MG tablet  Commonly known as:  LIPITOR  Take 1 tablet (80 mg total) by mouth daily at 6 PM.     lisinopril 10 MG tablet  Commonly known as:  PRINIVIL,ZESTRIL  TAKE ONE TABLET BY MOUTH ONCE DAILY     metoprolol tartrate 25 MG tablet  Commonly known as:  LOPRESSOR  Take 1 tablet (25 mg total) by mouth 2 (two) times daily.     mometasone 50 MCG/ACT nasal spray  Commonly known as:  NASONEX  Place 2 sprays into the nose daily.     Oxycodone HCl 10 MG Tabs  Take 1 tablet (10 mg total) by mouth every 3 (three) hours as needed  for severe pain.     oxyCODONE-acetaminophen 5-325 MG per tablet  Commonly known as:  ROXICET  Take 1 tablet by mouth every 6 (six) hours as needed for severe pain.     pyridoxine 100 MG tablet  Commonly known as:  B-6  Take 100 mg by mouth daily.     tadalafil 5 MG tablet  Commonly known as:  CIALIS  Take 1 tablet (5 mg total) by mouth daily as needed for erectile dysfunction.     ticagrelor 60 MG Tabs tablet  Commonly known as:  BRILINTA  Take 1 tablet (60 mg total) by mouth 2 (two) times daily.     vitamin B-12 500 MCG tablet  Commonly known as:  CYANOCOBALAMIN  Take 500 mcg by mouth daily.     vitamin C 1000 MG tablet  Take 1,000 mg by mouth  daily.           Follow-up Information    Follow up with Adin Hector, MD. Schedule an appointment as soon as possible for a visit in 5 days.   Specialty:  General Surgery   Why:  For wound re-check   Contact information:   [address] [place] [address] [phone]       Giannah Zavadil M. Wyolene Weimann, MD, [facility] Office: [phone]   Signed: Earnstine Regal 05/12/2014, 9:41 AM

## 2014-06-22 ENCOUNTER — Encounter: Payer: Self-pay | Admitting: Internal Medicine

## 2014-07-01 ENCOUNTER — Emergency Department: Payer: Self-pay | Admitting: Emergency Medicine

## 2014-08-10 NOTE — Discharge Summary (Signed)
Dates of Admission and Diagnosis:  Date of Admission 05-Aug-2012   Date of Discharge 07-Aug-2012   Admitting Diagnosis Chest pain, STEMI   Final Diagnosis STEMI   Discharge Diagnosis 1 CAD, s/p PCI to teh distal RCA with DES stent    Chief Complaint/History of Present Illness Presented with sweating and arm pain, some chest pain Elevated cardiac enz Abn EKG   Routine Chem:  18-Apr-14 16:52   Result Comment troponin - RESULTS VERIFIED BY REPEAT TESTING.  - mpg c/ Delsa Bern @ 1287 08/05/12  - READ-BACK PROCESS PERFORMED.  Result(s) reported on 05 Aug 2012 at 05:59PM.    23:50   Result Comment TROPONIN - RESULTS VERIFIED BY REPEAT TESTING.  - HIGH PREVIOUSLY CALLED BY MPG AT 1753  - ON 08-05-12/RWW  Result(s) reported on 06 Aug 2012 at 12:58AM.  Glucose, Serum 96  BUN 18  Creatinine (comp) 1.04  Sodium, Serum 138  Potassium, Serum 3.8  Chloride, Serum  108  CO2, Serum 25  Calcium (Total), Serum  8.3  Anion Gap  5 (Result(s) reported on 06 Aug 2012 at 12:33AM.)  Osmolality (calc) 277  19-Apr-14 08:30   Result Comment TROPONIN - RESULTS VERIFIED BY REPEAT TESTING.  - PREV.CALLED ON 08/05/12 @ 1753 BY MPG/CAF  Result(s) reported on 06 Aug 2012 at 09:26AM.  20-Apr-14 03:43   Cholesterol, Serum 134  Triglycerides, Serum 130  HDL (INHOUSE)  35  VLDL Cholesterol Calculated 26  LDL Cholesterol Calculated 73 (Result(s) reported on 07 Aug 2012 at 05:05AM.)  Cardiac:  18-Apr-14 16:52   Troponin I  3.73 (0.00-0.05 0.05 ng/mL or less: NEGATIVE  Repeat testing in 3-6 hrs  if clinically indicated. >0.05 ng/mL: POTENTIAL  MYOCARDIAL INJURY. Repeat  testing in 3-6 hrs if  clinically indicated. NOTE: An increase or decrease  of 30% or more on serial  testing suggests a  clinically important change)    23:50   Troponin I  3.90 (0.00-0.05 0.05 ng/mL or less: NEGATIVE  Repeat testing in 3-6 hrs  if clinically indicated. >0.05 ng/mL: POTENTIAL  MYOCARDIAL INJURY. Repeat  testing in 3-6 hrs if  clinically indicated. NOTE: An increase or decrease  of 30% or more on serial  testing suggests a  clinically important change)  19-Apr-14 08:30   Troponin I  2.44 (0.00-0.05 0.05 ng/mL or less: NEGATIVE  Repeat testing in 3-6 hrs  if clinically indicated. >0.05 ng/mL: POTENTIAL  MYOCARDIAL INJURY. Repeat  testing in 3-6 hrs if  clinically indicated. NOTE: An increase or decrease  of 30% or more on serial  testing suggests a  clinically important change)   Hospital Course:  Hospital Course Taken to the cardiac cath lab DES stent placed to the distal RCA started on brilinta, metoprolol continued on aspirin 81 mg daily, lisinopril   Condition on Discharge Good   DISCHARGE INSTRUCTIONS HOME MEDS:  Medication Reconciliation: Patient's Home Medications at Discharge:     Medication Instructions  lisinopril 10 mg oral tablet  1 tab(s) orally once a day   atorvastatin 80 mg oral tablet  1 tab(s) orally once a day (at bedtime)   ticagrelor 90 mg oral tablet  1 tab(s) orally every 12 hours   metoprolol tartrate 25 mg oral tablet  1 tab(s) orally 2 times a day   bayer childrens aspirin 81 mg oral tablet, chewable  1 tab(s) orally once a day     Physician's Instructions:  Home Health? No   Treatments None   Home Oxygen? No  Diet Regular   Dietary Supplements None   Diet Consistency Regular Consistency   Activity Limitations No exertional activity  for one week, then start light exercise   Return to Work 2 weeks   Time frame for Follow Up Appointment 1-2 weeks   Time frame for Follow Up Appointment 1-2 weeks   Electronic Signatures: Ida Rogue (MD)  (Signed 20-Apr-14 10:21)  Authored: ADMISSION DATE AND DIAGNOSIS, CHIEF COMPLAINT/HPI, [place], PATIENT INSTRUCTIONS   Last Updated: 20-Apr-14 10:21 by Ida Rogue (MD)

## 2014-08-10 NOTE — H&P (Signed)
PATIENT NAME:  Jacob Marks, Jacob Marks MR#:  [id number] DATE OF BIRTH:  [birth year]  DATE OF ADMISSION:  08/05/2012  PRIMARY CARE PHYSICIAN:  Loura Pardon, MD  CHIEF COMPLAINT: Weakness and sweating.   HISTORY OF PRESENT ILLNESS: This is a 54 year old gentleman with no previous cardiac history. He has multiple chronic medical conditions including hypertension and a stroke in 2012 after a prolonged complicated colon surgery. He had colon resection in 0973 complicated by anastomosis leaking which required extensive ileostomy and was complicated by abdominal infection and wound VAC placement. He presented to the Emergency Room with acute onset of sweating while he was working in the yard. This was associated with shortness of breath and weakness. This happened about 40 minutes before presentation. His wife drove him to the Emergency Room and he was found to have significant inferior ST elevation on his EKG and thus a code STEMI was called. The patient was taken to the cath lab emergently.   PAST MEDICAL HISTORY: 1.  Hypertension.  2.  Colon resection complicated by anastomosis leak.  3.  Previous cerebrovascular accident causing post-stroke aphasia.  4.  Gastroesophageal reflux disease.  5.  Depression.   HOME MEDICATIONS: 1.  Aggrenox once daily.  2.  Lisinopril 5 mg daily.  3.  Protonix 20 mg daily.  4.  Ambien as needed.  5.  Percocet as needed.   ALLERGIES: No known drug allergies.   SOCIAL HISTORY: Negative for smoking, alcohol or recreational drug use. He is married and lives with his wife.   FAMILY HISTORY: Negative for premature coronary artery disease.   REVIEW OF SYSTEMS: A 10 point review was performed. It is negative other than what is mentioned in the HPI.   PHYSICAL EXAMINATION: GENERAL: The patient is obese. He appears to be at his stated age and in no acute distress.  VITAL SIGNS: He is afebrile with a blood pressure of 110/70. Pulse is 80 beats per minute.  HEENT: Normocephalic,  atraumatic.  NECK: No jugular venous distention or carotid bruits.  LUNGS: Normal respiratory effort with no use of accessory muscles. Auscultation reveals normal breath sounds.  HEART:  Normal PMI. Normal S1 and S2 with no gallops or murmurs.  ABDOMEN: Benign, nontender and nondistended.  EXTREMITIES: No clubbing, cyanosis or edema.  SKIN: Warm and dry with no rash.  PSYCHIATRIC: He is alert and oriented x 3 with normal mood and affect.   LABORATORY AND DIAGNOSTICS:  His labs are still pending.   EKG showed normal sinus rhythm with significant inferior ST elevation with reciprocal anterior changes.   IMPRESSION: 1.  Acute inferior ST elevation myocardial infarction.  2.  Previous history of stroke after complications from colon resection.  3.  Hypertension.   RECOMMENDATIONS: The patient is having acute inferior ST elevation myocardial infarction. Emergent cardiac catheterization is recommended. Risks, benefits and alternatives were discussed with the patient and we will be proceeding. Post-myocardial care will be recommended after his cardiac catheterization.  ____________________________ Mertie Clause Fletcher Anon, MD maa:sb D: 08/05/2012 12:28:53 ET    T: 08/05/2012 12:49:05 ET       JOB#: [id number] cc: Gaylen Pereira A. Fletcher Anon, MD, <Dictator> Marne A. Glori Bickers, MD Rogue Jury Ferne Reus MD ELECTRONICALLY SIGNED 08/19/2012 10:49

## 2014-08-11 NOTE — H&P (Signed)
Subjective/Chief Complaint abdominal pain and nausea for 2 days   History of Present Illness 54 y/o male with complex past medical and surgical history.  July 2012 had left colectomy for recurrent diverticulitis complicated by anastomotic leak needing diverting loop ileostomy.  Had significant morbidity related to dehydration and midline wound infection needing multiple VAC exchanges.  Also had a CVA and more recently in April 2014 had STEMI needing DES.   Had ileosomy reveresed 2013 at [facility].  Was found around time of work-up for ventral hernia an incidental mass on right kidney which is being followed.  He describes long history of palpable and reducible peri-umbilical hernia with audible reduction of bowel with manipulation.  This Saturday he began having increased pain around hernia and mild nausea,  No emesis and he is continuing to pass both stools and flatus.  No fevers, no sick contacts.   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 kidney stones   Past Medical Health Coronary Artery Disease, Hypertension, Stroke   Code Status Full Code   Past Med/Surgical Hx:  Multi-drug Resistant Organism (MDRO): Positive culture for MRSA.  H/O Kidney Stones:   Recent diverticulitis:   Sleep Apnea/CPAP [Rarely uses]:   HOH:   H/O PUD as a teen:   HTN:   Heart Murmur:   Colon Resection:   Tonsillectomy and Adenoidectomy:   Vasectomy:   ALLERGIES:  No Known Allergies:    Other Allergies none   HOME MEDICATIONS: Medication Instructions Status  traMADol 50 mg tablet 1 tab(s) orally every 4 hours x 3 days, As Needed- for Pain  Active  atorvastatin 80 mg oral tablet 1 tab(s) orally once a day (at bedtime) Active  ticagrelor 90 mg oral tablet 1 tab(s) orally every 12 hours Active  metoprolol tartrate 25 mg oral tablet 1 tab(s) orally 2 times a day Active  Bayer Childrens Aspirin 81 mg oral tablet, chewable 1 tab(s) orally once a day Active  lisinopril  10 mg oral tablet 1 tab(s) orally once a day Active   Family and Social History:  Family History Non-Contributory   Social History negative tobacco, negative ETOH, negative Illicit drugs   Place of Living Home   Review of Systems:  Subjective/Chief Complaint see above.   Physical Exam:  GEN no acute distress, obese, temp 97.5 p 88 rr 18 bp 154/81   HEENT pale conjunctivae, PERRL   NECK supple   RESP normal resp effort   CARD regular rate   ABD denies tenderness  no liver/spleen enlargement  positive hernia  soft  partially reducible right peri-umbilical ventral hernia, non-tender.  wide complex scars midline and in RLQ.   SKIN normal to palpation, No rashes   NEURO cranial nerves intact   PSYCH A+O to time, place, person, good insight   Lab Results: Hepatic:  09-Nov-15 18:01   Bilirubin, Total 0.8  Alkaline Phosphatase 105 (46-116 NOTE: New Reference Range 11/07/13)  SGPT (ALT) 45 (14-63 NOTE: New Reference Range 11/07/13)  SGOT (AST) 35  Total Protein, Serum 7.1  Albumin, Serum 3.9  Routine Chem:  09-Nov-15 18:01   Lipase 144 (Result(s) reported on 26 Feb 2014 at 08:07PM.)  Glucose, Serum  141  BUN 16  Creatinine (comp) 1.22  Sodium, Serum 142  Potassium, Serum 3.7  Chloride, Serum 105  CO2, Serum 30  Calcium (Total), Serum  8.4  Osmolality (calc) 287  eGFR (African American) >60  eGFR (Non-African American) >60 (eGFR values <22m/min/1.73 m2 may  be an indication of chronic kidney disease (CKD). Calculated eGFR, using the MRDR Study equation, is useful in  patients with stable renal function. The eGFR calculation will not be reliable in acutely ill patients when serum creatinine is changing rapidly. It is not useful in patients on dialysis. The eGFR calculation may not be applicable to patients at the low and high extremes of body sizes, pregnant women, and vegetarians.)  Anion Gap 7  Cardiac:  09-Nov-15 18:01   Troponin I < 0.02  (0.00-0.05 0.05 ng/mL or less: NEGATIVE  Repeat testing in 3-6 hrs  if clinically indicated. >0.05 ng/mL: POTENTIAL  MYOCARDIAL INJURY. Repeat  testing in 3-6 hrs if  clinically indicated. NOTE: An increase or decrease  of 30% or more on serial  testing suggests a  clinically important change)  Routine UA:  09-Nov-15 18:01   Color (UA) Yellow  Clarity (UA) Clear  Glucose (UA) Negative  Bilirubin (UA) Negative  Ketones (UA) Negative  Specific Gravity (UA) 1.020  Blood (UA) Negative  pH (UA) 5.0  Protein (UA) Negative  Nitrite (UA) Negative  Leukocyte Esterase (UA) Negative (Result(s) reported on 26 Feb 2014 at 06:23PM.)  RBC (UA) 3 /HPF  WBC (UA) 1 /HPF  Bacteria (UA) NONE SEEN  Epithelial Cells (UA) 1 /HPF  Mucous (UA) PRESENT (Result(s) reported on 26 Feb 2014 at 06:23PM.)  Routine Hem:  09-Nov-15 18:01   WBC (CBC) 8.2  RBC (CBC) 5.16  Hemoglobin (CBC) 15.8  Hematocrit (CBC) 46.6  Platelet Count (CBC) 164  MCV 90  MCH 30.6  MCHC 33.9  RDW 13.6  Neutrophil % 59.4  Lymphocyte % 23.4  Monocyte % 10.9  Eosinophil % 5.5  Basophil % 0.8  Neutrophil # 4.8  Lymphocyte # 1.9  Monocyte # 0.9  Eosinophil # 0.4  Basophil # 0.1 (Result(s) reported on 26 Feb 2014 at [facility].)   Radiology Results: LabUnknown:    09-Nov-15 21:11, CT Abdomen and Pelvis With Contrast  PACS Image  CT:  CT Abdomen and Pelvis With Contrast  REASON FOR EXAM:    (1) central cramping abd pain and decreased stooling,   hx of bowel resection and  COMMENTS:       PROCEDURE: CT  - CT ABDOMEN / PELVIS  W  - Feb 26 2014  9:11PM     CLINICAL DATA:  Diffuse abdominal pain and nausea for three views.  History of kidney stones, diverticulitis, colon resection and  ileostomy. Past history of probable kidney cancer.    EXAM:  CT ABDOMEN AND PELVIS WITH CONTRAST    TECHNIQUE:  Multidetector CT imaging of the abdomen and pelvis was performed  using the standard protocol following bolus  administration of  intravenous contrast.    CONTRAST:  125 cc of Isovue 370    COMPARISON:  CT of the abdomen pelvis May 08, 2011    FINDINGS:  LUNG BASES: Included view of the lung bases are clear. Visualized  heart and pericardium are unremarkable.    SOLID ORGANS: The gallbladder, pancreas and RIGHT adrenal gland are  unremarkable. Multiple low-density cysts in the liver again noted,  largest measuring 13 mm. 15 mm hypodensity in the spleen again  noted, which may reflect a lymphangioma. Thickened LEFT adrenal  gland may reflect hyperplasia, similar.    GASTROINTESTINAL TRACT: The stomach, large bowel are normal in  caliber without inflammatory changes. RIGHT ventral 4 cm neck hernia  containing a loop of small bowel. Small bowel proximal to this is  2.9 cm. Enteric contrast has not yet reached the distal small bowel.  RIGHT lower quadrant small bowel surgical anastomosis. Mild  descending colonic diverticulosis. The appendix is not discretely  identified, however there are no inflammatory changes in the right  lower quadrant.    KIDNEYS/ URINARY TRACT: Kidneys are orthotopic, demonstrating  symmetric enhancement. Too small to characterize hypodensities in  the kidneys bilaterally. 4 mm bilateral lower pole nephrolithiasis.  No hydronephrosis or solid renal masses. The unopacified ureters are  normal in course and caliber. Exophytic 15 mm mildly dense cyst is  similar on delayed phase may reflect hemorrhagic cysts. Delayed  imaging through the kidneys demonstrates symmetric prompt contrast  excretion within the proximal urinary collecting system. Urinary  bladder is partially distended and unremarkable.    PERITONEUM/RETROPERITONEUM: No intraperitoneal free fluid nor free  air. Aortoiliac vessels are normal in course and caliber, moderate  calcific atherosclerosis. No lymphadenopathy by CT size criteria.  Internal reproductive organs are unremarkable.    SOFT  TISSUE/OSSEOUS STRUCTURES: Nonsuspicious. Thinly capsulated 10  cm fatty mass contiguous with the LEFT sartorius muscle is slightly  larger, and could reflect lipoma or benign fatty tumor. RIGHT lower  quadrant ileostomy scar with mild diastases, no hernia.   IMPRESSION:  Interval RIGHT ileostomy takedown. New small RIGHT ventral hernia  containing small bowel and resultant low grade or partial mechanical  bowel obstruction.    Multiple surgical bowel anastomoses. Descending colonic  diverticulosis.    Nonobstructing bilateral lower pole nephrolithiasis.      Electronically Signed    By: Elon Alas    On: 02/26/2014 21:55     Verified By: Ricky Ala, M.D.,    Assessment/Admission Diagnosis 54 y/o obese male with incisional ventral hernia and PSBO, likely chronic process No acute indication for surgical intervention   Plan Admit, hydrate, pain and nausea control.. I discussed with patient and his wife admission and if he improves outpatient referral back to [facility] for repair of hernia which is their wishes. if clinical picture changes he understands he will need more urgent repair.  Best to avoid synthetic mesh in this patient given history of extensive prior infection/obesity. total time spent 60 minutes   Electronic Signatures: Sherri Rad (MD)  (Signed [phone] 23:15)  Authored: CHIEF COMPLAINT and HISTORY, PAST MEDICAL/SURGIAL HISTORY, ALLERGIES, Other Allergies, HOME MEDICATIONS, FAMILY AND SOCIAL HISTORY, REVIEW OF SYSTEMS, PHYSICAL EXAM, LABS, Radiology, ASSESSMENT AND PLAN   Last Updated: 09-Nov-15 23:15 by Sherri Rad (MD)

## 2014-09-24 ENCOUNTER — Other Ambulatory Visit: Payer: Self-pay

## 2014-09-25 MED ORDER — LISINOPRIL 10 MG PO TABS: ORAL_TABLET | ORAL | Status: DC

## 2014-11-22 ENCOUNTER — Emergency Department
Admission: EM | Admit: 2014-11-22 | Discharge: 2014-11-22 | Disposition: A | Discharge status: Home / Self Care | Payer: BC Managed Care – PPO | Attending: Emergency Medicine | Admitting: Emergency Medicine

## 2014-11-22 ENCOUNTER — Emergency Department: Payer: BC Managed Care – PPO

## 2014-11-22 ENCOUNTER — Encounter: Payer: Self-pay | Admitting: Urgent Care

## 2014-11-22 DIAGNOSIS — N23 Unspecified renal colic: Secondary | ICD-10-CM | POA: Diagnosis not present

## 2014-11-22 DIAGNOSIS — Z7982 Long term (current) use of aspirin: Secondary | ICD-10-CM | POA: Insufficient documentation

## 2014-11-22 DIAGNOSIS — Z87891 Personal history of nicotine dependence: Secondary | ICD-10-CM | POA: Diagnosis not present

## 2014-11-22 DIAGNOSIS — N189 Chronic kidney disease, unspecified: Secondary | ICD-10-CM | POA: Insufficient documentation

## 2014-11-22 DIAGNOSIS — R109 Unspecified abdominal pain: Secondary | ICD-10-CM | POA: Diagnosis present

## 2014-11-22 DIAGNOSIS — Z79899 Other long term (current) drug therapy: Secondary | ICD-10-CM | POA: Insufficient documentation

## 2014-11-22 DIAGNOSIS — Z85528 Personal history of other malignant neoplasm of kidney: Secondary | ICD-10-CM | POA: Insufficient documentation

## 2014-11-22 DIAGNOSIS — I129 Hypertensive chronic kidney disease with stage 1 through stage 4 chronic kidney disease, or unspecified chronic kidney disease: Secondary | ICD-10-CM | POA: Diagnosis not present

## 2014-11-22 LAB — URINALYSIS COMPLETE WITH MICROSCOPIC (ARMC ONLY)
BILIRUBIN URINE: NEGATIVE
Bacteria, UA: NONE SEEN
GLUCOSE, UA: NEGATIVE mg/dL
KETONES UR: NEGATIVE mg/dL
Leukocytes, UA: NEGATIVE
Nitrite: NEGATIVE
PH: 6 (ref 5.0–8.0)
Protein, ur: 100 mg/dL — AB
SPECIFIC GRAVITY, URINE: 1.02 (ref 1.005–1.030)

## 2014-11-22 MED ORDER — KETOROLAC TROMETHAMINE 60 MG/2ML IM SOLN
60.0000 mg | Freq: Once | INTRAMUSCULAR | Status: AC
  Administered 2014-11-22: 60 mg via INTRAMUSCULAR
  Filled 2014-11-22: qty 2

## 2014-11-22 MED ORDER — OXYCODONE-ACETAMINOPHEN 5-325 MG PO TABS: 1.0000 | ORAL_TABLET | Freq: Four times a day (QID) | ORAL | Status: DC | PRN

## 2014-11-22 MED ORDER — NAPROXEN 250 MG PO TABS: 250.0000 mg | ORAL_TABLET | Freq: Two times a day (BID) | ORAL | Status: DC

## 2014-11-22 MED ORDER — ONDANSETRON 8 MG PO TBDP: 8.0000 mg | ORAL_TABLET | Freq: Three times a day (TID) | ORAL | Status: DC | PRN

## 2014-11-22 MED ORDER — PHENAZOPYRIDINE HCL 200 MG PO TABS: 200.0000 mg | ORAL_TABLET | Freq: Three times a day (TID) | ORAL | Status: DC | PRN

## 2014-11-22 NOTE — Discharge Instructions (Signed)
You were prescribed a medication that is potentially sedating. Do not drink alcohol, drive or participate in any other potentially dangerous activities while taking this medication as it may make you sleepy. Do not take this medication with any other sedating medications, either prescription or over-the-counter. If you were prescribed Percocet or Vicodin, do not take these with acetaminophen (Tylenol) as it is already contained within these medications.   Opioid pain medications (or "narcotics") can be habit forming.  Use it as little as possible to achieve adequate pain control.  Do not use or use it with extreme caution if you have a history of opiate abuse or dependence.  If you are on a pain contract with your primary care doctor or a pain specialist, be sure to let them know you were prescribed this medication today from the [facility] Department.  This medication is intended for your use only - do not give any to anyone else and keep it in a secure place where nobody else, especially children and pets, have access to it.  It will also cause or worsen constipation, so you may want to consider taking an over-the-counter stool softener while you are taking this medication.   Kidney Stones Kidney stones (urolithiasis) are deposits that form inside your kidneys. The intense pain is caused by the stone moving through the urinary tract. When the stone moves, the ureter goes into spasm around the stone. The stone is usually passed in the urine.  CAUSES   A disorder that makes certain neck glands produce too much parathyroid hormone (primary hyperparathyroidism).  A buildup of uric acid crystals, similar to gout in your joints.  Narrowing (stricture) of the ureter.  A kidney obstruction present at birth (congenital obstruction).  Previous surgery on the kidney or ureters.  Numerous kidney infections. SYMPTOMS   Feeling sick to your stomach (nauseous).  Throwing up  (vomiting).  Blood in the urine (hematuria).  Pain that usually spreads (radiates) to the groin.  Frequency or urgency of urination. DIAGNOSIS   Taking a history and physical exam.  Blood or urine tests.  CT scan.  Occasionally, an examination of the inside of the urinary bladder (cystoscopy) is performed. TREATMENT   Observation.  Increasing your fluid intake.  Extracorporeal shock wave lithotripsy--This is a noninvasive procedure that uses shock waves to break up kidney stones.  Surgery may be needed if you have severe pain or persistent obstruction. There are various surgical procedures. Most of the procedures are performed with the use of small instruments. Only small incisions are needed to accommodate these instruments, so recovery time is minimized. The size, location, and chemical composition are all important variables that will determine the proper choice of action for you. Talk to your health care provider to better understand your situation so that you will minimize the risk of injury to yourself and your kidney.  HOME CARE INSTRUCTIONS   Drink enough water and fluids to keep your urine clear or pale yellow. This will help you to pass the stone or stone fragments.  Strain all urine through the provided strainer. Keep all particulate matter and stones for your health care provider to see. The stone causing the pain may be as small as a grain of salt. It is very important to use the strainer each and every time you pass your urine. The collection of your stone will allow your health care provider to analyze it and verify that a stone has actually passed. The stone analysis will  often identify what you can do to reduce the incidence of recurrences.  Only take over-the-counter or prescription medicines for pain, discomfort, or fever as directed by your health care provider.  Make a follow-up appointment with your health care provider as directed.  Get follow-up X-rays if  required. The absence of pain does not always mean that the stone has passed. It may have only stopped moving. If the urine remains completely obstructed, it can cause loss of kidney function or even complete destruction of the kidney. It is your responsibility to make sure X-rays and follow-ups are completed. Ultrasounds of the kidney can show blockages and the status of the kidney. Ultrasounds are not associated with any radiation and can be performed easily in a matter of minutes. SEEK MEDICAL CARE IF:  You experience pain that is progressive and unresponsive to any pain medicine you have been prescribed. SEEK IMMEDIATE MEDICAL CARE IF:   Pain cannot be controlled with the prescribed medicine.  You have a fever or shaking chills.  The severity or intensity of pain increases over 18 hours and is not relieved by pain medicine.  You develop a new onset of abdominal pain.  You feel faint or pass out.  You are unable to urinate. MAKE SURE YOU:   Understand these instructions.  Will watch your condition.  Will get help right away if you are not doing well or get worse. Document Released: 04/06/2005 Document Revised: 12/07/2012 Document Reviewed: 09/07/2012 [facility] Patient Information 2015 [place], [place]. This information is not intended to replace advice given to you by your health care provider. Make sure you discuss any questions you have with your health care provider.

## 2014-11-22 NOTE — ED Notes (Signed)
MD at bedside. 

## 2014-11-22 NOTE — ED Provider Notes (Signed)
[facility] Emergency Department Provider Note  ____________________________________________  Time seen: 7:40 PM  I have reviewed the triage vital signs and the nursing notes.   HISTORY  Chief Complaint Flank Pain    HPI Jacob Marks is a 54 y.o. male who presents with right flank pain and suprapubic pain with dysuria today. He states he's had a history of kidney stones in the past, at times causing obstruction and requiring intervention. He also has a history of right renal tumor that is followed by [facility] and under consideration for partial nephrectomy currently. Fever chills nausea vomiting diarrhea chest pain shortness of breath or syncope.     Past Medical History  Diagnosis Date  . Hypertension   . Hyperlipidemia   . Testosterone deficiency   . ED (erectile dysfunction)   . Pes planus   . Heart murmur     as a child  . Diverticulosis 7/12    with hemicolectomy-- complications   . CVA (cerebral infarction) 10/2010    after his hemicolectomy; "some paralysis right side of mouth and slight speech problem as a result" (05/07/2014)  . Stroke   . Coronary artery disease   . History of kidney cancer     lesion on the right kidney  . STEMI (ST elevation myocardial infarction) 07/2012  . Childhood asthma   . OSA (obstructive sleep apnea)     "has CPAP; doesn't wear it" (05/07/2014)  . Chronic kidney disease     renal cyst right  . Kidney stones     Patient Active Problem List   Diagnosis Date Noted  . CAD (coronary artery disease) 05/07/2014  . Incarcerated incisional hernia 04/23/2014  . Erectile dysfunction 02/21/2014  . Allergic rhinitis 09/04/2013  . Vertigo, benign paroxysmal 09/04/2013  . STEMI (ST elevation myocardial infarction) 08/12/2012  . Chest pain 03/21/2012  . GERD (gastroesophageal reflux disease) 03/21/2012  . Dyspnea 06/09/2011  . Post-nasal drip 06/09/2011  . Kidney stone 06/09/2011  . Anxiety 06/09/2011  .  Dysphagia 03/19/2011  . Diverticulosis 01/04/2011  . Wound infection after surgery 01/04/2011  . CVA (cerebral infarction) 01/02/2011  . Depression 01/02/2011  . Anemia 01/02/2011  . Fatigue 08/05/2010  . Prostate cancer screening 08/05/2010  . TINEA VERSICOLOR 01/15/2010  . EYE FLOATERS 01/15/2010  . ONYCHIA AND PARONYCHIA OF FINGER 11/02/2008  . 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 Testosterone deficiency 11/16/2007  . HYPERTENSION, CONTROLLED 11/16/2007  . SLEEP APNEA 11/16/2007  . 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 Hyperlipidemia 05/28/2003    Past Surgical History  Procedure Laterality Date  . Tonsillectomy and adenoidectomy      as a child  . Testicle surgery      as a child; "they didn't descend"  . Hemicolectomy  10/2010    for diverticulosis, complic by leaking anastamosis/ abcess/ addn surg and iliostomy   . Carotid dopplers  8/12    normal - after cva   . Abdominal exploration surgery  05/07/2014    w/LO;&  Repair multiple, incarcerated incisional hernias with mesh  . Hernia repair  05/07/2014  . Colon surgery    . Ileostomy  10/2010  . Ileostomy closure  06/2011  . Vasectomy    . Cardiac catheterization    . Coronary angioplasty with stent placement  07/2012    to the distal RCA  . Extracorporeal shock wave lithotripsy  2014  . Incisional hernia repair N/A 05/07/2014    Procedure: REPAIR RECURRENT INCISIONAL HERNIA;  Surgeon: Fanny Skates, MD;  Location: [place];  Service: General;  Laterality: N/A;  . Insertion of mesh N/A 05/07/2014    Procedure: INSERTION OF MESH;  Surgeon: Fanny Skates, MD;  Location: [place];  Service: General;  Laterality: N/A;    Current Outpatient Rx  Name  Route  Sig  Dispense  Refill  . Ascorbic Acid (VITAMIN C) 1000 MG tablet   Oral   Take 1,000 mg by mouth daily.         Marland Kitchen aspirin EC 81 MG tablet   Oral   Take 1 tablet (81 mg total) by mouth daily.   90 tablet   3   . atorvastatin (LIPITOR) 80 MG  tablet   Oral   Take 1 tablet (80 mg total) by mouth daily at 6 PM.   90 tablet   3   . lisinopril (PRINIVIL,ZESTRIL) 10 MG tablet      TAKE ONE TABLET BY MOUTH ONCE DAILY   90 tablet   3   . metoprolol tartrate (LOPRESSOR) 25 MG tablet   Oral   Take 1 tablet (25 mg total) by mouth 2 (two) times daily.   180 tablet   3   . mometasone (NASONEX) 50 MCG/ACT nasal spray   Nasal   Place 2 sprays into the nose daily.   17 g   12   . naproxen (NAPROSYN) 250 MG tablet   Oral   Take 1 tablet (250 mg total) by mouth 2 (two) times daily with a meal.   40 tablet   0   . ondansetron (ZOFRAN ODT) 8 MG disintegrating tablet   Oral   Take 1 tablet (8 mg total) by mouth every 8 (eight) hours as needed for nausea or vomiting.   20 tablet   0   . oxyCODONE 10 MG TABS   Oral   Take 1 tablet (10 mg total) by mouth every 3 (three) hours as needed for severe pain.   30 tablet   0   . oxyCODONE-acetaminophen (ROXICET) 5-325 MG per tablet   Oral   Take 1 tablet by mouth every 6 (six) hours as needed for severe pain.   12 tablet   0   . phenazopyridine (PYRIDIUM) 200 MG tablet   Oral   Take 1 tablet (200 mg total) by mouth 3 (three) times daily as needed for pain.   10 tablet   0   . pyridoxine (B-6) 100 MG tablet   Oral   Take 100 mg by mouth daily.         . tadalafil (CIALIS) 5 MG tablet   Oral   Take 1 tablet (5 mg total) by mouth daily as needed for erectile dysfunction.   30 tablet   1   . ticagrelor (BRILINTA) 60 MG TABS tablet   Oral   Take 1 tablet (60 mg total) by mouth 2 (two) times daily.   60 tablet   11   . vitamin B-12 (CYANOCOBALAMIN) 500 MCG tablet   Oral   Take 500 mcg by mouth daily.           Allergies Dilaudid; Sildenafil citrate; and Testosterone  Family History  Problem Relation Age of Onset  . Heart disease Mother 74    MI  . Hypertension Mother   . Alcohol abuse Mother   . Heart disease Father   . Hypertension Father   . Cancer  Sister     colon    Social  History History  Substance Use Topics  . Smoking status: Former Smoker -- 1.00 packs/day for 2 years    Types: Cigarettes    Quit date: 04/20/1978  . Smokeless tobacco: Never Used  . Alcohol Use: 0.0 oz/week    0 Standard drinks or equivalent per week     Comment: 05/07/2014 "maybe a beer twice/month"    Review of Systems  Constitutional: No fever or chills. No weight changes Eyes:No blurry vision or double vision.  ENT: No sore throat. Cardiovascular: No chest pain. Respiratory: No dyspnea or cough. Gastrointestinal: Suprapubic pain and right flank pain as above.  No BRBPR or melena. Genitourinary: Positive dysuria without retention hesitancy or hematuria.. Musculoskeletal: Negative for back pain. No joint swelling or pain. Skin: Negative for rash. Neurological: Negative for headaches, focal weakness or numbness. Psychiatric:No anxiety or depression.   Endocrine:No hot/cold intolerance, changes in energy, or sleep difficulty.  10-point ROS otherwise negative.  ____________________________________________   PHYSICAL EXAM:  VITAL SIGNS: ED Triage Vitals  Enc Vitals Group     BP 11/22/14 1914 140/88 mmHg     Pulse Rate 11/22/14 1914 69     Resp 11/22/14 1914 18     Temp 11/22/14 1914 97.7 F (36.5 C)     Temp Source 11/22/14 1914 Oral     SpO2 11/22/14 1914 97 %     Weight 11/22/14 1914 245 lb (111.131 kg)     Height 11/22/14 1914 5\' 9"  (1.753 m)     Head Cir --      Peak Flow --      Pain Score 11/22/14 1914 5     Pain Loc --      Pain Edu? --      Excl. in [place]? --      Constitutional: Alert and oriented. Well appearing and in no distress. Eyes: No scleral icterus. No conjunctival pallor. PERRL. EOMI ENT   Head: Normocephalic and atraumatic.   Nose: No congestion/rhinnorhea. No septal hematoma   Mouth/Throat: MMM, no pharyngeal erythema. No peritonsillar mass. No uvula shift.   Neck: No stridor. No SubQ emphysema.  No meningismus. Hematological/Lymphatic/Immunilogical: No cervical lymphadenopathy. Cardiovascular: RRR. Normal and symmetric distal pulses are present in all extremities. No murmurs, rubs, or gallops. Respiratory: Normal respiratory effort without tachypnea nor retractions. Breath sounds are clear and equal bilaterally. No wheezes/rales/rhonchi. Gastrointestinal: Soft and nontender. No distention. There is no CVA tenderness.  No rebound, rigidity, or guarding. Genitourinary: deferred Musculoskeletal: Nontender with normal range of motion in all extremities. No joint effusions.  No lower extremity tenderness.  No edema. Neurologic:   Normal speech and language.  CN 2-10 normal. Motor grossly intact. No pronator drift.  Normal gait. No gross focal neurologic deficits are appreciated.  Skin:  Skin is warm, dry and intact. No rash noted.  No petechiae, purpura, or bullae. Psychiatric: Mood and affect are normal. Speech and behavior are normal. Patient exhibits appropriate insight and judgment.  ____________________________________________    LABS (pertinent positives/negatives) (all labs ordered are listed, but only abnormal results are displayed) Labs Reviewed  URINALYSIS COMPLETEWITH MICROSCOPIC ([place] ONLY) - Abnormal; Notable for the following:    Color, Urine YELLOW (*)    APPearance CLEAR (*)    Hgb urine dipstick 2+ (*)    Protein, ur 100 (*)    Squamous Epithelial / LPF 0-5 (*)    All other components within normal limits   ____________________________________________   EKG    ____________________________________________    RADIOLOGY  CT  abdomen and pelvis stone protocol reveals 5 mm stone in the bladder. No hydroureteronephrosis. No evidence of pyelonephritis.  ____________________________________________   PROCEDURES  ____________________________________________   INITIAL IMPRESSION / ASSESSMENT AND PLAN / ED COURSE  Pertinent labs & imaging results that  were available during my care of the patient were reviewed by me and considered in my medical decision making (see chart for details).  Patient presents with right renal colic. CT unremarkable, urinalysis and other labs unremarkable. Patient will be discharged home with Percocet and said Zofran and Pyridium. His predominant symptom is dysuria, which may be due to a urethral abrasion from a previously passed stone recently. Low suspicion for chlamydia and gonorrhea.  ____________________________________________   FINAL CLINICAL IMPRESSION(S) / ED DIAGNOSES  Final diagnoses:  Renal colic      Carrie Mew, MD 11/22/14 2105

## 2014-11-22 NOTE — ED Notes (Signed)
Patient presents with c/o RIGHT lower back pain with (+) radiation into flank and suprapubic area. Pain started earlier today. (+) dysuria without appreciable gross hematuria. PMH significant for nephrolithiasis.

## 2015-01-21 ENCOUNTER — Other Ambulatory Visit
Admission: RE | Admit: 2015-01-21 | Discharge: 2015-01-21 | Disposition: A | Discharge status: Home / Self Care | Payer: BLUE CROSS/BLUE SHIELD | Source: Ambulatory Visit | Attending: Urology | Admitting: Urology

## 2015-01-21 DIAGNOSIS — Z9289 Personal history of other medical treatment: Secondary | ICD-10-CM | POA: Diagnosis not present

## 2015-01-21 LAB — BASIC METABOLIC PANEL
ANION GAP: 6 (ref 5–15)
BUN: 17 mg/dL (ref 6–20)
CHLORIDE: 108 mmol/L (ref 101–111)
CO2: 27 mmol/L (ref 22–32)
Calcium: 9.1 mg/dL (ref 8.9–10.3)
Creatinine, Ser: 0.98 mg/dL (ref 0.61–1.24)
GFR calc Af Amer: >60 mL/min (ref >60)
GFR calc non Af Amer: >60 mL/min (ref >60)
GLUCOSE: 127 mg/dL — AB (ref 65–99)
POTASSIUM: 4.1 mmol/L (ref 3.5–5.1)
SODIUM: 141 mmol/L (ref 135–145)

## 2015-03-18 ENCOUNTER — Telehealth: Payer: Self-pay | Admitting: Cardiovascular Disease

## 2015-03-18 NOTE — Telephone Encounter (Signed)
3 attempts to schedule from recall list.  LMOV to call office.  Deleting recall.   °

## 2015-04-02 ENCOUNTER — Other Ambulatory Visit: Payer: Self-pay | Admitting: Cardiovascular Disease

## 2015-04-02 NOTE — Telephone Encounter (Signed)
Rx(s) sent to pharmacy electronically.  

## 2015-04-08 ENCOUNTER — Telehealth: Payer: Self-pay

## 2015-04-08 MED ORDER — TRAMADOL HCL 50 MG PO TABS: 50.0000 mg | ORAL_TABLET | Freq: Three times a day (TID) | ORAL | Status: DC | PRN

## 2015-04-08 NOTE — Telephone Encounter (Signed)
Jacob Marks pts wife (DPR signed) left v/m requesting percocet for kidney stones. Started with pain last night; urologist is in [facility]. Cindy left [phone] for contacting pt; no answer and v/m not set up. Left v/m for Cindy to cb. Does not want to go to ED.Please advise.

## 2015-04-08 NOTE — Telephone Encounter (Signed)
Patient advised.   Patient would like Tramadol phoned in to [place], [facility] in [place].  Patient says he doesn't need an appointment.

## 2015-04-08 NOTE — Telephone Encounter (Signed)
We are closed and it is too late to px that (since it needs to be written)-also need to do a ua and see him  I could call in tramadol for the night  and have him f/u tomorrow if needed - please check with him and let me know  Will send to [place] since it is after 5  Do enc fluids  ED if pain becomes more severe or n/v/fever or other symptoms

## 2015-04-08 NOTE — Telephone Encounter (Signed)
Medication phoned to pharmacy. Patient advised.  

## 2015-04-08 NOTE — Telephone Encounter (Signed)
Please call in Tramadol  F/u if no improvement (or f/u urology)

## 2015-04-25 ENCOUNTER — Ambulatory Visit (INDEPENDENT_AMBULATORY_CARE_PROVIDER_SITE_OTHER): Payer: BC Managed Care – PPO | Admitting: Primary Care

## 2015-04-25 ENCOUNTER — Encounter: Payer: Self-pay | Admitting: Primary Care

## 2015-04-25 ENCOUNTER — Ambulatory Visit: Payer: BC Managed Care – PPO | Admitting: Physician Assistant

## 2015-04-25 VITALS — BP 126/72 | HR 64 | Temp 97.7°F | Ht 69.0 in | Wt 263.8 lb

## 2015-04-25 DIAGNOSIS — R05 Cough: Secondary | ICD-10-CM | POA: Diagnosis not present

## 2015-04-25 DIAGNOSIS — R059 Cough, unspecified: Secondary | ICD-10-CM

## 2015-04-25 MED ORDER — AZITHROMYCIN 250 MG PO TABS: ORAL_TABLET | ORAL | Status: DC

## 2015-04-25 MED ORDER — BENZONATATE 200 MG PO CAPS: 200.0000 mg | ORAL_CAPSULE | Freq: Three times a day (TID) | ORAL | Status: DC | PRN

## 2015-04-25 NOTE — Patient Instructions (Signed)
You may take Benzonatate capsules for cough. Take 1 capsule by mouth three times daily as needed for cough.  Start Azithromycin antibiotics. Take 2 tablets by mouth today, then 1 tablet daily for 4 additional days.  Increase consumption of fluids and rest.  Your cough may last up to 2 weeks after treatment.  It was a pleasure meeting you!  Acute Bronchitis Bronchitis is inflammation of the airways that extend from the windpipe into the lungs (bronchi). The inflammation often causes mucus to develop. This leads to a cough, which is the most common symptom of bronchitis.  In acute bronchitis, the condition usually develops suddenly and goes away over time, usually in a couple weeks. Smoking, allergies, and asthma can make bronchitis worse. Repeated episodes of bronchitis may cause further lung problems.  CAUSES Acute bronchitis is most often caused by the same virus that causes a cold. The virus can spread from person to person (contagious) through coughing, sneezing, and touching contaminated objects. SIGNS AND SYMPTOMS   Cough.   Fever.   Coughing up mucus.   Body aches.   Chest congestion.   Chills.   Shortness of breath.   Sore throat.  DIAGNOSIS  Acute bronchitis is usually diagnosed through a physical exam. Your health care provider will also ask you questions about your medical history. Tests, such as chest X-rays, are sometimes done to rule out other conditions.  TREATMENT  Acute bronchitis usually goes away in a couple weeks. Oftentimes, no medical treatment is necessary. Medicines are sometimes given for relief of fever or cough. Antibiotic medicines are usually not needed but may be prescribed in certain situations. In some cases, an inhaler may be recommended to help reduce shortness of breath and control the cough. A cool mist vaporizer may also be used to help thin bronchial secretions and make it easier to clear the chest.  HOME CARE INSTRUCTIONS  Get plenty  of rest.   Drink enough fluids to keep your urine clear or pale yellow (unless you have a medical condition that requires fluid restriction). Increasing fluids may help thin your respiratory secretions (sputum) and reduce chest congestion, and it will prevent dehydration.   Take medicines only as directed by your health care provider.  If you were prescribed an antibiotic medicine, finish it all even if you start to feel better.  Avoid smoking and secondhand smoke. Exposure to cigarette smoke or irritating chemicals will make bronchitis worse. If you are a smoker, consider using nicotine gum or skin patches to help control withdrawal symptoms. Quitting smoking will help your lungs heal faster.   Reduce the chances of another bout of acute bronchitis by washing your hands frequently, avoiding people with cold symptoms, and trying not to touch your hands to your mouth, nose, or eyes.   Keep all follow-up visits as directed by your health care provider.  SEEK MEDICAL CARE IF: Your symptoms do not improve after 1 week of treatment.  SEEK IMMEDIATE MEDICAL CARE IF:  You develop an increased fever or chills.   You have chest pain.   You have severe shortness of breath.  You have bloody sputum.   You develop dehydration.  You faint or repeatedly feel like you are going to pass out.  You develop repeated vomiting.  You develop a severe headache. MAKE SURE YOU:   Understand these instructions.  Will watch your condition.  Will get help right away if you are not doing well or get worse.   This information is  not intended to replace advice given to you by your health care provider. Make sure you discuss any questions you have with your health care provider.   Document Released: 05/14/2004 Document Revised: 04/27/2014 Document Reviewed: 09/27/2012 Elsevier Interactive Patient Education [employer].

## 2015-04-25 NOTE — Progress Notes (Signed)
Pre visit review using our clinic review tool, if applicable. No additional management support is needed unless otherwise documented below in the visit note. 

## 2015-04-25 NOTE — Progress Notes (Signed)
Subjective:    Patient ID: Jacob Marks, male    DOB: [birth year], 55 y.o.   MRN: [id number]  HPI  Jacob Marks is a 55 year old male who presents today with a chief complaint of cough. He also reports nasal congestion, headache, ear pain. His cough is productive with clear sputum. His symptoms have been present for 10 days. Overall his symptoms were slightly improved, but then became worse yesterday. Denies fevers, chills. He's been taking cold and sinus medication with some improvement and mucinex without much improvement. His most bothersome symptom is his cough and fatigue.  Review of Systems  Constitutional: Negative for fever and chills.  HENT: Positive for congestion, ear pain, postnasal drip and sinus pressure. Negative for sore throat.   Respiratory: Positive for cough. Negative for shortness of breath.   Cardiovascular: Negative for chest pain.  Gastrointestinal: Negative for nausea.       Past Medical History  Diagnosis Date  . Hypertension   . Hyperlipidemia   . Testosterone deficiency   . ED (erectile dysfunction)   . Pes planus   . Heart murmur     as a child  . Diverticulosis 7/12    with hemicolectomy-- complications   . CVA (cerebral infarction) 10/2010    after his hemicolectomy; "some paralysis right side of mouth and slight speech problem as a result" (05/07/2014)  . Stroke ([place])   . Coronary artery disease   . History of kidney cancer     lesion on the right kidney  . STEMI (ST elevation myocardial infarction) ([place]) 07/2012  . Childhood asthma   . OSA (obstructive sleep apnea)     "has CPAP; doesn't wear it" (05/07/2014)  . Chronic kidney disease     renal cyst right  . Kidney stones     Social History   Social History  . Marital Status: Married    Spouse Name: N/A  . Number of Children: N/A  . Years of Education: N/A   Occupational History  . Not on file.   Social History Main Topics  . Smoking status: Former Smoker -- 1.00 packs/day for 2 years      Types: Cigarettes    Quit date: 04/20/1978  . Smokeless tobacco: Never Used  . Alcohol Use: 0.0 oz/week    0 Standard drinks or equivalent per week     Comment: 05/07/2014 "maybe a beer twice/month"  . Drug Use: No  . Sexual Activity: Yes   Other Topics Concern  . Not on file   Social History Narrative    Past Surgical History  Procedure Laterality Date  . Tonsillectomy and adenoidectomy      as a child  . Testicle surgery      as a child; "they didn't descend"  . Hemicolectomy  10/2010    for diverticulosis, complic by leaking anastamosis/ abcess/ addn surg and iliostomy   . Carotid dopplers  8/12    normal - after cva   . Abdominal exploration surgery  05/07/2014    w/LO;&  Repair multiple, incarcerated incisional hernias with mesh  . Hernia repair  05/07/2014  . Colon surgery    . Ileostomy  10/2010  . Ileostomy closure  06/2011  . Vasectomy    . Cardiac catheterization    . Coronary angioplasty with stent placement  07/2012    to the distal RCA  . Extracorporeal shock wave lithotripsy  2014  . Incisional hernia repair N/A 05/07/2014    Procedure: REPAIR  RECURRENT INCISIONAL HERNIA;  Surgeon: Fanny Skates, MD;  Location: [place];  Service: General;  Laterality: N/A;  . Insertion of mesh N/A 05/07/2014    Procedure: INSERTION OF MESH;  Surgeon: Fanny Skates, MD;  [facility];  Service: General;  Laterality: N/A;    Family History  Problem Relation Age of Onset  . Heart disease Mother 78    MI  . Hypertension Mother   . Alcohol abuse Mother   . Heart disease Father   . Hypertension Father   . Cancer Sister     colon    Allergies  Allergen Reactions  . Dilaudid [Hydromorphone Hcl] Other (See Comments)    Passed out  . Sildenafil Citrate   . Testosterone Rash    Rash from the patch    Current Outpatient Prescriptions on File Prior to Visit  Medication Sig Dispense Refill  . aspirin EC 81 MG tablet Take 1 tablet (81 mg total) by mouth daily. 90 tablet 3   . atorvastatin (LIPITOR) 80 MG tablet Take 1 tablet (80 mg total) by mouth daily at 6 PM. (Patient taking differently: Take 40 mg by mouth daily at 6 PM. ) 90 tablet 3  . lisinopril (PRINIVIL,ZESTRIL) 10 MG tablet TAKE ONE TABLET BY MOUTH ONCE DAILY 90 tablet 3  . metoprolol tartrate (LOPRESSOR) 25 MG tablet Take 1 tablet (25 mg total) by mouth 2 (two) times daily. PLEASE KEEP UPCOMING APPOINTMENT 180 tablet 0  . Ascorbic Acid (VITAMIN C) 1000 MG tablet Take 1,000 mg by mouth daily. Reported on 04/25/2015    . mometasone (NASONEX) 50 MCG/ACT nasal spray Place 2 sprays into the nose daily. (Patient not taking: Reported on 04/25/2015) 17 g 12  . naproxen (NAPROSYN) 250 MG tablet Take 1 tablet (250 mg total) by mouth 2 (two) times daily with a meal. (Patient not taking: Reported on 04/25/2015) 40 tablet 0  . ondansetron (ZOFRAN ODT) 8 MG disintegrating tablet Take 1 tablet (8 mg total) by mouth every 8 (eight) hours as needed for nausea or vomiting. (Patient not taking: Reported on 04/25/2015) 20 tablet 0  . oxyCODONE 10 MG TABS Take 1 tablet (10 mg total) by mouth every 3 (three) hours as needed for severe pain. (Patient not taking: Reported on 04/25/2015) 30 tablet 0  . oxyCODONE-acetaminophen (ROXICET) 5-325 MG per tablet Take 1 tablet by mouth every 6 (six) hours as needed for severe pain. (Patient not taking: Reported on 04/25/2015) 12 tablet 0  . phenazopyridine (PYRIDIUM) 200 MG tablet Take 1 tablet (200 mg total) by mouth 3 (three) times daily as needed for pain. (Patient not taking: Reported on 04/25/2015) 10 tablet 0  . pyridoxine (B-6) 100 MG tablet Take 100 mg by mouth daily. Reported on 04/25/2015    . tadalafil (CIALIS) 5 MG tablet Take 1 tablet (5 mg total) by mouth daily as needed for erectile dysfunction. (Patient not taking: Reported on 04/25/2015) 30 tablet 1  . ticagrelor (BRILINTA) 60 MG TABS tablet Take 1 tablet (60 mg total) by mouth 2 (two) times daily. (Patient not taking: Reported on 04/25/2015) 60  tablet 11  . traMADol (ULTRAM) 50 MG tablet Take 1 tablet (50 mg total) by mouth every 8 (eight) hours as needed. (Patient not taking: Reported on 04/25/2015) 9 tablet 0  . vitamin B-12 (CYANOCOBALAMIN) 500 MCG tablet Take 500 mcg by mouth daily. Reported on 04/25/2015     No current facility-administered medications on file prior to visit.    BP 126/72 mmHg  Pulse 64  Temp(Src) 97.7 F (36.5 C) (Oral)  Ht 5\' 9"  (1.753 m)  Wt 263 lb 12.8 oz (119.659 kg)  BMI 38.94 kg/m2  SpO2 97%    Objective:   Physical Exam  Constitutional: He appears well-nourished.  HENT:  Right Ear: Tympanic membrane and ear canal normal.  Left Ear: Tympanic membrane and ear canal normal.  Nose: Right sinus exhibits no maxillary sinus tenderness and no frontal sinus tenderness. Left sinus exhibits no maxillary sinus tenderness and no frontal sinus tenderness.  Mouth/Throat: Oropharynx is clear and moist.  Eyes: Conjunctivae are normal.  Neck: Neck supple.  Cardiovascular: Normal rate and regular rhythm.   Pulmonary/Chest: Effort normal. He has rales.  Lymphadenopathy:    He has no cervical adenopathy.  Skin: Skin is warm and dry.          Assessment & Plan:  URI:  Cough and congestion x 10 days, feeling worse yesterday with fatigue. Exam with rales to upper lobes, unable to hear much to lower lobes. Question viral vs bacterial involvement. Since symptoms worse with rales upon examination, will treat with Zpak and tessalon pearls. Fluids, rest, follow up PRN.

## 2015-04-30 ENCOUNTER — Encounter: Payer: Self-pay | Admitting: Cardiovascular Disease

## 2015-04-30 ENCOUNTER — Ambulatory Visit (INDEPENDENT_AMBULATORY_CARE_PROVIDER_SITE_OTHER): Payer: BC Managed Care – PPO | Admitting: Cardiovascular Disease

## 2015-04-30 VITALS — BP 122/80 | HR 62 | Ht 69.0 in | Wt 266.0 lb

## 2015-04-30 DIAGNOSIS — I10 Essential (primary) hypertension: Secondary | ICD-10-CM

## 2015-04-30 DIAGNOSIS — E785 Hyperlipidemia, unspecified: Secondary | ICD-10-CM | POA: Diagnosis not present

## 2015-04-30 DIAGNOSIS — I2510 Atherosclerotic heart disease of native coronary artery without angina pectoris: Secondary | ICD-10-CM

## 2015-04-30 DIAGNOSIS — I213 ST elevation (STEMI) myocardial infarction of unspecified site: Secondary | ICD-10-CM | POA: Diagnosis not present

## 2015-04-30 MED ORDER — NITROGLYCERIN 0.4 MG SL SUBL: 0.4000 mg | SUBLINGUAL_TABLET | SUBLINGUAL | Status: AC | PRN

## 2015-04-30 NOTE — Progress Notes (Signed)
Patient ID: Jacob Marks, male    DOB: April 01, 1961, 55 y.o.   MRN: [id number]  HPI Comments: 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 He had significant thrombus. DES was placed to the distal RCA, aspiration thrombectomy with large thrombus revealed. Started on aspirin and brilinta. No other significant disease noted. He presents today for routine follow-up of his coronary artery disease  In follow-up today, she denies any symptoms concerning for angina Taking aspirin, Lipitor, lisinopril, metoprolol Weight continues to be a problem. No regular exercise program No recent lab work available for review Hemoglobin A1c 5.5 last year  EKG on today's visit shows normal sinus rhythm with rate 62 bpm, no significant ST or T-wave changes  Past medical history Echocardiogram in the hospital was essentially normal with normal ejection fraction estimated at greater than 55%, normal right ventricular systolic pressure, no significant wall motion abnormality.    Allergies  Allergen Reactions  . Dilaudid [Hydromorphone Hcl] Other (See Comments)    Passed out  . Sildenafil Citrate   . Testosterone Rash    Rash from the patch    Current Outpatient Prescriptions on File Prior to Visit  Medication Sig Dispense Refill  . lisinopril (PRINIVIL,ZESTRIL) 10 MG tablet TAKE ONE TABLET BY MOUTH ONCE DAILY 90 tablet 3  . metoprolol tartrate (LOPRESSOR) 25 MG tablet Take 1 tablet (25 mg total) by mouth 2 (two) times daily. PLEASE KEEP UPCOMING APPOINTMENT 180 tablet 0  . traMADol (ULTRAM) 50 MG tablet Take 1 tablet (50 mg total) by mouth every 8 (eight) hours as needed. 9 tablet 0   No current facility-administered medications on file prior to visit.    Past Medical History  Diagnosis Date  . Hypertension   . Hyperlipidemia   . Testosterone deficiency   . ED (erectile dysfunction)    . Pes planus   . Heart murmur     as a child  . Diverticulosis 7/12    with hemicolectomy-- complications   . CVA (cerebral infarction) 10/2010    after his hemicolectomy; "some paralysis right side of mouth and slight speech problem as a result" (05/07/2014)  . Stroke ([place])   . Coronary artery disease   . History of kidney cancer     lesion on the right kidney  . STEMI (ST elevation myocardial infarction) ([place]) 07/2012  . Childhood asthma   . OSA (obstructive sleep apnea)     "has CPAP; doesn't wear it" (05/07/2014)  . Chronic kidney disease     renal cyst right  . Kidney stones     Past Surgical History  Procedure Laterality Date  . Tonsillectomy and adenoidectomy      as a child  . Testicle surgery      as a child; "they didn't descend"  . Hemicolectomy  10/2010    for diverticulosis, complic by leaking anastamosis/ abcess/ addn surg and iliostomy   . Carotid dopplers  8/12    normal - after cva   . Abdominal exploration surgery  05/07/2014    w/LO;&  Repair multiple, incarcerated incisional hernias with mesh  . Hernia repair  05/07/2014  . Colon surgery    . Ileostomy  10/2010  . Ileostomy closure  06/2011  . Vasectomy    . Cardiac catheterization    . Coronary angioplasty with stent placement  07/2012    to the distal RCA  .  Extracorporeal shock wave lithotripsy  2014  . Incisional hernia repair N/A 05/07/2014    Procedure: REPAIR RECURRENT INCISIONAL HERNIA;  Surgeon: Fanny Skates, MD;  Location: [facility];  Service: General;  Laterality: N/A;  . Insertion of mesh N/A 05/07/2014    Procedure: INSERTION OF MESH;  Surgeon: Fanny Skates, MD;  Location: [facility];  Service: General;  Laterality: N/A;    Social History  reports that he quit smoking about 37 years ago. His smoking use included Cigarettes. He has a 2 pack-year smoking history. He has never used smokeless tobacco. He reports that he drinks alcohol. He reports that he does not use illicit drugs.  Family  History family history includes Alcohol abuse in his mother; Cancer in his sister; Heart disease in his father; Heart disease (age of onset: 28) in his mother; Hypertension in his father and mother.   Review of Systems  Constitutional: Negative.   Respiratory: Negative.   Cardiovascular: Negative.   Gastrointestinal: Negative.   Endocrine: Negative.   Genitourinary:       Erectile dysfunction  Musculoskeletal: Negative.   Skin: Negative.   Neurological: Negative.   All other systems reviewed and are negative.   BP 122/80 mmHg  Pulse 62  Ht 5\' 9"  (1.753 m)  Wt 266 lb (120.657 kg)  BMI 39.26 kg/m2  Physical Exam  Constitutional: He is oriented to person, place, and time. He appears well-developed and well-nourished.  obese  HENT:  Head: Normocephalic.  Nose: Nose normal.  Mouth/Throat: Oropharynx is clear and moist.  Eyes: Conjunctivae are normal. Pupils are equal, round, and reactive to light.  Neck: Normal range of motion. Neck supple. No JVD present.  Cardiovascular: Normal rate, regular rhythm, S1 normal, S2 normal, normal heart sounds and intact distal pulses.  Exam reveals no gallop and no friction rub.   No murmur heard. Pulmonary/Chest: Effort normal and breath sounds normal. No respiratory distress. He has no wheezes. He has no rales. He exhibits no tenderness.  Abdominal: Soft. Bowel sounds are normal. He exhibits no distension. There is no tenderness.  Musculoskeletal: Normal range of motion. He exhibits no edema or tenderness.  Lymphadenopathy:    He has no cervical adenopathy.  Neurological: He is alert and oriented to person, place, and time. Coordination normal.  Skin: Skin is warm and dry. No rash noted. No erythema.  Psychiatric: He has a normal mood and affect. His behavior is normal. Judgment and thought content normal.      Assessment and Plan   Nursing note and vitals reviewed.

## 2015-04-30 NOTE — Assessment & Plan Note (Signed)
Stable from a cardiac perspective, no symptoms of angina, will continue current medications,

## 2015-04-30 NOTE — Assessment & Plan Note (Signed)
We have encouraged continued exercise, careful diet management in an effort to lose weight. 

## 2015-04-30 NOTE — Assessment & Plan Note (Signed)
Currently with no symptoms of angina. No further workup at this time. Continue current medication regimen. Prior stent to the RCA, on aspirin

## 2015-04-30 NOTE — Patient Instructions (Addendum)
You are doing well. No medication changes were made.  We will check lab work today, fasting Liver,lipid  Please call us if you have new issues that need to be addressed before your next appt.  Your physician wants you to follow-up in: 12 months.  You will receive a reminder letter in the mail two months in advance. If you don't receive a letter, please call our office to schedule the follow-up appointment.

## 2015-04-30 NOTE — Assessment & Plan Note (Signed)
Blood pressure is well controlled on today's visit. No changes made to the medications. 

## 2015-04-30 NOTE — Assessment & Plan Note (Signed)
We have ordered lipid panel today Goal LDL less than 70

## 2015-05-01 LAB — HEPATIC FUNCTION PANEL
ALBUMIN: 4.3 g/dL (ref 3.5–5.5)
ALK PHOS: 102 IU/L (ref 39–117)
ALT: 23 IU/L (ref 0–44)
AST: 24 IU/L (ref 0–40)
BILIRUBIN, DIRECT: 0.18 mg/dL (ref 0.00–0.40)
Bilirubin Total: 0.6 mg/dL (ref 0.0–1.2)
TOTAL PROTEIN: 6.7 g/dL (ref 6.0–8.5)

## 2015-05-01 LAB — LIPID PANEL
CHOL/HDL RATIO: 2.5 ratio (ref 0.0–5.0)
Cholesterol, Total: 125 mg/dL (ref 100–199)
HDL: 51 mg/dL (ref >39)
LDL CALC: 52 mg/dL (ref 0–99)
TRIGLYCERIDES: 110 mg/dL (ref 0–149)
VLDL Cholesterol Cal: 22 mg/dL (ref 5–40)

## 2015-07-25 ENCOUNTER — Other Ambulatory Visit: Payer: Self-pay | Admitting: Cardiovascular Disease

## 2015-08-29 ENCOUNTER — Ambulatory Visit (INDEPENDENT_AMBULATORY_CARE_PROVIDER_SITE_OTHER): Payer: BC Managed Care – PPO | Admitting: Family Medicine

## 2015-08-29 ENCOUNTER — Encounter: Payer: Self-pay | Admitting: Family Medicine

## 2015-08-29 VITALS — BP 124/60 | HR 69 | Temp 98.6°F | Ht 69.0 in | Wt 259.5 lb

## 2015-08-29 DIAGNOSIS — D1739 Benign lipomatous neoplasm of skin and subcutaneous tissue of other sites: Secondary | ICD-10-CM

## 2015-08-29 DIAGNOSIS — Z8719 Personal history of other diseases of the digestive system: Secondary | ICD-10-CM

## 2015-08-29 DIAGNOSIS — K5909 Other constipation: Secondary | ICD-10-CM | POA: Diagnosis not present

## 2015-08-29 DIAGNOSIS — Z9889 Other specified postprocedural states: Secondary | ICD-10-CM | POA: Diagnosis not present

## 2015-08-29 DIAGNOSIS — D1720 Benign lipomatous neoplasm of skin and subcutaneous tissue of unspecified limb: Secondary | ICD-10-CM

## 2015-08-29 NOTE — Progress Notes (Signed)
Dr. Frederico Hamman T. Gayanne Prescott, MD, [facility] [place], [address] Phone: [phone] Fax: [phone]  08/29/2015  Patient: Jacob Marks, MRN: [id number], DOB: Apr 20, 1961, 55 y.o.  Primary Physician:  Loura Pardon, MD   Chief Complaint  Patient presents with  . Follow-up    Hernia Surgery-04/2014-Twisted the other day-feels pull  . Mass    Left Thigh   Subjective:   Jacob Marks is a 55 y.o. very pleasant male patient who presents with the following:  Has had a lot of hernias, eight different hernias. For about a week has felt about a spsm ofr some movement.  He seems fairly anxious, and he is concerned that he has had a recurrence of his hernia, and he had a massive hernia repair a little bit over a year ago with complications.  Also has a knot on the inside of his leg. ppreviously this was imaged multiple times, and found to be consistent with lipoma.  He is worried about this.  He also is having some constipation.  Past Medical History, Surgical History, Social History, Family History, Problem List, Medications, and Allergies have been reviewed and updated if relevant.  Patient Active Problem List   Diagnosis Date Noted  . Morbid obesity ([place]) 04/30/2015  . CAD (coronary artery disease) 05/07/2014  . Incarcerated incisional hernia 04/23/2014  . Erectile dysfunction 02/21/2014  . Allergic rhinitis 09/04/2013  . Vertigo, benign paroxysmal 09/04/2013  . STEMI (ST elevation myocardial infarction) ([place]) 08/12/2012  . Chest pain 03/21/2012  . GERD (gastroesophageal reflux disease) 03/21/2012  . Dyspnea 06/09/2011  . Post-nasal drip 06/09/2011  . Kidney stone 06/09/2011  . Anxiety 06/09/2011  . Dysphagia 03/19/2011  . Diverticulosis 01/04/2011  . Wound infection after surgery 01/04/2011  . CVA (cerebral infarction) 01/02/2011  . Depression 01/02/2011  . Anemia 01/02/2011  . Fatigue 08/05/2010  . Prostate cancer  screening 08/05/2010  . TINEA VERSICOLOR 01/15/2010  . EYE FLOATERS 01/15/2010  . ONYCHIA AND PARONYCHIA OF FINGER 11/02/2008  . 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 Testosterone deficiency 11/16/2007  . HYPERTENSION, CONTROLLED 11/16/2007  . SLEEP APNEA 11/16/2007  . 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 Hyperlipidemia 05/28/2003    Past Medical History  Diagnosis Date  . Hypertension   . Hyperlipidemia   . Testosterone deficiency   . ED (erectile dysfunction)   . Pes planus   . Heart murmur     as a child  . Diverticulosis 7/12    with hemicolectomy-- complications   . CVA (cerebral infarction) 10/2010    after his hemicolectomy; "some paralysis right side of mouth and slight speech problem as a result" (05/07/2014)  . Stroke ([place])   . Coronary artery disease   . History of kidney cancer     lesion on the right kidney  . STEMI (ST elevation myocardial infarction) ([place]) 07/2012  . Childhood asthma   . OSA (obstructive sleep apnea)     "has CPAP; doesn't wear it" (05/07/2014)  . Chronic kidney disease     renal cyst right  . Kidney stones     Past Surgical History  Procedure Laterality Date  . Tonsillectomy and adenoidectomy      as a child  . Testicle surgery      as a child; "they didn't descend"  . Hemicolectomy  10/2010    for diverticulosis, complic by leaking anastamosis/ abcess/ addn surg and iliostomy   .  Carotid dopplers  8/12    normal - after cva   . Abdominal exploration surgery  05/07/2014    w/LO;&  Repair multiple, incarcerated incisional hernias with mesh  . Hernia repair  05/07/2014  . Colon surgery    . Ileostomy  10/2010  . Ileostomy closure  06/2011  . Vasectomy    . Cardiac catheterization    . Coronary angioplasty with stent placement  07/2012    to the distal RCA  . Extracorporeal shock wave lithotripsy  2014  . Incisional hernia repair N/A 05/07/2014    Procedure: REPAIR RECURRENT INCISIONAL HERNIA;  Surgeon: Fanny Skates,  MD;  Location: [facility];  Service: General;  Laterality: N/A;  . Insertion of mesh N/A 05/07/2014    Procedure: INSERTION OF MESH;  Surgeon: Fanny Skates, MD;  Location: [facility];  Service: General;  Laterality: N/A;    Social History   Social History  . Marital Status: Married    Spouse Name: N/A  . Number of Children: N/A  . Years of Education: N/A   Occupational History  . Not on file.   Social History Main Topics  . Smoking status: Former Smoker -- 1.00 packs/day for 2 years    Types: Cigarettes    Quit date: 04/20/1978  . Smokeless tobacco: Never Used  . Alcohol Use: 0.0 oz/week    0 Standard drinks or equivalent per week     Comment: 05/07/2014 "maybe a beer twice/month"  . Drug Use: No  . Sexual Activity: Yes   Other Topics Concern  . Not on file   Social History Narrative    Family History  Problem Relation Age of Onset  . Heart disease Mother 57    MI  . Hypertension Mother   . Alcohol abuse Mother   . Heart disease Father   . Hypertension Father   . Cancer Sister     colon    Allergies  Allergen Reactions  . Dilaudid [Hydromorphone Hcl] Other (See Comments)    Passed out  . Sildenafil Citrate   . Testosterone Rash    Rash from the patch    Medication list reviewed and updated in full in [facility].   GEN: No acute illnesses, no fevers, chills. GI: No n/v/d, eating normally Pulm: No SOB Interactive and getting along well at home.  Otherwise, ROS is as per the HPI.  Objective:   BP 124/60 mmHg  Pulse 69  Temp(Src) 98.6 F (37 C) (Oral)  Ht 5\' 9"  (1.753 m)  Wt 259 lb 8 oz (117.708 kg)  BMI 38.30 kg/m2  GEN: WDWN, NAD, Non-toxic, A & O x 3 HEENT: Atraumatic, Normocephalic. Neck supple. No masses, No LAD. Ears and Nose: No external deformity. CV: RRR, No M/G/R. No JVD. No thrill. No extra heart sounds. PULM: CTA B, no wheezes, crackles, rhonchi. No retractions. No resp. distress. No accessory muscle use. EXTR: No c/c/e NEURO Normal  gait.  PSYCH: Normally interactive. Conversant. Not depressed or anxious appearing.  Calm demeanor.   The patient has a large, soft, freely mobile mass on his upper thigh on the inner thigh.  Most consistent with lipoma.  Abdominal exam shows multiple scars on the abdomen, somewhat hard centrally, notable history of prior large mesh usage.  I cannot discern any appreciable hernia.  Laboratory and Imaging Data: CLINICAL DATA:  Right-sided flank pain for 1.5 hours. History kidney   stones. Nausea. History of right inguinal hernia repair (05/07/2014)     EXAM:  CT ABDOMEN AND PELVIS WITHOUT CONTRAST     TECHNIQUE:   Multidetector CT imaging of the abdomen and pelvis was performed   following the standard protocol without IV contrast.     COMPARISON:  CT abdomen pelvis - 02/26/2014; 05/08/2011; 12/08/2010     FINDINGS:   The lack of intravenous contrast limits the ability to evaluate   solid abdominal organs.     There is a punctate (approximately 0.5 cm) stone within the superior   aspect of the right ureter (image 44, series 2) which results and   mild upstream ureterectasis and pelvicaliectasis. There is a   ill-defined approximately 3 mm linear nonobstructing suspect a stone   within the anterior inferior aspect of the right kidney (image 39,   series 2).     Note is made of a punctate (approximately 3 mm) nonobstructing stone   within the inferior pole of the left kidney (image 40, series 2). No   renal stones are seen along the expected course of the left ureter.   No evidence of left-sided urinary obstruction. A prominent   phlebolith is noted within the right hemipelvis.     Normal noncontrast appearance of the urinary bladder given degree   distention.     An approximately 1.8 x 1.9 cm partially exophytic lesion arising   from the inferior lateral aspect of the right kidney (image 39,   series 2) has minimally increased in size in interval, previously,   1.7 cm when  compared to the 02/26/2014 examination and was noted to   be approximately 0.8 cm on the 05/08/2011 examination.       ---------------------------------------------------------------------     Normal hepatic contour. Previously characterized hepatic cysts are   morphologically similar to the prior examination. Normal appearance   of the gallbladder. No radiopaque gallstones. No ascites.     There is unchanged diffuse thickening of the left adrenal gland   without discrete nodule, similar to the 2012 examination. Normal   noncontrast appearance of the right adrenal gland and pancreas.   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     There is a very minimal amount of perihilar S amount ache dilatation   of a loop of small bowel within the right lower abdominal quadrant.   The bowel is otherwise normal in course and caliber without wall   thickening or evidence of obstruction. Normal noncontrast appearance   of the appendix. No pneumoperitoneum, pneumatosis or portal venous   gas.     Scattered calcified atherosclerotic plaque within a normal caliber   abdominal aorta.     Scattered shotty retroperitoneal lymph nodes are individually not   enlarged by size criteria with index aortocaval lymph node measuring   0.8 cm in greatest short axis diameter (image 48, series 2), and   index right common iliac chain lymph node measuring 0.9 cm (image   61, series 2), similar to the 2012 examination. Shotty mesenteric   lymph nodes with index mesenteric node within the right lower   abdominal quadrant measuring 0.8 cm in greatest short axis diameter   (I 51, series 2 has minimally increased in size previously, 0.5 cm   when compared to the 04/2011 examination. No bulky retroperitoneal,     mesenteric, pelvic or inguinal lymphadenopathy.  Scattered calcifications within a normal size prostate gland. No   free fluid in the pelvic cul-de-sac.     Previously characterized approximately 1.5 cm hypo attenuating cyst   within the superior pole of the right kidney is morphologically   unchanged.     Limited visualization of the lower thorax demonstrates minimal   subsegmental atelectasis within the lingula. No discrete focal   airspace opacities. No pleural effusion.     Normal heart size.  No pericardial effusion.     No acute or aggressive osseous abnormalities.     Post incisional hernia repair with indeterminate approximately 10.0   x 2.6 cm fluid collection within the ventral lower abdominal wall at   the operative site. This finding is associated with a minimal amount   of adjacent subcutaneous stranding. No recurrent hernia formation.     Unchanged at least 9.5 x 6.5 cm lipoma within the left upper thigh   (image 94, series 2), incompletely imaged.     No acute or aggressive osseus abnormalities.     IMPRESSION:   1. Punctate (approximately 5 mm) stone within the superior aspect of   the right ureter results in mild upstream ureterectasis and   pelvicaliectasis.   2. Additional bilateral renal stones as above. No evidence of   left-sided urinary obstruction. Post incisional hernia repair   without evidence of recurrence, however there is an indeterminate   approximately 10 cm fluid collection within the ventral subcutaneous   tissues of the lower abdomen/pelvis about the operative site -   differential considerations for this fluid collection include seroma   (favored) versus hematoma or abscess. Clinical correlation is   advised.   3. Grossly unchanged approximately 9.5 cm lipoma within the proximal   anterior aspect of the left thigh.   4. Continued interval increase in size of the now approximately 1.9   cm partially exophytic lesion arising from the  inferior lateral   aspect of the right kidney (previously, 1.7 cm on the 02/26/2014   examination and approximately 0.8 cm on the 04/2011 examination) -   this lesion remains indeterminate on this noncontrast examination   though worrisome for a slowly growing renal neoplasm. As such,   further evaluation with nonemergent abdominal MRI is recommended.   Urologic referral is recommended. If patient is deemed a non   operative candidate, this patient could be referred to   Interventional Radiology [phone]) for evaluation and consultation   for percutaneous renal cryoablation as indicated.       Electronically Signed     By: Sandi Mariscal M.D.     On: 07/01/2014 14:58     Assessment and Plan:   Lipoma of thigh  Other constipation  History of hernia repair  >25 minutes spent in face to face time with patient, >50% spent in counselling or coordination of care   ooffered reassurance regarding these issues.  Clearly this is a lipoma radiographically as well as clinically.  Reassurance.  No intervention needed.  Cannot feel any hernia on examination.  We also reviewed constipation management and some significant detail.  I also reviewed with the patient and his renal abnormality on multiple scans, and he has had good follow-up, which has been taken [facility] at [facility], who is managing this condition.  Follow-up: No Follow-up on file.  Patient Instructions  GETTING TO [place]. Irregular bowel habits such as constipation and diarrhea can lead to many problems over time.  Having  one soft bowel movement a day is the most important [place] to prevent further problems.  The anorectal canal is designed to handle stretching and feces to safely manage our ability to get rid of solid waste (feces, poop, stool) out of our body.  BUT, hard constipated stools can act like ripping concrete bricks and diarrhea can be a burning fire to this very sensitive area of our body,  causing inflamed hemorrhoids, anal fissures, increasing risk is perirectal abscesses, abdominal pain/bloating, an making irritable bowel worse.     The goal: ONE SOFT BOWEL MOVEMENT A DAY!  To have soft, regular bowel movements:  . Drink at least 8 tall glasses of water a day.   . Take plenty of fiber.  Fiber is the undigested part of plant food that passes into the colon, acting s "natures broom" to encourage bowel motility and movement.  Fiber can absorb and hold large amounts of water. This results in a larger, bulkier stool, which is soft and easier to pass. Work gradually over several weeks up to 6 servings a day of fiber (25g a day even more if needed) in the form of: o Vegetables -- Root (potatoes, carrots, turnips), leafy green (lettuce, salad greens, celery, spinach), or cooked high residue (cabbage, broccoli, etc) o Fruit -- Fresh (unpeeled skin & pulp), Dried (prunes, apricots, cherries, etc ),  or stewed ( applesauce)  o Whole grain breads, pasta, etc (whole wheat)  o Bran cereals  . Bulking Agents -- This type of water-retaining fiber generally is easily obtained each day by one of the following:  o Psyllium bran -- The psyllium plant is remarkable because its ground seeds can retain so much water. This product is available as Metamucil, Konsyl, Effersyllium, Per Diem Fiber, or the less expensive generic preparation in drug and health food stores. Although labeled a laxative, it really is not a laxative.  o Methylcellulose -- This is another fiber derived from wood which also retains water. It is available as Citrucel. o Polyethylene Glycol - and "artificial" fiber commonly called Miralax or Glycolax.  It is helpful for people with gassy or bloated feelings with regular fiber o Flax Seed - a less gassy fiber than psyllium . No reading or other relaxing activity while on the toilet. If bowel movements take longer than 5 minutes, you are too constipated . AVOID CONSTIPATION.  High fiber and  water intake usually takes care of this.  Sometimes a laxative is needed to stimulate more frequent bowel movements, but  . Laxatives are not a good long-term solution as it can wear the colon out. o Osmotics (Milk of Magnesia, Fleets phosphosoda, Magnesium citrate, MiraLax, GoLytely) are safer than  o Stimulants (Senokot, Castor Oil, Dulcolax, Ex Lax)    o Do not take laxatives for more than 7days in a row. .  IF SEVERELY CONSTIPATED, try a Bowel Retraining Program: o Do not use laxatives.  o Eat a diet high in roughage, such as bran cereals and leafy vegetables.  o Drink six (6) ounces of prune or apricot juice each morning.  o Eat two (2) large servings of stewed fruit each day.  o Take one (1) heaping tablespoon of a psyllium-based bulking agent twice a day. Use sugar-free sweetener when possible to avoid excessive calories.  o Eat a normal breakfast.  o Set aside 15 minutes after breakfast to sit on the toilet, but do not strain to have a bowel movement.  o If you do not have a  bowel movement by the third day, use an enema and repeat the above steps.      Signed,  Maud Deed. Clayden Withem, MD   Patient's Medications  New Prescriptions   No medications on file  Previous Medications   ASPIRIN 162 MG EC TABLET    Take 162 mg by mouth daily.   ATORVASTATIN (LIPITOR) 40 MG TABLET    Take 40 mg by mouth daily.   LISINOPRIL (PRINIVIL,ZESTRIL) 10 MG TABLET    TAKE ONE TABLET BY MOUTH ONCE DAILY   METOPROLOL TARTRATE (LOPRESSOR) 25 MG TABLET    TAKE ONE TABLET BY MOUTH TWICE DAILY *PLEASE KEEP UPCOMING APPOINTMENT*   NITROGLYCERIN (NITROSTAT) 0.4 MG SL TABLET    Place 1 tablet (0.4 mg total) under the tongue every 5 (five) minutes as needed for chest pain.  Modified Medications   No medications on file  Discontinued Medications   TRAMADOL (ULTRAM) 50 MG TABLET    Take 1 tablet (50 mg total) by mouth every 8 (eight) hours as needed.

## 2015-08-29 NOTE — Patient Instructions (Signed)
GETTING TO GOOD BOWEL HEALTH. Irregular bowel habits such as constipation and diarrhea can lead to many problems over time.  Having one soft bowel movement a day is the most important [place] to prevent further problems.  The anorectal canal is designed to handle stretching and feces to safely manage our ability to get rid of solid waste (feces, poop, stool) out of our body.  BUT, hard constipated stools can act like ripping concrete bricks and diarrhea can be a burning fire to this very sensitive area of our body, causing inflamed hemorrhoids, anal fissures, increasing risk is perirectal abscesses, abdominal pain/bloating, an making irritable bowel worse.     The goal: ONE SOFT BOWEL MOVEMENT A DAY!  To have soft, regular bowel movements:  . Drink at least 8 tall glasses of water a day.   . Take plenty of fiber.  Fiber is the undigested part of plant food that passes into the colon, acting s "natures broom" to encourage bowel motility and movement.  Fiber can absorb and hold large amounts of water. This results in a larger, bulkier stool, which is soft and easier to pass. Work gradually over several weeks up to 6 servings a day of fiber (25g a day even more if needed) in the form of: o Vegetables -- Root (potatoes, carrots, turnips), leafy green (lettuce, salad greens, celery, spinach), or cooked high residue (cabbage, broccoli, etc) o Fruit -- Fresh (unpeeled skin & pulp), Dried (prunes, apricots, cherries, etc ),  or stewed ( applesauce)  o Whole grain breads, pasta, etc (whole wheat)  o Bran cereals  . Bulking Agents -- This type of water-retaining fiber generally is easily obtained each day by one of the following:  o Psyllium bran -- The psyllium plant is remarkable because its ground seeds can retain so much water. This product is available as Metamucil, Konsyl, Effersyllium, Per Diem Fiber, or the less expensive generic preparation in drug and health food stores. Although labeled a laxative, it really  is not a laxative.  o Methylcellulose -- This is another fiber derived from wood which also retains water. It is available as Citrucel. o Polyethylene Glycol - and "artificial" fiber commonly called Miralax or Glycolax.  It is helpful for people with gassy or bloated feelings with regular fiber o Flax Seed - a less gassy fiber than psyllium . No reading or other relaxing activity while on the toilet. If bowel movements take longer than 5 minutes, you are too constipated . AVOID CONSTIPATION.  High fiber and water intake usually takes care of this.  Sometimes a laxative is needed to stimulate more frequent bowel movements, but  . Laxatives are not a good long-term solution as it can wear the colon out. o Osmotics (Milk of Magnesia, Fleets phosphosoda, Magnesium citrate, MiraLax, GoLytely) are safer than  o Stimulants (Senokot, Castor Oil, Dulcolax, Ex Lax)    o Do not take laxatives for more than 7days in a row. .  IF SEVERELY CONSTIPATED, try a Bowel Retraining Program: o Do not use laxatives.  o Eat a diet high in roughage, such as bran cereals and leafy vegetables.  o Drink six (6) ounces of prune or apricot juice each morning.  o Eat two (2) large servings of stewed fruit each day.  o Take one (1) heaping tablespoon of a psyllium-based bulking agent twice a day. Use sugar-free sweetener when possible to avoid excessive calories.  o Eat a normal breakfast.  o Set aside 15 minutes after breakfast to  sit on the toilet, but do not strain to have a bowel movement.  o If you do not have a bowel movement by the third day, use an enema and repeat the above steps.

## 2015-08-29 NOTE — Progress Notes (Signed)
Pre visit review using our clinic review tool, if applicable. No additional management support is needed unless otherwise documented below in the visit note. 

## 2015-10-14 ENCOUNTER — Other Ambulatory Visit: Payer: Self-pay | Admitting: *Deleted

## 2015-10-14 ENCOUNTER — Telehealth: Payer: Self-pay | Admitting: Cardiovascular Disease

## 2015-10-14 MED ORDER — LISINOPRIL 10 MG PO TABS: ORAL_TABLET | ORAL | Status: DC

## 2015-10-14 MED ORDER — ATORVASTATIN CALCIUM 40 MG PO TABS: 40.0000 mg | ORAL_TABLET | Freq: Every day | ORAL | Status: DC

## 2015-10-14 NOTE — Telephone Encounter (Signed)
°*  STAT* If patient is at the pharmacy, call can be transferred to refill team.   1. Which medications need to be refilled? (please list name of each medication and dose if known) Lisinopril 10 mg po daily and lipitor 40 mg po daily   2. Which pharmacy/location (including street and city if local pharmacy) is medication to be sent to?  [facility] [address] [place]   3. Do they need a 30 day or 90 day supply? [place]

## 2015-10-14 NOTE — Telephone Encounter (Signed)
Requested Prescriptions   Signed Prescriptions Disp Refills  . atorvastatin (LIPITOR) 40 MG tablet 90 tablet 3    Sig: Take 1 tablet (40 mg total) by mouth daily.    Authorizing Provider: Minna Merritts    Ordering User: Eugenio Hoes, MARINA C  . lisinopril (PRINIVIL,ZESTRIL) 10 MG tablet 90 tablet 3    Sig: TAKE ONE TABLET BY MOUTH ONCE DAILY    Authorizing Provider: Minna Merritts    Ordering User: Britt Bottom

## 2016-02-04 ENCOUNTER — Other Ambulatory Visit (HOSPITAL_COMMUNITY): Payer: Self-pay | Admitting: Urology

## 2016-02-04 ENCOUNTER — Ambulatory Visit (HOSPITAL_COMMUNITY)
Admission: RE | Admit: 2016-02-04 | Discharge: 2016-02-04 | Disposition: A | Discharge status: Home / Self Care | Payer: BC Managed Care – PPO | Source: Ambulatory Visit | Attending: Urology | Admitting: Urology

## 2016-02-04 DIAGNOSIS — D49511 Neoplasm of unspecified behavior of right kidney: Secondary | ICD-10-CM

## 2016-02-04 DIAGNOSIS — D4101 Neoplasm of uncertain behavior of right kidney: Secondary | ICD-10-CM

## 2016-03-31 ENCOUNTER — Other Ambulatory Visit: Payer: Self-pay | Admitting: Urology

## 2016-05-04 ENCOUNTER — Encounter (HOSPITAL_COMMUNITY): Payer: Self-pay | Admitting: *Deleted

## 2016-05-04 NOTE — Patient Instructions (Signed)
Chirstopher Schwarz Maugeri  05/04/2016   Your procedure is scheduled on:    05/08/2016    Report to [facility] Main  Entrance take [place]  elevators to 3rd floor to  [place] at    0930 AM.  Call this number if you have problems the morning of surgery [phone]   Remember: ONLY 1 PERSON MAY GO WITH YOU TO SHORT STAY TO GET  READY MORNING OF [place].  Do not eat food or drink liquids :After Midnight.             Clear liquid diet the day before surgery.               Magnesium citrate- one bottle by 12 noon day before surgery.      Take these medicines the morning of surgery with A SIP OF WATER: Metoprolol ( Lopressor)                                You may not have any metal on your body including hair pins and              piercings  Do not wear jewelry, , lotions, powders or perfumes, deodorant                          Men may shave face and neck.   Do not bring valuables to the hospital. [place].  Contacts, dentures or bridgework may not be worn into surgery.  Leave suitcase in the car. After surgery it may be brought to your room.         Special Instructions: N/A              Please read over the following fact sheets you were given: _____________________________________________________________________                CLEAR LIQUID DIET   Foods Allowed                                                                     Foods Excluded  Coffee and tea, regular and decaf                             liquids that you cannot  Plain Jell-O in any flavor                                             see through such as: Fruit ices (not with fruit pulp)                                     milk, soups, orange juice  Iced Popsicles  All solid food Carbonated beverages, regular and diet                                    Cranberry, grape and apple juices Sports  drinks like Gatorade Lightly seasoned clear broth or consume(fat free) Sugar, honey syrup  Sample Menu Breakfast                                Lunch                                     Supper Cranberry juice                    Beef broth                            Chicken broth Jell-O                                     Grape juice                           Apple juice Coffee or tea                        Jell-O                                      Popsicle                                                Coffee or tea                        Coffee or tea  _____________________________________________________________________  [facility] - Preparing for Surgery Before surgery, you can play an important role.  Because skin is not sterile, your skin needs to be as free of germs as possible.  You can reduce the number of germs on your skin by washing with CHG (chlorahexidine gluconate) soap before surgery.  CHG is an antiseptic cleaner which kills germs and bonds with the skin to continue killing germs even after washing. Please DO NOT use if you have an allergy to CHG or antibacterial soaps.  If your skin becomes reddened/irritated stop using the CHG and inform your nurse when you arrive at Short Stay. Do not shave (including legs and underarms) for at least 48 hours prior to the first CHG shower.  You may shave your face/neck. Please follow these instructions carefully:  1.  Shower with CHG Soap the night before surgery and the  morning of Surgery.  2.  If you choose to wash your hair, wash your hair first as usual with your  normal  shampoo.  3.  After you shampoo, rinse your hair and body thoroughly to remove the  shampoo.  4.  Use CHG as you would any other liquid soap.  You can apply chg directly  to the skin and wash                       Gently with a scrungie or clean washcloth.  5.  Apply the CHG Soap to your body ONLY FROM THE NECK DOWN.   Do not use on face/  open                           Wound or open sores. Avoid contact with eyes, ears mouth and genitals (private parts).                       Wash face,  Genitals (private parts) with your normal soap.             6.  Wash thoroughly, paying special attention to the area where your surgery  will be performed.  7.  Thoroughly rinse your body with warm water from the neck down.  8.  DO NOT shower/wash with your normal soap after using and rinsing off  the CHG Soap.                9.  Pat yourself dry with a clean towel.            10.  Wear clean pajamas.            11.  Place clean sheets on your bed the night of your first shower and do not  sleep with pets. Day of Surgery : Do not apply any lotions/deodorants the morning of surgery.  Please wear clean clothes to the hospital/surgery center.  FAILURE TO FOLLOW THESE INSTRUCTIONS MAY RESULT IN THE CANCELLATION OF YOUR SURGERY PATIENT SIGNATURE_________________________________  NURSE SIGNATURE__________________________________  ________________________________________________________________________  WHAT IS A BLOOD TRANSFUSION? Blood Transfusion Information  A transfusion is the replacement of blood or some of its parts. Blood is made up of multiple cells which provide different functions.  Red blood cells carry oxygen and are used for blood loss replacement.  White blood cells fight against infection.  Platelets control bleeding.  Plasma helps clot blood.  Other blood products are available for specialized needs, such as hemophilia or other clotting disorders. BEFORE THE TRANSFUSION  Who gives blood for transfusions?   Healthy volunteers who are fully evaluated to make sure their blood is safe. This is blood bank blood. Transfusion therapy is the safest it has ever been in the practice of medicine. Before blood is taken from a donor, a complete history is taken to make sure that person has no history of diseases nor engages in risky  social behavior (examples are intravenous drug use or sexual activity with multiple partners). The donor's travel history is screened to minimize risk of transmitting infections, such as malaria. The donated blood is tested for signs of infectious diseases, such as HIV and hepatitis. The blood is then tested to be sure it is compatible with you in order to minimize the chance of a transfusion reaction. If you or a relative donates blood, this is often done in anticipation of surgery and is not appropriate for emergency situations. It takes many days to process the donated blood. RISKS AND COMPLICATIONS Although transfusion therapy is very safe and saves many lives, the main dangers of transfusion include:   Getting an infectious disease.  Developing a transfusion reaction.  This is an allergic reaction to something in the blood you were given. Every precaution is taken to prevent this. The decision to have a blood transfusion has been considered carefully by your caregiver before blood is given. Blood is not given unless the benefits outweigh the risks. AFTER THE TRANSFUSION  Right after receiving a blood transfusion, you will usually feel much better and more energetic. This is especially true if your red blood cells have gotten low (anemic). The transfusion raises the level of the red blood cells which carry oxygen, and this usually causes an energy increase.  The nurse administering the transfusion will monitor you carefully for complications. HOME CARE INSTRUCTIONS  No special instructions are needed after a transfusion. You may find your energy is better. Speak with your caregiver about any limitations on activity for underlying diseases you may have. SEEK MEDICAL CARE IF:   Your condition is not improving after your transfusion.  You develop redness or irritation at the intravenous (IV) site. SEEK IMMEDIATE MEDICAL CARE IF:  Any of the following symptoms occur over the next 12  hours:  Shaking chills.  You have a temperature by mouth above 102 F (38.9 C), not controlled by medicine.  Chest, back, or muscle pain.  People around you feel you are not acting correctly or are confused.  Shortness of breath or difficulty breathing.  Dizziness and fainting.  You get a rash or develop hives.  You have a decrease in urine output.  Your urine turns a dark color or changes to pink, red, or brown. Any of the following symptoms occur over the next 10 days:  You have a temperature by mouth above 102 F (38.9 C), not controlled by medicine.  Shortness of breath.  Weakness after normal activity.  The white part of the eye turns yellow (jaundice).  You have a decrease in the amount of urine or are urinating less often.  Your urine turns a dark color or changes to pink, red, or brown. Document Released: 04/03/2000 Document Revised: 06/29/2011 Document Reviewed: 11/21/2007 [facility] Patient Information 2014 [place], [place].  _______________________________________________________________________

## 2016-05-05 ENCOUNTER — Encounter (HOSPITAL_COMMUNITY)
Admission: RE | Admit: 2016-05-05 | Discharge: 2016-05-05 | Disposition: A | Discharge status: Home / Self Care | Payer: BC Managed Care – PPO | Source: Ambulatory Visit | Attending: Urology | Admitting: Urology

## 2016-05-05 ENCOUNTER — Other Ambulatory Visit: Payer: Self-pay

## 2016-05-05 ENCOUNTER — Encounter (HOSPITAL_COMMUNITY): Payer: Self-pay

## 2016-05-05 DIAGNOSIS — N2889 Other specified disorders of kidney and ureter: Secondary | ICD-10-CM

## 2016-05-05 DIAGNOSIS — Z01812 Encounter for preprocedural laboratory examination: Secondary | ICD-10-CM

## 2016-05-05 HISTORY — DX: Personal history of urinary calculi: Z87.442

## 2016-05-05 LAB — CBC
HCT: 45.2 % (ref 39.0–52.0)
HEMOGLOBIN: 15.6 g/dL (ref 13.0–17.0)
MCH: 30.3 pg (ref 26.0–34.0)
MCHC: 34.5 g/dL (ref 30.0–36.0)
MCV: 87.8 fL (ref 78.0–100.0)
Platelets: 151 10*3/uL (ref 150–400)
RBC: 5.15 MIL/uL (ref 4.22–5.81)
RDW: 13.4 % (ref 11.5–15.5)
WBC: 6.3 10*3/uL (ref 4.0–10.5)

## 2016-05-05 LAB — BASIC METABOLIC PANEL
ANION GAP: 6 (ref 5–15)
BUN: 16 mg/dL (ref 6–20)
CALCIUM: 8.9 mg/dL (ref 8.9–10.3)
CO2: 27 mmol/L (ref 22–32)
Chloride: 106 mmol/L (ref 101–111)
Creatinine, Ser: 0.9 mg/dL (ref 0.61–1.24)
GLUCOSE: 98 mg/dL (ref 65–99)
POTASSIUM: 4.3 mmol/L (ref 3.5–5.1)
SODIUM: 139 mmol/L (ref 135–145)

## 2016-05-05 LAB — ABO/RH: ABO/RH(D): B POS

## 2016-05-05 NOTE — Progress Notes (Signed)
02/04/16- CXR- epic  04/30/15- LOV- card-epic

## 2016-05-06 NOTE — Progress Notes (Signed)
Final EKG done 05/05/16 - epic

## 2016-05-08 ENCOUNTER — Inpatient Hospital Stay (HOSPITAL_COMMUNITY): Payer: BC Managed Care – PPO | Admitting: Certified Registered Nurse Anesthetist

## 2016-05-08 ENCOUNTER — Encounter (HOSPITAL_COMMUNITY)
Admission: RE | Disposition: A | Discharge status: Home / Self Care | Payer: Self-pay | Source: Ambulatory Visit | Attending: Urology

## 2016-05-08 ENCOUNTER — Encounter (HOSPITAL_COMMUNITY): Payer: Self-pay | Admitting: Certified Registered Nurse Anesthetist

## 2016-05-08 ENCOUNTER — Inpatient Hospital Stay (HOSPITAL_COMMUNITY)
Admission: RE | Admit: 2016-05-08 | Discharge: 2016-05-09 | DRG: 658 | Disposition: A | Discharge status: Home / Self Care | Payer: BC Managed Care – PPO | Source: Ambulatory Visit | Attending: Urology | Admitting: Urology

## 2016-05-08 DIAGNOSIS — I10 Essential (primary) hypertension: Secondary | ICD-10-CM | POA: Diagnosis present

## 2016-05-08 DIAGNOSIS — I252 Old myocardial infarction: Secondary | ICD-10-CM

## 2016-05-08 DIAGNOSIS — Z87891 Personal history of nicotine dependence: Secondary | ICD-10-CM | POA: Diagnosis not present

## 2016-05-08 DIAGNOSIS — I2510 Atherosclerotic heart disease of native coronary artery without angina pectoris: Secondary | ICD-10-CM | POA: Diagnosis present

## 2016-05-08 DIAGNOSIS — Z9049 Acquired absence of other specified parts of digestive tract: Secondary | ICD-10-CM

## 2016-05-08 DIAGNOSIS — Z955 Presence of coronary angioplasty implant and graft: Secondary | ICD-10-CM | POA: Diagnosis not present

## 2016-05-08 DIAGNOSIS — G4733 Obstructive sleep apnea (adult) (pediatric): Secondary | ICD-10-CM | POA: Diagnosis present

## 2016-05-08 DIAGNOSIS — Z87442 Personal history of urinary calculi: Secondary | ICD-10-CM

## 2016-05-08 DIAGNOSIS — Z888 Allergy status to other drugs, medicaments and biological substances status: Secondary | ICD-10-CM

## 2016-05-08 DIAGNOSIS — C641 Malignant neoplasm of right kidney, except renal pelvis: Secondary | ICD-10-CM | POA: Diagnosis present

## 2016-05-08 DIAGNOSIS — N529 Male erectile dysfunction, unspecified: Secondary | ICD-10-CM | POA: Diagnosis present

## 2016-05-08 DIAGNOSIS — Z8249 Family history of ischemic heart disease and other diseases of the circulatory system: Secondary | ICD-10-CM

## 2016-05-08 DIAGNOSIS — N2889 Other specified disorders of kidney and ureter: Secondary | ICD-10-CM | POA: Diagnosis present

## 2016-05-08 DIAGNOSIS — Z800 Family history of malignant neoplasm of digestive organs: Secondary | ICD-10-CM | POA: Diagnosis not present

## 2016-05-08 DIAGNOSIS — Z885 Allergy status to narcotic agent status: Secondary | ICD-10-CM

## 2016-05-08 DIAGNOSIS — I69392 Facial weakness following cerebral infarction: Secondary | ICD-10-CM | POA: Diagnosis not present

## 2016-05-08 DIAGNOSIS — E785 Hyperlipidemia, unspecified: Secondary | ICD-10-CM | POA: Diagnosis present

## 2016-05-08 HISTORY — PX: ROBOTIC ASSITED PARTIAL NEPHRECTOMY: SHX6087

## 2016-05-08 LAB — TYPE AND SCREEN
ABO/RH(D): B POS
ANTIBODY SCREEN: NEGATIVE

## 2016-05-08 LAB — HEMOGLOBIN AND HEMATOCRIT, BLOOD
HCT: 45 % (ref 39.0–52.0)
Hemoglobin: 15.7 g/dL (ref 13.0–17.0)

## 2016-05-08 SURGERY — ROBOTIC ASSITED PARTIAL NEPHRECTOMY
Anesthesia: General | Laterality: Right

## 2016-05-08 MED ORDER — ACETAMINOPHEN 500 MG PO TABS
1000.0000 mg | ORAL_TABLET | Freq: Four times a day (QID) | ORAL | Status: AC
  Administered 2016-05-08 – 2016-05-09 (×4): 1000 mg via ORAL
  Filled 2016-05-08 (×4): qty 2

## 2016-05-08 MED ORDER — ROCURONIUM BROMIDE 50 MG/5ML IV SOSY
PREFILLED_SYRINGE | INTRAVENOUS | Status: AC
  Filled 2016-05-08: qty 5

## 2016-05-08 MED ORDER — PROPOFOL 10 MG/ML IV BOLUS
INTRAVENOUS | Status: AC
  Filled 2016-05-08: qty 20

## 2016-05-08 MED ORDER — SUGAMMADEX SODIUM 500 MG/5ML IV SOLN
INTRAVENOUS | Status: DC | PRN
  Administered 2016-05-08: 200 mg via INTRAVENOUS

## 2016-05-08 MED ORDER — SENNOSIDES-DOCUSATE SODIUM 8.6-50 MG PO TABS
2.0000 | ORAL_TABLET | Freq: Every day | ORAL | Status: DC
  Administered 2016-05-08: 2 via ORAL
  Filled 2016-05-08: qty 2

## 2016-05-08 MED ORDER — METOPROLOL TARTRATE 25 MG PO TABS
25.0000 mg | ORAL_TABLET | Freq: Two times a day (BID) | ORAL | Status: DC
  Administered 2016-05-08 – 2016-05-09 (×2): 25 mg via ORAL
  Filled 2016-05-08 (×2): qty 1

## 2016-05-08 MED ORDER — OXYCODONE-ACETAMINOPHEN 5-325 MG PO TABS: 1.0000 | ORAL_TABLET | ORAL | 0 refills | Status: DC | PRN

## 2016-05-08 MED ORDER — LIDOCAINE HCL (CARDIAC) 20 MG/ML IV SOLN
INTRAVENOUS | Status: DC | PRN
  Administered 2016-05-08: 100 mg via INTRAVENOUS

## 2016-05-08 MED ORDER — METOCLOPRAMIDE HCL 5 MG/ML IJ SOLN
INTRAMUSCULAR | Status: DC | PRN
  Administered 2016-05-08: 5 mg via INTRAVENOUS

## 2016-05-08 MED ORDER — PHENYLEPHRINE HCL 10 MG/ML IJ SOLN
INTRAMUSCULAR | Status: DC | PRN
  Administered 2016-05-08: 20 ug via INTRAVENOUS

## 2016-05-08 MED ORDER — DEXAMETHASONE SODIUM PHOSPHATE 10 MG/ML IJ SOLN
INTRAMUSCULAR | Status: AC
Start: 2016-05-08 — End: 2016-05-08
  Filled 2016-05-08: qty 1

## 2016-05-08 MED ORDER — CEFAZOLIN SODIUM-DEXTROSE 2-4 GM/100ML-% IV SOLN
2.0000 g | INTRAVENOUS | Status: DC
  Filled 2016-05-08: qty 100

## 2016-05-08 MED ORDER — ATORVASTATIN CALCIUM 40 MG PO TABS
40.0000 mg | ORAL_TABLET | Freq: Every day | ORAL | Status: DC
  Administered 2016-05-08 – 2016-05-09 (×2): 40 mg via ORAL
  Filled 2016-05-08 (×2): qty 1

## 2016-05-08 MED ORDER — BUPIVACAINE LIPOSOME 1.3 % IJ SUSP
INTRAMUSCULAR | Status: DC | PRN
  Administered 2016-05-08: 20 mL

## 2016-05-08 MED ORDER — FENTANYL CITRATE (PF) 100 MCG/2ML IJ SOLN
INTRAMUSCULAR | Status: DC | PRN
  Administered 2016-05-08: 50 ug via INTRAVENOUS
  Administered 2016-05-08: 25 ug via INTRAVENOUS
  Administered 2016-05-08 (×2): 50 ug via INTRAVENOUS
  Administered 2016-05-08: 25 ug via INTRAVENOUS
  Administered 2016-05-08: 50 ug via INTRAVENOUS

## 2016-05-08 MED ORDER — BUPIVACAINE LIPOSOME 1.3 % IJ SUSP
INTRAMUSCULAR | Status: AC
  Filled 2016-05-08: qty 20

## 2016-05-08 MED ORDER — MIDAZOLAM HCL 5 MG/5ML IJ SOLN
INTRAMUSCULAR | Status: DC | PRN
  Administered 2016-05-08: 1 mg via INTRAVENOUS

## 2016-05-08 MED ORDER — OXYCODONE HCL 5 MG PO TABS
5.0000 mg | ORAL_TABLET | ORAL | Status: DC | PRN
  Administered 2016-05-09 (×2): 5 mg via ORAL
  Filled 2016-05-08 (×3): qty 1

## 2016-05-08 MED ORDER — NITROGLYCERIN 0.4 MG SL SUBL: 0.4000 mg | SUBLINGUAL_TABLET | SUBLINGUAL | Status: DC | PRN

## 2016-05-08 MED ORDER — LACTATED RINGERS IV SOLN
INTRAVENOUS | Status: DC
  Administered 2016-05-08: 1000 mL via INTRAVENOUS

## 2016-05-08 MED ORDER — ROCURONIUM BROMIDE 100 MG/10ML IV SOLN
INTRAVENOUS | Status: DC | PRN
  Administered 2016-05-08: 50 mg via INTRAVENOUS
  Administered 2016-05-08: 20 mg via INTRAVENOUS
  Administered 2016-05-08: 10 mg via INTRAVENOUS

## 2016-05-08 MED ORDER — LACTATED RINGERS IV SOLN
INTRAVENOUS | Status: DC | PRN
  Administered 2016-05-08 (×3): via INTRAVENOUS

## 2016-05-08 MED ORDER — SODIUM CHLORIDE 0.9 % IJ SOLN
INTRAMUSCULAR | Status: DC | PRN
  Administered 2016-05-08: 20 mL

## 2016-05-08 MED ORDER — CEFAZOLIN SODIUM-DEXTROSE 2-4 GM/100ML-% IV SOLN
INTRAVENOUS | Status: AC
  Filled 2016-05-08: qty 100

## 2016-05-08 MED ORDER — PROMETHAZINE HCL 25 MG/ML IJ SOLN: 6.2500 mg | INTRAMUSCULAR | Status: DC | PRN

## 2016-05-08 MED ORDER — PROPOFOL 10 MG/ML IV BOLUS
INTRAVENOUS | Status: DC | PRN
  Administered 2016-05-08: 175 mg via INTRAVENOUS

## 2016-05-08 MED ORDER — SUGAMMADEX SODIUM 200 MG/2ML IV SOLN
INTRAVENOUS | Status: AC
Start: 2016-05-08 — End: 2016-05-08
  Filled 2016-05-08: qty 2

## 2016-05-08 MED ORDER — LACTATED RINGERS IR SOLN
Status: DC | PRN
  Administered 2016-05-08: 1000 mL

## 2016-05-08 MED ORDER — LIDOCAINE 2% (20 MG/ML) 5 ML SYRINGE
INTRAMUSCULAR | Status: AC
  Filled 2016-05-08: qty 5

## 2016-05-08 MED ORDER — MORPHINE SULFATE (PF) 2 MG/ML IV SOLN: 2.0000 mg | INTRAVENOUS | Status: DC | PRN

## 2016-05-08 MED ORDER — MIDAZOLAM HCL 2 MG/2ML IJ SOLN
INTRAMUSCULAR | Status: AC
  Filled 2016-05-08: qty 2

## 2016-05-08 MED ORDER — KCL IN DEXTROSE-NACL 20-5-0.45 MEQ/L-%-% IV SOLN
INTRAVENOUS | Status: DC
  Administered 2016-05-08 – 2016-05-09 (×2): via INTRAVENOUS
  Filled 2016-05-08 (×2): qty 1000

## 2016-05-08 MED ORDER — MORPHINE SULFATE (PF) 2 MG/ML IV SOLN
INTRAVENOUS | Status: AC
  Filled 2016-05-08: qty 1

## 2016-05-08 MED ORDER — CEFAZOLIN SODIUM-DEXTROSE 2-3 GM-% IV SOLR
INTRAVENOUS | Status: DC | PRN
  Administered 2016-05-08: 2 g via INTRAVENOUS

## 2016-05-08 MED ORDER — MORPHINE SULFATE (PF) 4 MG/ML IV SOLN
INTRAVENOUS | Status: AC
  Filled 2016-05-08: qty 1

## 2016-05-08 MED ORDER — ONDANSETRON HCL 4 MG/2ML IJ SOLN
INTRAMUSCULAR | Status: AC
  Filled 2016-05-08: qty 2

## 2016-05-08 MED ORDER — FENTANYL CITRATE (PF) 250 MCG/5ML IJ SOLN
INTRAMUSCULAR | Status: AC
  Filled 2016-05-08: qty 5

## 2016-05-08 MED ORDER — MORPHINE SULFATE (PF) 10 MG/ML IV SOLN
1.0000 mg | INTRAVENOUS | Status: DC | PRN
  Administered 2016-05-08 (×3): 2 mg via INTRAVENOUS

## 2016-05-08 MED ORDER — STERILE WATER FOR IRRIGATION IR SOLN
Status: DC | PRN
  Administered 2016-05-08: 1000 mL

## 2016-05-08 MED ORDER — ONDANSETRON HCL 4 MG/2ML IJ SOLN
INTRAMUSCULAR | Status: DC | PRN
Start: 2016-05-08 — End: 2016-05-08
  Administered 2016-05-08: 4 mg via INTRAVENOUS

## 2016-05-08 MED ORDER — SUCCINYLCHOLINE CHLORIDE 20 MG/ML IJ SOLN
INTRAMUSCULAR | Status: DC | PRN
  Administered 2016-05-08: 120 mg via INTRAVENOUS

## 2016-05-08 MED ORDER — DEXAMETHASONE SODIUM PHOSPHATE 10 MG/ML IJ SOLN
INTRAMUSCULAR | Status: DC | PRN
Start: 2016-05-08 — End: 2016-05-08
  Administered 2016-05-08: 10 mg via INTRAVENOUS

## 2016-05-08 SURGICAL SUPPLY — 74 items
ADH SKN CLS APL DERMABOND .7 (GAUZE/BANDAGES/DRESSINGS) ×1
AGENT HMST KT MTR STRL THRMB (HEMOSTASIS) ×1
APL ESCP 34 STRL LF DISP (HEMOSTASIS) ×1
APPLICATOR SURGIFLO ENDO (HEMOSTASIS) ×3 IMPLANT
BAG SPEC RTRVL LRG 6X4 10 (ENDOMECHANICALS) ×1
CHLORAPREP W/TINT 26ML (MISCELLANEOUS) ×3 IMPLANT
CLIP LIGATING HEM O LOK PURPLE (MISCELLANEOUS) ×3 IMPLANT
CLIP LIGATING HEMO LOK XL GOLD (MISCELLANEOUS) IMPLANT
CLIP LIGATING HEMO O LOK GREEN (MISCELLANEOUS) ×8 IMPLANT
CLIP SUT LAPRA TY ABSORB (SUTURE) ×3 IMPLANT
COVER SURGICAL LIGHT HANDLE (MISCELLANEOUS) ×3 IMPLANT
COVER TIP SHEARS 8 DVNC (MISCELLANEOUS) ×1 IMPLANT
COVER TIP SHEARS 8MM DA VINCI (MISCELLANEOUS) ×2
DECANTER SPIKE VIAL GLASS SM (MISCELLANEOUS) ×3 IMPLANT
DERMABOND ADVANCED (GAUZE/BANDAGES/DRESSINGS) ×2
DERMABOND ADVANCED .7 DNX12 (GAUZE/BANDAGES/DRESSINGS) ×1 IMPLANT
DRAIN CHANNEL 15F RND FF 3/16 (WOUND CARE) ×3 IMPLANT
DRAPE ARM DVNC X/XI (DISPOSABLE) ×4 IMPLANT
DRAPE COLUMN DVNC XI (DISPOSABLE) ×1 IMPLANT
DRAPE DA VINCI XI ARM (DISPOSABLE) ×8
DRAPE DA VINCI XI COLUMN (DISPOSABLE) ×2
DRAPE INCISE IOBAN 66X45 STRL (DRAPES) ×3 IMPLANT
DRAPE LAPAROSCOPIC ABDOMINAL (DRAPES) ×3 IMPLANT
DRAPE SHEET LG 3/4 BI-LAMINATE (DRAPES) ×3 IMPLANT
ELECT PENCIL ROCKER SW 15FT (MISCELLANEOUS) ×3 IMPLANT
ELECT REM PT RETURN 9FT ADLT (ELECTROSURGICAL) ×3
ELECTRODE REM PT RTRN 9FT ADLT (ELECTROSURGICAL) ×1 IMPLANT
EVACUATOR SILICONE 100CC (DRAIN) ×3 IMPLANT
GLOVE BIO SURGEON STRL SZ 6.5 (GLOVE) ×2 IMPLANT
GLOVE BIO SURGEONS STRL SZ 6.5 (GLOVE) ×1
GLOVE BIOGEL M STRL SZ7.5 (GLOVE) ×6 IMPLANT
GOWN STRL REUS W/TWL LRG LVL3 (GOWN DISPOSABLE) ×6 IMPLANT
HEMOSTAT SURGICEL 4X8 (HEMOSTASIS) ×3 IMPLANT
IRRIG SUCT STRYKERFLOW 2 WTIP (MISCELLANEOUS)
IRRIGATION SUCT STRKRFLW 2 WTP (MISCELLANEOUS) IMPLANT
KIT BASIN OR (CUSTOM PROCEDURE TRAY) ×3 IMPLANT
LOOP VESSEL MAXI BLUE (MISCELLANEOUS) ×3 IMPLANT
MARKER SKIN DUAL TIP RULER LAB (MISCELLANEOUS) ×3 IMPLANT
NDL INSUFFLATION 14GA 120MM (NEEDLE) ×1 IMPLANT
NEEDLE INSUFFLATION 14GA 120MM (NEEDLE) ×3 IMPLANT
NS IRRIG 1000ML POUR BTL (IV SOLUTION) ×3 IMPLANT
PORT ACCESS TROCAR AIRSEAL 12 (TROCAR) ×1 IMPLANT
PORT ACCESS TROCAR AIRSEAL 5M (TROCAR) ×2
POSITIONER SURGICAL ARM (MISCELLANEOUS) ×6 IMPLANT
POUCH SPECIMEN RETRIEVAL 10MM (ENDOMECHANICALS) ×3 IMPLANT
RELOAD STAPLE 60 2.6 WHT THN (STAPLE) IMPLANT
RELOAD STAPLER WHITE 60MM (STAPLE) IMPLANT
SEAL CANN UNIV 5-8 DVNC XI (MISCELLANEOUS) IMPLANT
SEAL XI 5MM-8MM UNIVERSAL (MISCELLANEOUS) ×8
SET TRI-LUMEN FLTR TB AIRSEAL (TUBING) ×3 IMPLANT
SOLUTION ELECTROLUBE (MISCELLANEOUS) ×3 IMPLANT
SPONGE LAP 4X18 X RAY DECT (DISPOSABLE) ×3 IMPLANT
STAPLE ECHEON FLEX 60 POW ENDO (STAPLE) IMPLANT
STAPLER RELOAD WHITE 60MM (STAPLE)
SURGIFLO W/THROMBIN 8M KIT (HEMOSTASIS) ×3 IMPLANT
SUT ETHILON 3 0 PS 1 (SUTURE) ×3 IMPLANT
SUT MNCRL AB 4-0 PS2 18 (SUTURE) ×6 IMPLANT
SUT PDS AB 1 CT1 27 (SUTURE) ×6 IMPLANT
SUT V-LOC BARB 180 2/0GR6 GS22 (SUTURE)
SUT VIC AB 0 CT1 27 (SUTURE) ×12
SUT VIC AB 0 CT1 27XBRD ANTBC (SUTURE) ×4 IMPLANT
SUT VIC AB 2-0 SH 27 (SUTURE) ×6
SUT VIC AB 2-0 SH 27X BRD (SUTURE) ×2 IMPLANT
SUT VLOC BARB 180 ABS3/0GR12 (SUTURE) ×6
SUTURE V-LC BRB 180 2/0GR6GS22 (SUTURE) IMPLANT
SUTURE VLOC BRB 180 ABS3/0GR12 (SUTURE) ×1 IMPLANT
TAPE STRIPS DRAPE STRL (GAUZE/BANDAGES/DRESSINGS) ×3 IMPLANT
TOWEL OR 17X26 10 PK STRL BLUE (TOWEL DISPOSABLE) ×6 IMPLANT
TOWEL OR NON WOVEN STRL DISP B (DISPOSABLE) ×3 IMPLANT
TRAY FOLEY W/METER SILVER 16FR (SET/KITS/TRAYS/PACK) ×3 IMPLANT
TRAY LAPAROSCOPIC (CUSTOM PROCEDURE TRAY) ×3 IMPLANT
TROCAR BLADELESS OPT 5 100 (ENDOMECHANICALS) IMPLANT
TROCAR XCEL 12X100 BLDLESS (ENDOMECHANICALS) ×3 IMPLANT
WATER STERILE IRR 1500ML POUR (IV SOLUTION) ×2 IMPLANT

## 2016-05-08 NOTE — Discharge Instructions (Signed)

## 2016-05-08 NOTE — Brief Op Note (Signed)
05/08/2016  2:51 PM  PATIENT:  Jacob Marks  56 y.o. male  PRE-OPERATIVE DIAGNOSIS:  RIGHT RENAL MASS  POST-OPERATIVE DIAGNOSIS:  RIGHT RENAL MASS  PROCEDURE:  Procedure(s): ROBOTIC ASSITED RETROPERITONEAL PARTIAL NEPHRECTOMY (Right)  SURGEON:  Surgeon(s) and Role:    * Alexis Frock, MD - Primary  PHYSICIAN ASSISTANT:   ASSISTANTS: Azucena Fallen, NP   ANESTHESIA:   local and general  EBL:  Total I/O In: 1500 [I.V.:1500] Out: 250 [Urine:200; Blood:50]  BLOOD ADMINISTERED:none  DRAINS: 1 - JP to bulb, 2 - Foley to gravity   LOCAL MEDICATIONS USED:  MARCAINE     SPECIMEN:  Source of Specimen:  1 - Rt renal mass (disrupted at extraction)  DISPOSITION OF SPECIMEN:  PATHOLOGY  COUNTS:  YES  TOURNIQUET:  * No tourniquets in log *  DICTATION: .Other Dictation: Dictation Number [id number]  PLAN OF CARE: Admit to inpatient   PATIENT DISPOSITION:  PACU - hemodynamically stable.   Delay start of Pharmacological VTE agent (>24hrs) due to surgical blood loss or risk of bleeding: yes

## 2016-05-08 NOTE — Discharge Summary (Signed)
Date of admission: 05/08/2016  Date of discharge: 05/09/2016  Admission diagnosis: Right renal mass  Discharge diagnosis: Right renal mass  History and Physical: For full details, please see admission history and physical. Briefly, Jacob Marks is a 56 y.o. gentleman with localized prostate cancer.  After discussing management/treatment options, he elected to proceed with surgical treatment.  Hospital Course: ZARION OLIFF was taken to the operating room on 05/08/2016 and underwent a robotic assisted laparoscopic retroperitoneal right partial neprectomy. He tolerated this procedure well and without complications. Postoperatively, he was able to be transferred to a regular hospital room following recovery from anesthesia.  He remained hemodynamically stable overnight.  He had excellent urine output with appropriately minimal output from his drain, with serum JP creatinine and his drain was removed on POD #1.  He was transitioned to oral pain medication, tolerated a regular diet, and had met all discharge criteria and was able to be discharged home later on POD#1.  Laboratory values:   Recent Labs  05/08/16 1603 05/09/16 0546  HGB 15.7 14.2  HCT 45.0 41.1    Disposition: Home  Discharge instruction: He was instructed to be ambulatory but to refrain from heavy lifting, strenuous activity, or driving.    Discharge medications:   Allergies as of 05/09/2016      Reactions   Dilaudid [hydromorphone Hcl] Other (See Comments)   Passed out   Sildenafil Citrate Other (See Comments)   Patient is unfamiliar with this medication   Testosterone Rash   Rash from the patch      Medication List    STOP taking these medications   aspirin EC 81 MG tablet   ibuprofen 200 MG tablet Commonly known as:  ADVIL,MOTRIN     TAKE these medications   atorvastatin 40 MG tablet Commonly known as:  LIPITOR Take 1 tablet (40 mg total) by mouth daily. What changed:  when to take this   lisinopril 10 MG  tablet Commonly known as:  PRINIVIL,ZESTRIL TAKE ONE TABLET BY MOUTH ONCE DAILY What changed:  how much to take  how to take this  when to take this  additional instructions   metoprolol tartrate 25 MG tablet Commonly known as:  LOPRESSOR TAKE ONE TABLET BY MOUTH TWICE DAILY *PLEASE KEEP UPCOMING APPOINTMENT*   nitroGLYCERIN 0.4 MG SL tablet Commonly known as:  NITROSTAT Place 1 tablet (0.4 mg total) under the tongue every 5 (five) minutes as needed for chest pain.   oxyCODONE-acetaminophen 5-325 MG tablet Commonly known as:  ROXICET Take 1-2 tablets by mouth every 4 (four) hours as needed.       Followup: He will followup for post op check and to discuss his surgical pathology results.

## 2016-05-08 NOTE — Transfer of Care (Signed)
Immediate Anesthesia Transfer of Care Note  Patient: Jacob Marks  Procedure(s) Performed: Procedure(s): ROBOTIC ASSITED RETROPERITONEAL PARTIAL NEPHRECTOMY (Right)  Patient Location: PACU  Anesthesia Type:General  Level of Consciousness: awake, oriented, patient cooperative, lethargic and responds to stimulation  Airway & Oxygen Therapy: Patient Spontanous Breathing and Patient connected to face mask oxygen  Post-op Assessment: Report given to RN, Post -op Vital signs reviewed and stable and Patient moving all extremities  Post vital signs: Reviewed and stable  Last Vitals:  Vitals:   05/08/16 0912  BP: (!) 143/79  Pulse: (!) 58  Resp: 18  Temp: 37.1 C    Last Pain:  Vitals:   05/08/16 1103  TempSrc:   PainSc: 0-No pain      Patients Stated Pain Goal: 4 ([id number])  Complications: No apparent anesthesia complications

## 2016-05-08 NOTE — Anesthesia Postprocedure Evaluation (Addendum)
Anesthesia Post Note  Patient: Giovany Dehoff Mankowski  Procedure(s) Performed: Procedure(s) (LRB): ROBOTIC ASSITED RETROPERITONEAL PARTIAL NEPHRECTOMY (Right)  Patient location during evaluation: PACU Anesthesia Type: General Level of consciousness: awake, awake and alert and oriented Pain management: pain level controlled Vital Signs Assessment: post-procedure vital signs reviewed and stable Respiratory status: spontaneous breathing, nonlabored ventilation and respiratory function stable Cardiovascular status: blood pressure returned to baseline Anesthetic complications: no       Last Vitals:  Vitals:   05/08/16 1545 05/08/16 1600  BP: (!) 159/87   Pulse: 89   Resp: 14   Temp:  36.7 C    Last Pain:  Vitals:   05/08/16 1600  TempSrc:   PainSc: 4                  Lekha Dancer COKER

## 2016-05-08 NOTE — Anesthesia Procedure Notes (Signed)
Procedure Name: Intubation Date/Time: 05/08/2016 11:30 AM Performed by: Rica Koyanagi Pre-anesthesia Checklist: Patient identified, Emergency Drugs available, Suction available, Patient being monitored and Timeout performed Patient Re-evaluated:Patient Re-evaluated prior to inductionOxygen Delivery Method: Circle system utilized Preoxygenation: Pre-oxygenation with 100% oxygen Intubation Type: IV induction, Rapid sequence and Cricoid Pressure applied Laryngoscope Size: Mac and 4 Grade View: Grade II Tube type: Oral Tube size: 7.5 mm Number of attempts: 1 Airway Equipment and Method: Stylet Placement Confirmation: ETT inserted through vocal cords under direct vision,  positive ETCO2 and breath sounds checked- equal and bilateral Secured at: 22 cm Tube secured with: Tape Dental Injury: Teeth and Oropharynx as per pre-operative assessment

## 2016-05-08 NOTE — Anesthesia Preprocedure Evaluation (Addendum)
Anesthesia Evaluation  Patient identified by MRN, date of birth, ID band Patient awake    Reviewed: Allergy & Precautions, NPO status , Patient's Chart, lab work & pertinent test results  Airway Mallampati: III  TM Distance: <3 FB Neck ROM: Full    Dental no notable dental hx.    Pulmonary sleep apnea , former smoker,    Pulmonary exam normal breath sounds clear to auscultation       Cardiovascular hypertension, + CAD, + Past MI and + Cardiac Stents  Normal cardiovascular exam Rhythm:Regular Rate:Normal     Neuro/Psych CVA negative psych ROS   GI/Hepatic negative GI ROS, Neg liver ROS,   Endo/Other  negative endocrine ROS  Renal/GU Renal InsufficiencyRenal disease  negative genitourinary   Musculoskeletal negative musculoskeletal ROS (+)   Abdominal   Peds negative pediatric ROS (+)  Hematology negative hematology ROS (+)   Anesthesia Other Findings   Reproductive/Obstetrics negative OB ROS                            Anesthesia Physical Anesthesia Plan  ASA: III  Anesthesia Plan: General   Post-op Pain Management:    Induction: Intravenous  Airway Management Planned: Oral ETT  Additional Equipment:   Intra-op Plan:   Post-operative Plan: Extubation in OR  Informed Consent: I have reviewed the patients History and Physical, chart, labs and discussed the procedure including the risks, benefits and alternatives for the proposed anesthesia with the patient or authorized representative who has indicated his/her understanding and acceptance.   Dental advisory given  Plan Discussed with: CRNA and Surgeon  Anesthesia Plan Comments:         Anesthesia Quick Evaluation

## 2016-05-08 NOTE — H&P (Signed)
Jacob Marks is an 56 y.o. male.    Chief Complaint: Pre-op RIGHT retroperitoneal robotic partial nephrectomy  HPI:   1 - Right Renal Cancer - 2.8cm Rt mid lateral enhancing mass by CT 01/2016. 90% exophytic, solid, enhancing mass with progression from 1.2cm 2013. 2 artery (tiny accessory) / 1 vein Rt renovascular anatomy. He has had numerous opinions from urologists in the area.    PMH sig for CVA (no deficits), CAD/Stent (not limiting), colon resection / iliostomy / iliostomy takedown / abd hernia repair with mesh. His PCP is Loura Pardon MD.   Today "[name]" is seen to proceed with right retroperitoneal robotic partial nephrectomy.    Past Medical History:  Diagnosis Date  . Childhood asthma   . Chronic kidney disease    renal cyst right  . Coronary artery disease   . CVA (cerebral infarction) 10/2010   after his hemicolectomy; "some paralysis right side of mouth and slight speech problem as a result" (05/07/2014)  . Diverticulosis 7/12   with hemicolectomy-- complications   . ED (erectile dysfunction)   . Heart murmur    as a child  . History of kidney cancer    lesion on the right kidney  . History of kidney stones   . Hyperlipidemia   . Hypertension   . Kidney stones   . OSA (obstructive sleep apnea)    "has CPAP; doesn't wear it" (05/07/2014)  . Pes planus   . STEMI (ST elevation myocardial infarction) ([place]) 07/2012  . Stroke ([place])    slight right sided weakness - in face   . Testosterone deficiency     Past Surgical History:  Procedure Laterality Date  . ABDOMINAL EXPLORATION SURGERY  05/07/2014   w/LO;&  Repair multiple, incarcerated incisional hernias with mesh  . CARDIAC CATHETERIZATION    . carotid dopplers  8/12   normal - after cva   . COLON SURGERY    . CORONARY ANGIOPLASTY WITH STENT PLACEMENT  07/2012   to the distal RCA  . EXTRACORPOREAL SHOCK WAVE LITHOTRIPSY  2014  . HEMICOLECTOMY  10/2010   for diverticulosis, complic by leaking anastamosis/ abcess/  addn surg and iliostomy   . HERNIA REPAIR  05/07/2014  . ILEOSTOMY  10/2010  . ILEOSTOMY CLOSURE  06/2011  . INCISIONAL HERNIA REPAIR N/A 05/07/2014   Procedure: REPAIR RECURRENT INCISIONAL HERNIA;  Surgeon: Fanny Skates, MD;  Location: [facility];  Service: General;  Laterality: N/A;  . INSERTION OF MESH N/A 05/07/2014   Procedure: INSERTION OF MESH;  Surgeon: Fanny Skates, MD;  Location: [facility];  Service: General;  Laterality: N/A;  . TESTICLE SURGERY     as a child; "they didn't descend"  . TONSILLECTOMY AND ADENOIDECTOMY     as a child  . VASECTOMY      Family History  Problem Relation Age of Onset  . Heart disease Mother 55    MI  . Hypertension Mother   . Alcohol abuse Mother   . Heart disease Father   . Hypertension Father   . Cancer Sister     colon   Social History:  reports that he quit smoking about 38 years ago. His smoking use included Cigarettes. He has a 2.00 pack-year smoking history. He has never used smokeless tobacco. He reports that he drinks alcohol. He reports that he does not use drugs.  Allergies:  Allergies  Allergen Reactions  . Dilaudid [Hydromorphone Hcl] Other (See Comments)    Passed out  . Sildenafil  Citrate Other (See Comments)    Patient is unfamiliar with this medication  . Testosterone Rash    Rash from the patch    No prescriptions prior to admission.    No results found for this or any previous visit (from the past 48 hour(s)). No results found.  Review of Systems  Constitutional: Negative.  Negative for chills and fever.  HENT: Negative.   Eyes: Negative.   Respiratory: Negative.   Cardiovascular: Negative.   Gastrointestinal: Negative.   Genitourinary: Positive for hematuria.  Musculoskeletal: Negative.   Skin: Negative.   Neurological: Negative.   Endo/Heme/Allergies: Negative.   Psychiatric/Behavioral: Negative.     There were no vitals taken for this visit. Physical Exam  Constitutional: He appears well-developed.   HENT:  Head: Normocephalic.  Eyes: Pupils are equal, round, and reactive to light.  Neck: Normal range of motion.  Cardiovascular: Normal rate.   Respiratory: Effort normal.  GI: Soft.  abd scars w/o hernias.   Genitourinary:  Genitourinary Comments: No CVAT.   Musculoskeletal: Normal range of motion.  Neurological: He is alert.  Skin: Skin is warm.  Psychiatric: He has a normal mood and affect. His behavior is normal. Thought content normal.     Assessment/Plan  1 - Right Renal Cancer - proceed as planned with RIGHT robotic retroperitoneal partial nephrectomy. Risks, benefits, alternatives, expected peri-op course, need for possible open conversion discussed previously and reiterated today.   Alexis Frock, MD 05/08/2016, 6:23 AM

## 2016-05-09 LAB — BASIC METABOLIC PANEL
ANION GAP: 6 (ref 5–15)
BUN: 22 mg/dL — AB (ref 6–20)
CHLORIDE: 104 mmol/L (ref 101–111)
CO2: 26 mmol/L (ref 22–32)
Calcium: 8.6 mg/dL — ABNORMAL LOW (ref 8.9–10.3)
Creatinine, Ser: 1.2 mg/dL (ref 0.61–1.24)
Glucose, Bld: 154 mg/dL — ABNORMAL HIGH (ref 65–99)
POTASSIUM: 4.7 mmol/L (ref 3.5–5.1)
SODIUM: 136 mmol/L (ref 135–145)

## 2016-05-09 LAB — HEMOGLOBIN AND HEMATOCRIT, BLOOD
HEMATOCRIT: 41.1 % (ref 39.0–52.0)
HEMOGLOBIN: 14.2 g/dL (ref 13.0–17.0)

## 2016-05-09 LAB — CREATININE, FLUID (PLEURAL, PERITONEAL, JP DRAINAGE): Creat, Fluid: 1.4 mg/dL

## 2016-05-09 MED ORDER — OXYCODONE HCL 5 MG PO TABS
5.0000 mg | ORAL_TABLET | ORAL | Status: DC | PRN
Start: 2016-05-09 — End: 2016-05-09
  Administered 2016-05-09: 5 mg via ORAL
  Filled 2016-05-09: qty 1

## 2016-05-09 MED ORDER — OXYCODONE HCL 5 MG PO TABS: 10.0000 mg | ORAL_TABLET | ORAL | Status: DC | PRN

## 2016-05-09 NOTE — Op Note (Signed)
[name], CONTRERAZ NO.:  [id number]  MEDICAL RECORD NO.:  [id number]  LOCATION:                                FACILITY:  [facility]  PHYSICIAN:  Jacob Frock, MD     DATE OF BIRTH:  [birth year]  DATE OF PROCEDURE: 05/08/2016                               OPERATIVE REPORT   PREOPERATIVE DIAGNOSIS:  Enlarging solid right renal mass.  PROCEDURES:  Robotic-assisted laparoscopic right partial nephrectomy, retroperitoneal type.  ASSISTANT:  Azucena Fallen, NP.  ESTIMATED BLOOD LOSS:  50 mL.  COMPLICATION:  None.  SPECIMENS:  Right renal mass (disrupted at extraction for permanent pathology).  FINDINGS: 1. Two arteries, 1 vein, right renovascular anatomy as anticipated. 2. Mostly exophytic right lower pole solid renal mass. 3. Disruption of mass with extraction.  This was intact within the     endoscopic retrieval bag prior to extraction. 4. Warm ischemia time 17 minutes.  INDICATIONS:  Jacob Marks is a pleasant 56 year old gentleman with complex medical history including history of partial colectomy with temporary colostomy that was later taken down with a complex abdominal hernia repair, status post large mass repair.  He was found to have a solid right renal mass, initially placed on surveillance.  This was clearly increasing in size.  He is also a stone former.  Options were discussed for management including surveillance protocols versus ablative therapy versus surgical extirpation with or without nephron-sparing and the patient adamantly wished to undergo a partial nephrectomy.  He had opinion from multiple urologists at multiple institutions and it was felt that a retroperitoneal approach would be most advantageous and he was referred for consideration of this.  The patient's axial imaging was reviewed and was felt to be a suitable candidate for this approach and he wished to proceed.  Informed consent was obtained and placed in the medical record.  PROCEDURE  IN DETAIL:  The patient being [facility] verified.  Procedure being right retroperitoneal partial nephrectomy was confirmed. Procedure was performed.  Time-out was performed.  Intravenous antibiotics were administered.  General endotracheal anesthesia introduced.  Patient was placed into a right side up full flank position implying approximately 20 degrees of stable flexion, superior arm elevator, axillary roll, sequential pressure devices, bottom leg bent, top leg straight.  He was positioned so that his back was essentially flushed with the edge of the operative table to maximally avoid Robotic instrument collison.  Sterile field was created by prepping and draping the patient's right flank using chlorhexidine gluconate from his midline of the spine anteriorly towards the umbilicus.  After he was further fashioned to the operating table using 3-inch tape over foam padding across his supraxiphoid chest and his pelvis.  Initial retroperitoneal access was obtained by incising approximately 2 cm inferior posterior to the tip of the 12th rib.  Again using surgeon's finger dissecting the subcutaneous fat to level of lumbodorsal fascia, musculature, and piercing into the retroperitoneal space bluntly.  This space was further developed using surgeon's finger using landmarks including the psoas muscle inferiorly in the inferior pole of the kidney, superior medially and the kidney was very carefully swept anteriorly and then tissue swept away  from the anterior aspect of the retroperitoneal space to allow placement of the Spacemaker balloon.  The Spacemaker balloon was then very carefully positioned in a retro-renal fashion into this space and under vision inflated to a pressure of 30 pumps, then released.  This had initial development of the retroperitoneal space. Again using very careful guidance with surgeon's finger, ports were placed as follows.  Right posterior 8 mm assist port, this was  directly onto the surface of the psoas musculature in the angle of the 12th rib, inferior posterior 12 mm assist port, this was approximately 2-1/2 fingerbreadths inferior posterior to the camera to the initial access point this was placed, just above the iliac crest.  Right robotic port approximately 3 fingerbreadths medial to the initial access point and another robotic port approximately 5 fingerbreadths medial taking exquisite care to avoid any injury to the peritoneal cavity which was verified with very careful palpation through the initial access site. The Spacemaker balloon port apparatus was carefully positioned, inflated to 20 mL of air, deployed and using a piggyback technique, an 8-mm robotic camera port placed through this.  Pneumoperitoneum was achieved. Robot was docked and passed through the electronic checks.  Initial attention was directed at identification of the hilum.  The psoas musculature was used as a floor of dissection and dissection proceeded medially across the psoas towards the area of the great vessels.  The inferior vena cava was identified and traced superiorly.  The area of the renal hilum was verified by pulsations heading towards the area of the kidney.  Hilum consisted of 2 arteries at this level as anticipated. These were marked with vessel loop.  There was a single dominant renal vein.  After having marked the 2 right renal arteries, the kidney was purposely dropped away from the anterior surface of retroperitoneal space directly onto the anterior aspect of the kidney dissection was focused.  The mass in question was easily identified as it was quite exophytic and lateral.  The edges of these were defatted allowing visualization of the interface between the mass and parenchyma approximately 6-7 mm circumferentially.  It was not felt intraoperative ultrasound would be warranted given the exophytic nature, the edges with planned partial nephrectomy were  scored using cautery current. Warm ischemia was achieved by placing a small bulldog clamp on each artery individually and partial nephrectomy was carefully performed, using probably cold scissors keeping what appeared to be a rim of normal parenchyma with the partial nephrectomy specimen, the collecting system was entered.  This was felt to be a good marker of depth and the specimen was verified for later retrieval.  Initial rhenorraphy was performed using running 3-0 V-lock over-sewing the visualized entrance into system, resulted in excellent apposition of the structure.  Next, a Surgicel bolster was applied in a second parenchymal apposition layer of interrupted Vicryl.  Skin between Hem-o-Loks and lapper ties were performed x2.  The bulldog clamps were taken down for a total warm ischemia for total time of 17 minutes.  The area of rhenorraphy appeared to be completely hemostatic and without extravasation of urine, perirenal fat was reapproximated back over this using another layer of 3- 0 Vicryl.  After Surgiflo was applied to the prior partial bed, vessel loops were removed.  All sponge and needle counts were correct.  The partial nephrectomy specimen was placed into an EndoCatch bag, completely intact.  A closed suction drain was brought through the port site just medial to the camera port site.  Robot  was then undocked.  Specimen was retrieved via the camera port sites, removing and setting aside for permanent pathology.  Both the camera port site and the assist port site were closed at the level of the fascia using figure-of-eight PDS. All incision sites were infiltrated with dilute lyophilized Marcaine and closed at the level of the skin using subcuticular Monocryl followed by Dermabond and procedure terminated.  The patient tolerated the procedure well.  There were no immediate periprocedural complications.  The patient was taken to the postanesthesia care in stable  condition. Inspection on the back table of the renal mass after removing from the retrieval bag did reveal some disruption with obvious tumor material having been disrupted.  The area of the interface of the kidney appeared to be obvious grossly and disruption was noted on pathology requisition.  Please note, surigcal first assistant Azucena Fallen was crucial for all portions of the robotic procedure including  retraction, suctioning, placement of vascular clamps, suture passage without which this approach would not be possible.    ______________________________ Jacob Frock, MD   ______________________________ Jacob Frock, MD    TM/MEDQ  D:  05/08/2016  T:  05/09/2016  Job:  [id number]

## 2016-05-09 NOTE — Progress Notes (Signed)
1 Day Post-Op  Subjective:  1 - Right Renal Mass - s/p RIGHT robotic retroperitoneal partial nephrectomy 1/19, the day of admission. POD 1 Hgb 14.2, Cr 1.2, JP 100 overnight.   Today "Jacob Marks" is stable. Resolving Rt sided subQ including periorbital crepitus that is non-painful. JP output low.   Objective: Vital signs in last 24 hours: Temp:  [97.6 F (36.4 C)-98.7 F (37.1 C)] 97.9 F (36.6 C) (01/20 0617) Pulse Rate:  [58-105] 82 (01/20 0617) Resp:  [14-18] 18 (01/20 0617) BP: (114-160)/(55-87) 117/69 (01/20 0617) SpO2:  [94 %-100 %] 95 % (01/20 0617) Weight:  [114.8 kg (253 lb)] 114.8 kg (253 lb) (01/19 0936) Last BM Date: 05/07/16  Intake/Output from previous day: 01/19 0701 - 01/20 0700 In: 3678.3 [P.O.:720; I.V.:2958.3] Out: 775 [Urine:625; Drains:100; Blood:50] Intake/Output this shift: No intake/output data recorded.  General appearance: alert, cooperative, appears stated age and family at bedside Eyes: Rt eye periorbaital empysema approx 60% resolved over past 18 hours. No bruising. Deneis field defects.  Nose: Nares normal. Septum midline. Mucosa normal. No drainage or sinus tenderness. Throat: lips, mucosa, and tongue normal; teeth and gums normal Neck: supple, symmetrical, trachea midline Back: symmetric, no curvature. ROM normal. No CVA tenderness. Resp: non-labored on room air.  Cardio: Nl rate GI: soft, non-tender; bowel sounds normal; no masses,  no organomegaly and stable moderate truncal obesity with numerous scars w/o hernias.  Male genitalia: normal, foley removed.  Extremities: extremities normal, atraumatic, no cyanosis or edema Pulses: 2+ and symmetric Skin: Skin color, texture, turgor normal. No rashes or lesions Lymph nodes: Cervical, supraclavicular, and axillary nodes normal. Incision/Wound: Rt flank port sites c/d/i. JP with small volume serosanguinous output that is non-foul.   Lab Results:   Recent Labs  05/08/16 1603 05/09/16 0546  HGB  15.7 14.2  HCT 45.0 41.1   BMET  Recent Labs  05/09/16 0546  NA 136  K 4.7  CL 104  CO2 26  GLUCOSE 154*  BUN 22*  CREATININE 1.20  CALCIUM 8.6*   PT/INR No results for input(s): LABPROT, INR in the last 72 hours. ABG No results for input(s): PHART, HCO3 in the last 72 hours.  Invalid input(s): PCO2, PO2  Studies/Results: No results found.  Anti-infectives: Anti-infectives    Start     Dose/Rate Route Frequency Ordered Stop   05/08/16 0915  ceFAZolin (ANCEF) IVPB 2g/100 mL premix  Status:  Discontinued     2 g 200 mL/hr over 30 Minutes Intravenous 30 min pre-op 05/08/16 0915 05/08/16 1632      Assessment/Plan:  1 - Right Renal Mass - doing well POD 1. Saline lock, ambulate, verify JP output remains low / scant now that foley out. Discussed goals for discharge, will likely be ready today PM v. Tomorrow AM based on current progress.   [facility], Jacob Marks 05/09/2016

## 2016-05-09 NOTE — Op Note (Deleted)
  The note originally documented on this encounter has been moved the the encounter in which it belongs.  

## 2016-06-12 ENCOUNTER — Encounter: Payer: Self-pay | Admitting: Primary Care

## 2016-06-12 ENCOUNTER — Ambulatory Visit (INDEPENDENT_AMBULATORY_CARE_PROVIDER_SITE_OTHER): Payer: BC Managed Care – PPO | Admitting: Primary Care

## 2016-06-12 VITALS — BP 134/86 | HR 70 | Temp 98.0°F | Ht 69.0 in | Wt 256.0 lb

## 2016-06-12 DIAGNOSIS — G4700 Insomnia, unspecified: Secondary | ICD-10-CM | POA: Insufficient documentation

## 2016-06-12 MED ORDER — TRAZODONE HCL 50 MG PO TABS: 25.0000 mg | ORAL_TABLET | Freq: Every evening | ORAL | 0 refills | Status: DC | PRN

## 2016-06-12 NOTE — Assessment & Plan Note (Signed)
Suspect this to be causing headaches. Symptoms representative. Start Zyrtec HS, Flonase PRN. He does not appear acutely ill, lungs clear, normal exam otherwise.

## 2016-06-12 NOTE — Patient Instructions (Signed)
Avoid watching TV/looking at a computer/looking at a phone before bedtime.  Do not drink caffeine after 12 pm daily.  Start exercising. You should be getting 150 minutes of moderate intensity exercise weekly.  Do not lay awake in bed if you cannot sleep. Try reading a book until you feel tired.  You may try Trazodone 50 mg tablets for difficulty sleeping. Take 1/2 to 1 tablet by mouth at bedtime as needed.  Your headache is likely due to allergies from weather changes.  Nasal Congestion/Ear Pressure: Try using Flonase (fluticasone) nasal spray. Instill 1 spray in each nostril twice daily.   Start Zyrtec tablets every night at bedtime for head pressure, runny nose, drainage to your throat.  Please follow up with Dr. Glori Bickers if no improvement. It was a pleasure meeting you!

## 2016-06-12 NOTE — Progress Notes (Signed)
Subjective:    Patient ID: Jacob Marks, male    DOB: 14-May-1960, 55 y.o.   MRN: [id number]  HPI  Jacob Marks is a 56 year old male with a history of sleep apnea, recent right retroperitoneal partial nephrectomy on 05/08/16, CAD, HTN, allergic rhinitis who presents today with multiple complaints.  1) Insomnia: He does have a history of insomnia in the past but became more bothersome since his surgery on 05/08/16. He goes to bed at 11 pm and will lay in bed for hours, wakes several times during the night, then gets up around 6-7 am. His mind does not race, he denies anxiety, nervousness irritability, worry, and depression symptoms. He does drink coffee and sweet tea with his last dose of caffeine being in the morning.  He's eating dinner at 7 pm. He does not routinely exercise. He stopped wearing his CPAP 15 years ago as it caused discomfort. He's taken Melatonin, Nyquil, Tylenol PM, and Sleep Ease without improvement or cause him to feel drowsy the next day.   2) Headache: Also with rhinorrhea, dry cough that is worse at night, post nasal drip, body aches. These symptoms have been present for the past three weeks. He's tried Advil Cold and Sinus without improvement. He denies fevers, photophobia, nasal congestion, nausea. His headache is located to the parietal lobes. He's not blowing anything from his nose.    Review of Systems  Constitutional: Negative for chills, fatigue and fever.  HENT: Positive for postnasal drip and rhinorrhea. Negative for congestion, sinus pressure and sore throat.   Respiratory: Positive for cough. Negative for shortness of breath.   Cardiovascular: Negative for chest pain.  Psychiatric/Behavioral: Positive for sleep disturbance. The patient is not nervous/anxious.        Past Medical History:  Diagnosis Date  . Childhood asthma   . Chronic kidney disease    renal cyst right  . Coronary artery disease   . CVA (cerebral infarction) 10/2010   after his  hemicolectomy; "some paralysis right side of mouth and slight speech problem as a result" (05/07/2014)  . Diverticulosis 7/12   with hemicolectomy-- complications   . ED (erectile dysfunction)   . Heart murmur    as a child  . History of kidney cancer    lesion on the right kidney  . History of kidney stones   . Hyperlipidemia   . Hypertension   . Kidney stones   . OSA (obstructive sleep apnea)    "has CPAP; doesn't wear it" (05/07/2014)  . Pes planus   . STEMI (ST elevation myocardial infarction) ([place]) 07/2012  . Stroke ([place])    slight right sided weakness - in face   . Testosterone deficiency      Social History   Social History  . Marital status: Married    Spouse name: N/A  . Number of children: N/A  . Years of education: N/A   Occupational History  . Not on file.   Social History Main Topics  . Smoking status: Former Smoker    Packs/day: 1.00    Years: 2.00    Types: Cigarettes    Quit date: 04/20/1978  . Smokeless tobacco: Never Used  . Alcohol use 0.0 oz/week     Comment: 05/07/2014 "maybe a beer twice/month"  . Drug use: No  . Sexual activity: Yes   Other Topics Concern  . Not on file   Social History Narrative  . No narrative on file    Past Surgical  History:  Procedure Laterality Date  . ABDOMINAL EXPLORATION SURGERY  05/07/2014   w/LO;&  Repair multiple, incarcerated incisional hernias with mesh  . CARDIAC CATHETERIZATION    . carotid dopplers  8/12   normal - after cva   . COLON SURGERY    . CORONARY ANGIOPLASTY WITH STENT PLACEMENT  07/2012   to the distal RCA  . EXTRACORPOREAL SHOCK WAVE LITHOTRIPSY  2014  . HEMICOLECTOMY  10/2010   for diverticulosis, complic by leaking anastamosis/ abcess/ addn surg and iliostomy   . HERNIA REPAIR  05/07/2014  . ILEOSTOMY  10/2010  . ILEOSTOMY CLOSURE  06/2011  . INCISIONAL HERNIA REPAIR N/A 05/07/2014   Procedure: REPAIR RECURRENT INCISIONAL HERNIA;  Surgeon: Fanny Skates, MD;  Location: [place];  Service:  General;  Laterality: N/A;  . INSERTION OF MESH N/A 05/07/2014   Procedure: INSERTION OF MESH;  Surgeon: Fanny Skates, MD;  Location: [place];  Service: General;  Laterality: N/A;  . ROBOTIC ASSITED PARTIAL NEPHRECTOMY Right 05/08/2016   Procedure: ROBOTIC ASSITED RETROPERITONEAL PARTIAL NEPHRECTOMY;  Surgeon: Alexis Frock, MD;  Location: [facility];  Service: Urology;  Laterality: Right;  . TESTICLE SURGERY     as a child; "they didn't descend"  . TONSILLECTOMY AND ADENOIDECTOMY     as a child  . VASECTOMY      Family History  Problem Relation Age of Onset  . Heart disease Mother 65    MI  . Hypertension Mother   . Alcohol abuse Mother   . Heart disease Father   . Hypertension Father   . Cancer Sister     colon    Allergies  Allergen Reactions  . Testosterone Rash    Rash from the patch    Current Outpatient Prescriptions on File Prior to Visit  Medication Sig Dispense Refill  . atorvastatin (LIPITOR) 40 MG tablet Take 1 tablet (40 mg total) by mouth daily. (Patient taking differently: Take 40 mg by mouth every evening. ) 90 tablet 3  . lisinopril (PRINIVIL,ZESTRIL) 10 MG tablet TAKE ONE TABLET BY MOUTH ONCE DAILY (Patient taking differently: Take 10 mg by mouth daily. TAKE ONE TABLET BY MOUTH ONCE DAILY) 90 tablet 3  . metoprolol tartrate (LOPRESSOR) 25 MG tablet TAKE ONE TABLET BY MOUTH TWICE DAILY *PLEASE KEEP UPCOMING APPOINTMENT* 180 tablet 3  . oxyCODONE-acetaminophen (ROXICET) 5-325 MG tablet Take 1-2 tablets by mouth every 4 (four) hours as needed. 30 tablet 0  . nitroGLYCERIN (NITROSTAT) 0.4 MG SL tablet Place 1 tablet (0.4 mg total) under the tongue every 5 (five) minutes as needed for chest pain. (Patient not taking: Reported on 06/12/2016) 25 tablet 3   No current facility-administered medications on file prior to visit.     BP 134/86   Pulse 70   Temp 98 F (36.7 C) (Oral)   Ht 5\' 9"  (1.753 m)   Wt 256 lb (116.1 kg)   SpO2 98%   BMI 37.80 kg/m     Objective:   Physical Exam  Constitutional: He appears well-nourished. He does not appear ill.  HENT:  Right Ear: Tympanic membrane and ear canal normal.  Left Ear: Tympanic membrane and ear canal normal.  Nose: No mucosal edema. Right sinus exhibits no maxillary sinus tenderness and no frontal sinus tenderness. Left sinus exhibits no maxillary sinus tenderness and no frontal sinus tenderness.  Mouth/Throat: Oropharynx is clear and moist.  Eyes: Conjunctivae are normal.  Neck: Neck supple.  Cardiovascular: Normal rate and regular rhythm.   Pulmonary/Chest:  Effort normal and breath sounds normal. He has no wheezes. He has no rales.  Skin: Skin is warm and dry.          Assessment & Plan:

## 2016-06-12 NOTE — Assessment & Plan Note (Signed)
Has not used CPAP in years. Consider sleep study given history, but also due to insomnia. Doesn't seem to be main cause for insomnia as described today, but should keep in mind.

## 2016-06-12 NOTE — Progress Notes (Signed)
Pre visit review using our clinic review tool, if applicable. No additional management support is needed unless otherwise documented below in the visit note. 

## 2016-06-12 NOTE — Assessment & Plan Note (Signed)
Prior history of, worse since surgery on 05/08/16. Doesn't seem to be anxiety or depression but would consider if no improvement with treatment regimen today. Doesn't seem to be OSA, but do recommend repeat sleep study at some point, no pain from recent surgery. Unclear etiology, but since he's failed OTC, will trial low dose Trazodone and follow up with PCP if no improvement.

## 2016-06-15 ENCOUNTER — Ambulatory Visit: Payer: BC Managed Care – PPO | Admitting: Family Medicine

## 2016-06-22 ENCOUNTER — Encounter: Payer: Self-pay | Admitting: Cardiovascular Disease

## 2016-06-22 ENCOUNTER — Ambulatory Visit (INDEPENDENT_AMBULATORY_CARE_PROVIDER_SITE_OTHER): Payer: BC Managed Care – PPO | Admitting: Cardiovascular Disease

## 2016-06-22 VITALS — BP 130/80 | HR 58 | Ht 68.0 in | Wt 258.5 lb

## 2016-06-22 DIAGNOSIS — I2510 Atherosclerotic heart disease of native coronary artery without angina pectoris: Secondary | ICD-10-CM

## 2016-06-22 DIAGNOSIS — I10 Essential (primary) hypertension: Secondary | ICD-10-CM | POA: Diagnosis not present

## 2016-06-22 DIAGNOSIS — I213 ST elevation (STEMI) myocardial infarction of unspecified site: Secondary | ICD-10-CM

## 2016-06-22 DIAGNOSIS — R079 Chest pain, unspecified: Secondary | ICD-10-CM | POA: Diagnosis not present

## 2016-06-22 DIAGNOSIS — E785 Hyperlipidemia, unspecified: Secondary | ICD-10-CM

## 2016-06-22 NOTE — Progress Notes (Signed)
Cardiology Office Note  Date:  06/22/2016   ID:  GARLON KERBO, DOB 10-11-1960, MRN [id number]  PCP:  Loura Pardon, MD   Chief Complaint  Patient presents with  . other    12 month follow up. Meds reviewed by the pt. verbally. "doing well."     HPI:  Mr. Hottinger is a pleasant 56 year old gentleman who installs air conditioning units at [employer] who presented to the hospital August 05 2012 with STEMI, occluded distal RCA. He had significant thrombus. DES was placed to the distal RCA, aspiration thrombectomy with large thrombus revealed. Started on aspirin and brilinta. No other significant disease noted. He presents today for routine follow-up of his coronary artery disease  He reports having remote history of ABD surgery Colon resection 2013 Followed by Hernia surgery 2013  Recently found mass in kidney, cancer, aggressive nature Had surgery, good margins 05/08/2016 "got it all" 20 pounds weight loss, Glu <100 for the first time in several years  Total chol 125, LDL 52 In 2017  Sedentary at home Active 60% of the time at work Having some sinus congestion recently started on allergy medicine by primary care  denies any symptoms concerning for angina Taking aspirin, Lipitor, lisinopril, metoprolol Weight continues to be a problem. No regular exercise program  Previous Hemoglobin A1c 5.5  EKG on today's visit shows sinus bradycardia rate 58 bpm no significant ST or T-wave changes  Past medical history Echocardiogram in the hospital was essentially normal with normal ejection fraction estimated at greater than 55%, normal right ventricular systolic pressure, no significant wall motion abnormality.    PMH:   has a past medical history of Childhood asthma; Chronic kidney disease; Coronary artery disease; CVA (cerebral infarction) (10/2010); Diverticulosis (7/12); ED (erectile dysfunction); Heart murmur; History of kidney cancer; History of kidney stones; Hyperlipidemia; Hypertension;  Kidney stones; OSA (obstructive sleep apnea); Pes planus; STEMI (ST elevation myocardial infarction) ([place]) (07/2012); Stroke [facility]); and Testosterone deficiency.  PSH:    Past Surgical History:  Procedure Laterality Date  . ABDOMINAL EXPLORATION SURGERY  05/07/2014   w/LO;&  Repair multiple, incarcerated incisional hernias with mesh  . CARDIAC CATHETERIZATION    . carotid dopplers  8/12   normal - after cva   . COLON SURGERY    . CORONARY ANGIOPLASTY WITH STENT PLACEMENT  07/2012   to the distal RCA  . EXTRACORPOREAL SHOCK WAVE LITHOTRIPSY  2014  . HEMICOLECTOMY  10/2010   for diverticulosis, complic by leaking anastamosis/ abcess/ addn surg and iliostomy   . HERNIA REPAIR  05/07/2014  . ILEOSTOMY  10/2010  . ILEOSTOMY CLOSURE  06/2011  . INCISIONAL HERNIA REPAIR N/A 05/07/2014   Procedure: REPAIR RECURRENT INCISIONAL HERNIA;  Surgeon: Fanny Skates, MD;  Location: [place];  Service: General;  Laterality: N/A;  . INSERTION OF MESH N/A 05/07/2014   Procedure: INSERTION OF MESH;  Surgeon: Fanny Skates, MD;  Location: [place];  Service: General;  Laterality: N/A;  . ROBOTIC ASSITED PARTIAL NEPHRECTOMY Right 05/08/2016   Procedure: ROBOTIC ASSITED RETROPERITONEAL PARTIAL NEPHRECTOMY;  Surgeon: Alexis Frock, MD;  Location[facility];  Service: Urology;  Laterality: Right;  . TESTICLE SURGERY     as a child; "they didn't descend"  . TONSILLECTOMY AND ADENOIDECTOMY     as a child  . VASECTOMY      Current Outpatient Prescriptions  Medication Sig Dispense Refill  . aspirin EC 81 MG tablet Take 81 mg by mouth daily.    Marland Kitchen atorvastatin (LIPITOR) 40 MG  tablet Take 1 tablet (40 mg total) by mouth daily. (Patient taking differently: Take 40 mg by mouth every evening. ) 90 tablet 3  . cetirizine (ZYRTEC) 10 MG tablet Take 10 mg by mouth daily.    . fluticasone (VERAMYST) 27.5 MCG/SPRAY nasal spray Place 2 sprays into the nose daily.    Marland Kitchen lisinopril (PRINIVIL,ZESTRIL) 10 MG tablet TAKE ONE TABLET BY MOUTH  ONCE DAILY (Patient taking differently: Take 10 mg by mouth daily. TAKE ONE TABLET BY MOUTH ONCE DAILY) 90 tablet 3  . metoprolol tartrate (LOPRESSOR) 25 MG tablet TAKE ONE TABLET BY MOUTH TWICE DAILY *PLEASE KEEP UPCOMING APPOINTMENT* 180 tablet 3  . nitroGLYCERIN (NITROSTAT) 0.4 MG SL tablet Place 1 tablet (0.4 mg total) under the tongue every 5 (five) minutes as needed for chest pain. 25 tablet 3  . traZODone (DESYREL) 50 MG tablet Take 0.5-1 tablets (25-50 mg total) by mouth at bedtime as needed for sleep. 30 tablet 0   No current facility-administered medications for this visit.      Allergies:   Testosterone   Social History:  The patient  reports that he quit smoking about 38 years ago. His smoking use included Cigarettes. He has a 2.00 pack-year smoking history. He has never used smokeless tobacco. He reports that he drinks alcohol. He reports that he does not use drugs.   Family History:   family history includes Alcohol abuse in his mother; Cancer in his sister; Heart disease in his father; Heart disease (age of onset: 19) in his mother; Hypertension in his father and mother.    Review of Systems: Review of Systems  Constitutional: Negative.   HENT: Positive for congestion.   Respiratory: Negative.   Cardiovascular: Negative.   Gastrointestinal: Negative.   Musculoskeletal: Negative.   Neurological: Negative.   Psychiatric/Behavioral: Negative.   All other systems reviewed and are negative.    PHYSICAL EXAM: VS:  BP 130/80 (BP Location: Left Arm, Patient Position: Sitting, Cuff Size: Normal)   Pulse (!) 58   Ht 5\' 8"  (1.727 m)   Wt 258 lb 8 oz (117.3 kg)   BMI 39.30 kg/m  , BMI Body mass index is 39.3 kg/m. GEN: Well nourished, well developed, in no acute distress  HEENT: normal  Neck: no JVD, carotid bruits, or masses Cardiac: RRR; no murmurs, rubs, or gallops,no edema  Respiratory:  clear to auscultation bilaterally, normal work of breathing GI: soft, nontender,  nondistended, + BS MS: no deformity or atrophy  Skin: warm and dry, no rash Neuro:  Strength and sensation are intact Psych: euthymic mood, full affect    Recent Labs: 05/05/2016: Platelets 151 05/09/2016: BUN 22; Creatinine, Ser 1.20; Hemoglobin 14.2; Potassium 4.7; Sodium 136    Lipid Panel Lab Results  Component Value Date   CHOL 125 04/30/2015   HDL 51 04/30/2015   LDLCALC 52 04/30/2015   TRIG 110 04/30/2015      Wt Readings from Last 3 Encounters:  06/22/16 258 lb 8 oz (117.3 kg)  06/12/16 256 lb (116.1 kg)  05/08/16 253 lb (114.8 kg)       ASSESSMENT AND PLAN:  Hyperlipidemia, unspecified hyperlipidemia type Cholesterol is at goal on the current lipid regimen. No changes to the medications were made.  HYPERTENSION, CONTROLLED Blood pressure is well controlled on today's visit. No changes made to the medications.  ST elevation myocardial infarction (STEMI), unspecified artery ([place]) Discussed previous history, stent to his distal RCA Now on aspirin alone  Coronary artery disease involving  native coronary artery of native heart without angina pectoris Currently with no symptoms of angina. No further workup at this time. Continue current medication regimen.  Chest pain, unspecified type Denies any symptoms concerning for angina  Disposition:   F/U  12 months   Total encounter time more than 15 minutes  Greater than 50% was spent in counseling and coordination of care with the patient   Signed, Esmond Plants, M.D., Ph.D. 06/22/2016  [place], [place]

## 2016-06-22 NOTE — Patient Instructions (Signed)

## 2016-08-25 ENCOUNTER — Other Ambulatory Visit: Payer: Self-pay | Admitting: Cardiovascular Disease

## 2016-11-17 ENCOUNTER — Other Ambulatory Visit: Payer: Self-pay | Admitting: Cardiovascular Disease

## 2016-11-26 ENCOUNTER — Ambulatory Visit (HOSPITAL_COMMUNITY)
Admission: RE | Admit: 2016-11-26 | Discharge: 2016-11-26 | Disposition: A | Discharge status: Home / Self Care | Payer: BC Managed Care – PPO | Source: Ambulatory Visit | Attending: Urology | Admitting: Urology

## 2016-11-26 ENCOUNTER — Other Ambulatory Visit: Payer: Self-pay | Admitting: Urology

## 2016-11-26 DIAGNOSIS — C641 Malignant neoplasm of right kidney, except renal pelvis: Secondary | ICD-10-CM | POA: Diagnosis not present

## 2016-12-03 ENCOUNTER — Other Ambulatory Visit: Payer: Self-pay | Admitting: Cardiovascular Disease

## 2016-12-10 NOTE — Addendum Note (Signed)
Addendum  created 12/10/16 1001 by Roberts Gaudy, MD   Sign clinical note

## 2017-05-12 ENCOUNTER — Ambulatory Visit (INDEPENDENT_AMBULATORY_CARE_PROVIDER_SITE_OTHER)
Admission: RE | Admit: 2017-05-12 | Discharge: 2017-05-12 | Disposition: A | Discharge status: Home / Self Care | Payer: BC Managed Care – PPO | Source: Ambulatory Visit | Attending: Family Medicine | Admitting: Family Medicine

## 2017-05-12 ENCOUNTER — Ambulatory Visit: Payer: BC Managed Care – PPO | Admitting: Family Medicine

## 2017-05-12 ENCOUNTER — Other Ambulatory Visit: Payer: Self-pay

## 2017-05-12 ENCOUNTER — Encounter: Payer: Self-pay | Admitting: Family Medicine

## 2017-05-12 VITALS — BP 120/60 | HR 72 | Temp 98.4°F | Ht 68.0 in | Wt 274.2 lb

## 2017-05-12 DIAGNOSIS — M25511 Pain in right shoulder: Secondary | ICD-10-CM

## 2017-05-12 DIAGNOSIS — M7551 Bursitis of right shoulder: Secondary | ICD-10-CM

## 2017-05-12 NOTE — Progress Notes (Signed)
Dr. Frederico Hamman T. Norlene Lanes, MD, [facility] [place], [address] Phone: [phone] Fax: [phone]  05/12/2017  Patient: Jacob Marks, MRN: [id number], DOB: [birth year], 57 y.o.  Primary Physician:  Tower, Wynelle Fanny, MD   Chief Complaint  Patient presents with  . Fall    month or so ago  . Shoulder Pain    Right   Subjective:   Jacob Marks is a 57 y.o. very pleasant male patient who presents with the following:  Jacob Marks on deck about a month ago. Maybe a week later it will pop and hurt.  Very pleasant gentleman, and he fell on his deck at home and landed on the point of his shoulder about a month ago.  Since that time he has been able to maintain motion and his strength is preserved.  Does have some occasional clicking and popping when he moves his shoulder around deep in the shoulder joint.  He does have some pain in a T-shirt distribution also.  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  RHD     Past Medical History, Surgical History, Social History, Family History, Problem List, Medications, and Allergies have been reviewed and updated if relevant.  Patient Active Problem List   Diagnosis Date Noted  . Insomnia 06/12/2016  . Renal mass, right 05/08/2016  . Morbid obesity ([place]) 04/30/2015  . CAD (coronary artery disease) 05/07/2014  . Incarcerated incisional hernia 04/23/2014  . Erectile dysfunction 02/21/2014  . Allergic rhinitis 09/04/2013  . Vertigo, benign paroxysmal 09/04/2013  . STEMI (ST elevation myocardial infarction) ([place]) 08/12/2012  . Chest pain 03/21/2012  . GERD (gastroesophageal reflux disease) 03/21/2012  . Dyspnea 06/09/2011  . Post-nasal drip 06/09/2011  . Kidney stone 06/09/2011  . Anxiety 06/09/2011  . Dysphagia 03/19/2011  . Diverticulosis 01/04/2011  . Wound infection after surgery 01/04/2011  . CVA (cerebral infarction) 01/02/2011  .  Depression 01/02/2011  . Anemia 01/02/2011  . Fatigue 08/05/2010  . Prostate cancer screening 08/05/2010  . TINEA VERSICOLOR 01/15/2010  . EYE FLOATERS 01/15/2010  . ONYCHIA AND PARONYCHIA OF FINGER 11/02/2008  . 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 Testosterone deficiency 11/16/2007  . HYPERTENSION, CONTROLLED 11/16/2007  . Sleep apnea 11/16/2007  . 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 Hyperlipidemia 05/28/2003    Past Medical History:  Diagnosis Date  . Childhood asthma   . Chronic kidney disease    renal cyst right  . Coronary artery disease   . CVA (cerebral infarction) 10/2010   after his hemicolectomy; "some paralysis right side of mouth and slight speech problem as a result" (05/07/2014)  . Diverticulosis 7/12   with hemicolectomy-- complications   . ED (erectile dysfunction)   . Heart murmur    as a child  . History of kidney cancer    lesion on the right kidney  . History of kidney stones   . Hyperlipidemia   . Hypertension   . Kidney stones   . OSA (obstructive sleep apnea)    "has CPAP; doesn't wear it" (05/07/2014)  . Pes planus   . STEMI (ST elevation myocardial infarction) ([place]) 07/2012  . Stroke ([place])    slight right sided weakness - in face   . Testosterone deficiency     Past Surgical History:  Procedure Laterality Date  . ABDOMINAL EXPLORATION SURGERY  05/07/2014   w/LO;&  Repair multiple,  incarcerated incisional hernias with mesh  . CARDIAC CATHETERIZATION    . carotid dopplers  8/12   normal - after cva   . COLON SURGERY    . CORONARY ANGIOPLASTY WITH STENT PLACEMENT  07/2012   to the distal RCA  . EXTRACORPOREAL SHOCK WAVE LITHOTRIPSY  2014  . HEMICOLECTOMY  10/2010   for diverticulosis, complic by leaking anastamosis/ abcess/ addn surg and iliostomy   . HERNIA REPAIR  05/07/2014  . ILEOSTOMY  10/2010  . ILEOSTOMY CLOSURE  06/2011  . INCISIONAL HERNIA REPAIR N/A 05/07/2014   Procedure: REPAIR RECURRENT INCISIONAL HERNIA;   Surgeon: Fanny Skates, MD;  Location: [facility];  Service: General;  Laterality: N/A;  . INSERTION OF MESH N/A 05/07/2014   Procedure: INSERTION OF MESH;  Surgeon: Fanny Skates, MD;  Location: [facility];  Service: General;  Laterality: N/A;  . ROBOTIC ASSITED PARTIAL NEPHRECTOMY Right 05/08/2016   Procedure: ROBOTIC ASSITED RETROPERITONEAL PARTIAL NEPHRECTOMY;  Surgeon: Alexis Frock, MD;  [facility];  Service: Urology;  Laterality: Right;  . TESTICLE SURGERY     as a child; "they didn't descend"  . TONSILLECTOMY AND ADENOIDECTOMY     as a child  . VASECTOMY      Social History   Socioeconomic History  . Marital status: Married    Spouse name: Not on file  . Number of children: Not on file  . Years of education: Not on file  . Highest education level: Not on file  Social Needs  . Financial resource strain: Not on file  . Food insecurity - worry: Not on file  . Food insecurity - inability: Not on file  . Transportation needs - medical: Not on file  . Transportation needs - non-medical: Not on file  Occupational History  . Not on file  Tobacco Use  . Smoking status: Former Smoker    Packs/day: 1.00    Years: 2.00    Pack years: 2.00    Types: Cigarettes    Last attempt to quit: 04/20/1978    Years since quitting: 39.0  . Smokeless tobacco: Never Used  Substance and Sexual Activity  . Alcohol use: Yes    Alcohol/week: 0.0 oz    Comment: 05/07/2014 "maybe a beer twice/month"  . Drug use: No  . Sexual activity: Yes  Other Topics Concern  . Not on file  Social History Narrative  . Not on file    Family History  Problem Relation Age of Onset  . Heart disease Mother 55       MI  . Hypertension Mother   . Alcohol abuse Mother   . Heart disease Father   . Hypertension Father   . Cancer Sister        colon    Allergies  Allergen Reactions  . Testosterone Rash    Rash from the patch    Medication list reviewed and updated in full in [facility].  GEN: No  fevers, chills. Nontoxic. Primarily MSK c/o today. MSK: Detailed in the HPI GI: tolerating PO intake without difficulty Neuro: No numbness, parasthesias, or tingling associated. Otherwise the pertinent positives of the ROS are noted above.   Objective:   BP 120/60   Pulse 72   Temp 98.4 F (36.9 C) (Oral)   Ht 5\' 8"  (1.727 m)   Wt 274 lb 4 oz (124.4 kg)   BMI 41.70 kg/m    GEN: WDWN, NAD, Non-toxic, Alert & Oriented x 3 HEENT: Atraumatic, Normocephalic.  Ears  and Nose: No external deformity. EXTR: No clubbing/cyanosis/edema NEURO: Normal gait.  PSYCH: Normally interactive. Conversant. Not depressed or anxious appearing.  Calm demeanor.   Shoulder: R Inspection: No muscle wasting or winging Ecchymosis/edema: neg  AC joint, scapula, clavicle: NT Cervical spine: NT, full ROM Spurling's: neg Abduction: full, 5/5, mildly pos jobe test Flexion: full, 5/5 IR, full, lift-off: 5/5 ER at neutral: full, 5/5 AC crossover and compression: neg Neer: neg Hawkins: neg Drop Test: neg Empty Can: neg Supraspinatus insertion: NT Bicipital groove: NT Speed's: neg Yergason's: neg Sulcus sign: neg Scapular dyskinesis: none C5-T1 intact Sensation intact Grip 5/5   Radiology: Dg Shoulder Right  Result Date: 05/13/2017 CLINICAL DATA:  Fall right shoulder. EXAM: RIGHT SHOULDER - 2+ VIEW COMPARISON:  No recent. FINDINGS: No acute bony or joint abnormality identified. No evidence of fracture or dislocation. Mild acromioclavicular and glenohumeral degenerative change. IMPRESSION: No acute cardiopulmonary disease. Electronically Signed   By: Marcello Moores  Register   On: 05/13/2017 07:24     Assessment and Plan:   Acute pain of right shoulder - Plan: DG Shoulder Right  Subacromial bursitis of right shoulder joint   This far out from his initial injury, it is difficult to know exactly the pathology, but he certainly could have had a small partial cuff tear, and he also could have had an AC  joint separation based on the mechanism.  He is moving well and his strength is preserved.  I gave him some rehab work on at home, and I recommended formal physical therapy, but he is not ready to do that at this point.  He is going to follow-up in 6-8 weeks if his symptoms continue to bother him.  Follow-up: No Follow-up on file.  Orders Placed This Encounter  Procedures  . DG Shoulder Right    Signed,  Frederico Hamman T. Jaxston Chohan, MD   Allergies as of 05/12/2017      Reactions   Testosterone Rash   Rash from the patch      Medication List        Accurate as of 05/12/17 11:59 PM. Always use your most recent med list.          aspirin EC 81 MG tablet Take 81 mg by mouth daily.   atorvastatin 40 MG tablet Commonly known as:  LIPITOR TAKE ONE TABLET BY MOUTH ONCE DAILY   cetirizine 10 MG tablet Commonly known as:  ZYRTEC Take 10 mg by mouth daily.   lisinopril 10 MG tablet Commonly known as:  PRINIVIL,ZESTRIL TAKE ONE TABLET BY MOUTH ONCE DAILY   metoprolol tartrate 25 MG tablet Commonly known as:  LOPRESSOR TAKE ONE TABLET BY MOUTH TWICE DAILY *PLEASE KEEP UPCOMING APPOINTMENT*   nitroGLYCERIN 0.4 MG SL tablet Commonly known as:  NITROSTAT Place 1 tablet (0.4 mg total) under the tongue every 5 (five) minutes as needed for chest pain.   traZODone 50 MG tablet Commonly known as:  DESYREL Take 0.5-1 tablets (25-50 mg total) by mouth at bedtime as needed for sleep.

## 2017-06-05 ENCOUNTER — Ambulatory Visit (INDEPENDENT_AMBULATORY_CARE_PROVIDER_SITE_OTHER): Payer: BC Managed Care – PPO | Admitting: Family Medicine

## 2017-06-05 ENCOUNTER — Other Ambulatory Visit: Payer: Self-pay

## 2017-06-05 ENCOUNTER — Encounter: Payer: Self-pay | Admitting: Family Medicine

## 2017-06-05 VITALS — BP 122/84 | HR 64 | Temp 98.1°F | Resp 16 | Wt 271.0 lb

## 2017-06-05 DIAGNOSIS — J0100 Acute maxillary sinusitis, unspecified: Secondary | ICD-10-CM

## 2017-06-05 MED ORDER — AMOXICILLIN 500 MG PO CAPS: 500.0000 mg | ORAL_CAPSULE | Freq: Two times a day (BID) | ORAL | 0 refills | Status: AC

## 2017-06-05 MED ORDER — FLUTICASONE PROPIONATE 50 MCG/ACT NA SUSP: 1.0000 | Freq: Every day | NASAL | 0 refills | Status: DC

## 2017-06-05 NOTE — Progress Notes (Signed)
Subjective:     Jacob Marks is a 57 y.o. male who presents for evaluation of symptoms of a URI, possible sinusitis. Symptoms include bilateral ear pressure/pain, cough described as at times productive, facial pain and headache. Onset of symptoms was 1 week ago, and has been unchanged since that time. Treatment to date: Mucinex, Advil Cold and sinus, Tylenol..  The following portions of the patient's history were reviewed and updated as appropriate: allergies, current medications, past family history, past medical history, past social history, past surgical history and problem list.  Review of Systems A comprehensive review of systems was negative.   Objective:    There were no vitals taken for this visit. General appearance: alert, cooperative and no distress Head: Normocephalic, without obvious abnormality, atraumatic Eyes: conjunctivae/corneas clear. PERRL, EOM's intact. Fundi benign. Ears: normal TM's and external ear canals both ears and B/l TMs full Nose: Nares normal. Septum midline. Mucosa normal. No drainage or sinus tenderness., clear drainage Throat: lips, mucosa, and tongue normal; teeth and gums normal and Pharynx with post nasal drainage and mild erythema, no exudate Lungs: clear to auscultation bilaterally Heart: regular rate and rhythm, S1, S2 normal, no murmur, click, rub or gallop Abdomen: soft, non-tender; bowel sounds normal; no masses,  no organomegaly   Assessment:    sinusitis   Plan:    Discussed the diagnosis and treatment of sinusitis. Suggested symptomatic OTC remedies. Nasal saline spray for congestion. Amoxicillin per orders. Ok to continue Mucinex. Follow up as needed.     Grier Mitts, MD

## 2017-06-05 NOTE — Patient Instructions (Signed)

## 2017-06-25 ENCOUNTER — Other Ambulatory Visit: Payer: Self-pay | Admitting: Orthopedic Surgery

## 2017-06-25 DIAGNOSIS — M170 Bilateral primary osteoarthritis of knee: Secondary | ICD-10-CM

## 2017-07-05 NOTE — Progress Notes (Signed)
Cardiology Office Note  Date:  07/07/2017   ID:  Jacob Marks, DOB [birth year], MRN [id number]  PCP:  Abner Greenspan, MD   Chief Complaint  Patient presents with  . other    12 month follow up. Pt. needs a cardiac clearance for right knee replacement. Meds reviewed by the pt.'s bottles.     HPI:  Jacob Marks is a pleasant 57 year old gentleman who installs air conditioning units at [employer]  Hx of CAD  presented to the hospital August 05 2012 with STEMI, occluded distal RCA. He had significant thrombus. DES was placed to the distal RCA, aspiration thrombectomy with large thrombus revealed. Started on aspirin and brilinta. No other significant disease noted. He presents today for routine follow-up of his coronary artery disease  Planning to have TKR on the right Plan is to have CT scan this Friday [name] to perform the surgery  Weight up 14 pounds over the past year Eating the wrong foods   remote history of ABD surgery Colon resection 2013 Followed by Hernia surgery 2013 Surgical resection of kidney  05/08/2016  found mass in kidney, cancer, aggressive nature  2017 lab work Total chol 125, LDL 52  Sedentary at home denies any symptoms concerning for angina No regular exercise program  EKG personally reviewed by myself on todays visit Normal sinus rhythm rate 69 bpm no significant ST or T wave changesShows   Past medical history Echocardiogram in the hospital was essentially normal with normal ejection fraction estimated at greater than 55%, normal right ventricular systolic pressure, no significant wall motion abnormality.    PMH:   has a past medical history of Childhood asthma, Chronic kidney disease, Coronary artery disease, CVA (cerebral infarction) (10/2010), Diverticulosis (7/12), ED (erectile dysfunction), Heart murmur, History of kidney cancer, History of kidney stones, Hyperlipidemia, Hypertension, Kidney stones, OSA (obstructive sleep apnea), Pes planus, STEMI (ST  elevation myocardial infarction) ([place]) (07/2012), Stroke ([place]), and Testosterone deficiency.  PSH:    Past Surgical History:  Procedure Laterality Date  . ABDOMINAL EXPLORATION SURGERY  05/07/2014   w/LO;&  Repair multiple, incarcerated incisional hernias with mesh  . CARDIAC CATHETERIZATION    . carotid dopplers  8/12   normal - after cva   . COLON SURGERY    . CORONARY ANGIOPLASTY WITH STENT PLACEMENT  07/2012   to the distal RCA  . EXTRACORPOREAL SHOCK WAVE LITHOTRIPSY  2014  . HEMICOLECTOMY  10/2010   for diverticulosis, complic by leaking anastamosis/ abcess/ addn surg and iliostomy   . HERNIA REPAIR  05/07/2014  . ILEOSTOMY  10/2010  . ILEOSTOMY CLOSURE  06/2011  . INCISIONAL HERNIA REPAIR N/A 05/07/2014   Procedure: REPAIR RECURRENT INCISIONAL HERNIA;  Surgeon: Fanny Skates, MD;  Location: [facility];  Service: General;  Laterality: N/A;  . INSERTION OF MESH N/A 05/07/2014   Procedure: INSERTION OF MESH;  Surgeon: Fanny Skates, MD;  Location: [facility];  Service: General;  Laterality: N/A;  . ROBOTIC ASSITED PARTIAL NEPHRECTOMY Right 05/08/2016   Procedure: ROBOTIC ASSITED RETROPERITONEAL PARTIAL NEPHRECTOMY;  Surgeon: Alexis Frock, MD;  Location: [facility];  Service: Urology;  Laterality: Right;  . TESTICLE SURGERY     as a child; "they didn't descend"  . TONSILLECTOMY AND ADENOIDECTOMY     as a child  . VASECTOMY      Current Outpatient Medications  Medication Sig Dispense Refill  . aspirin EC 81 MG tablet Take 81 mg by mouth daily.    Marland Kitchen atorvastatin (LIPITOR) 40 MG  tablet TAKE ONE TABLET BY MOUTH ONCE DAILY 90 tablet 3  . cetirizine (ZYRTEC) 10 MG tablet Take 10 mg by mouth daily.    . Cyanocobalamin (VITAMIN B 12 PO) Take 1,000 mcg by mouth daily.    Marland Kitchen lisinopril (PRINIVIL,ZESTRIL) 10 MG tablet TAKE ONE TABLET BY MOUTH ONCE DAILY 90 tablet 3  . metoprolol tartrate (LOPRESSOR) 25 MG tablet TAKE ONE TABLET BY MOUTH TWICE DAILY *PLEASE KEEP UPCOMING APPOINTMENT* 180 tablet 3  .  nitroGLYCERIN (NITROSTAT) 0.4 MG SL tablet Place 1 tablet (0.4 mg total) under the tongue every 5 (five) minutes as needed for chest pain. 25 tablet 3  . pyridOXINE (VITAMIN B-6) 100 MG tablet Take 100 mg by mouth daily.    . traZODone (DESYREL) 50 MG tablet Take 0.5-1 tablets (25-50 mg total) by mouth at bedtime as needed for sleep. 30 tablet 0   No current facility-administered medications for this visit.      Allergies:   Testosterone   Social History:  The patient  reports that he quit smoking about 39 years ago. His smoking use included cigarettes. He has a 2.00 pack-year smoking history. he has never used smokeless tobacco. He reports that he drinks alcohol. He reports that he does not use drugs.   Family History:   family history includes Alcohol abuse in his mother; Cancer in his sister; Heart disease in his father; Heart disease (age of onset: 92) in his mother; Hypertension in his father and mother.    Review of Systems: Review of Systems  Constitutional: Negative.   HENT: Negative.   Respiratory: Negative.   Cardiovascular: Negative.   Gastrointestinal: Negative.   Musculoskeletal: Negative.   Neurological: Negative.   Psychiatric/Behavioral: Negative.   All other systems reviewed and are negative.    PHYSICAL EXAM: VS:  BP 134/90 (BP Location: Left Arm, Patient Position: Sitting, Cuff Size: Large)   Pulse 69   Ht 5\' 9"  (1.753 m)   Wt 270 lb 8 oz (122.7 kg)   BMI 39.95 kg/m  , BMI Body mass index is 39.95 kg/m. Constitutional:  oriented to person, place, and time. No distress. Obese HENT:  Head: Normocephalic and atraumatic.  Eyes:  no discharge. No scleral icterus.  Neck: Normal range of motion. Neck supple. No JVD present.  Cardiovascular: Normal rate, regular rhythm, normal heart sounds and intact distal pulses. Exam reveals no gallop and no friction rub. No edema No murmur heard. Pulmonary/Chest: Effort normal and breath sounds normal. No stridor. No  respiratory distress.  no wheezes.  no rales.  no tenderness.  Abdominal: Soft.  no distension.  no tenderness.  Musculoskeletal: Normal range of motion.  no  tenderness or deformity.  Neurological:  normal muscle tone. Coordination normal. No atrophy Skin: Skin is warm and dry. No rash noted. not diaphoretic.  Psychiatric:  normal mood and affect. behavior is normal. Thought content normal.   Recent Labs: No results found for requested labs within last 8760 hours.    Lipid Panel Lab Results  Component Value Date   CHOL 125 04/30/2015   HDL 51 04/30/2015   LDLCALC 52 04/30/2015   TRIG 110 04/30/2015      Wt Readings from Last 3 Encounters:  07/07/17 270 lb 8 oz (122.7 kg)  06/05/17 271 lb (122.9 kg)  05/12/17 274 lb 4 oz (124.4 kg)       ASSESSMENT AND PLAN:  Hyperlipidemia, unspecified hyperlipidemia type We have placed an order to have lab work done this Friday,  fasting Goal LDL less than 70  HYPERTENSION, CONTROLLED Blood pressure is well controlled on today's visit. No changes made to the medications. Stable If blood pressure does run high at home suggested he could increase the lisinopril up to 20 daily.  Weight is trending upwards  ST elevation myocardial infarction (STEMI), unspecified artery ([place])  stent to his distal RCA Now on aspirin alone Denies any anginal symptoms No further testing needed at this time  Preop cardiovascular Scheduled for total knee replacement No cardiac testing needed given no anginal symptoms Would recommend he stay on low-dose aspirin to the surgery  Coronary artery disease involving native coronary artery of native heart without angina pectoris Currently with no symptoms of angina. No further workup at this time. Continue current medication regimen.  Stable  Disposition:   F/U  12 months   Total encounter time more than 25 minutes  Greater than 50% was spent in counseling and coordination of care with the  patient   Signed, Esmond Plants, M.D., Ph.D. 07/07/2017  [place], [place]

## 2017-07-07 ENCOUNTER — Encounter: Payer: Self-pay | Admitting: Cardiovascular Disease

## 2017-07-07 ENCOUNTER — Ambulatory Visit: Payer: BC Managed Care – PPO | Admitting: Cardiovascular Disease

## 2017-07-07 VITALS — BP 134/90 | HR 69 | Ht 69.0 in | Wt 270.5 lb

## 2017-07-07 DIAGNOSIS — I10 Essential (primary) hypertension: Secondary | ICD-10-CM | POA: Diagnosis not present

## 2017-07-07 DIAGNOSIS — I213 ST elevation (STEMI) myocardial infarction of unspecified site: Secondary | ICD-10-CM

## 2017-07-07 DIAGNOSIS — I25118 Atherosclerotic heart disease of native coronary artery with other forms of angina pectoris: Secondary | ICD-10-CM

## 2017-07-07 DIAGNOSIS — E782 Mixed hyperlipidemia: Secondary | ICD-10-CM

## 2017-07-07 NOTE — Patient Instructions (Signed)
Medication Instructions:   No medication changes made  Labwork:  Lipid and LFT   fasting  At your convenience  Testing/Procedures:  No further testing at this time   Follow-Up: It was a pleasure seeing you in the office today. Please call us if you have new issues that need to be addressed before your next appt.  [phone]  Your physician wants you to follow-up in: 12 months.  You will receive a reminder letter in the mail two months in advance. If you don't receive a letter, please call our office to schedule the follow-up appointment.  If you need a refill on your cardiac medications before your next appointment, please call your pharmacy.  For educational health videos Log in to : [email or web address] Or : [email or web address], password : [email or web address]

## 2017-07-09 ENCOUNTER — Other Ambulatory Visit
Admission: RE | Admit: 2017-07-09 | Discharge: 2017-07-09 | Disposition: A | Discharge status: Home / Self Care | Payer: BC Managed Care – PPO | Source: Ambulatory Visit | Attending: Cardiovascular Disease | Admitting: Cardiovascular Disease

## 2017-07-09 ENCOUNTER — Ambulatory Visit
Admission: RE | Admit: 2017-07-09 | Discharge: 2017-07-09 | Disposition: A | Discharge status: Home / Self Care | Payer: BC Managed Care – PPO | Source: Ambulatory Visit | Attending: Orthopedic Surgery | Admitting: Orthopedic Surgery

## 2017-07-09 DIAGNOSIS — I213 ST elevation (STEMI) myocardial infarction of unspecified site: Secondary | ICD-10-CM | POA: Insufficient documentation

## 2017-07-09 DIAGNOSIS — M2341 Loose body in knee, right knee: Secondary | ICD-10-CM | POA: Diagnosis not present

## 2017-07-09 DIAGNOSIS — M170 Bilateral primary osteoarthritis of knee: Secondary | ICD-10-CM | POA: Diagnosis not present

## 2017-07-09 DIAGNOSIS — I25118 Atherosclerotic heart disease of native coronary artery with other forms of angina pectoris: Secondary | ICD-10-CM | POA: Diagnosis not present

## 2017-07-09 DIAGNOSIS — E782 Mixed hyperlipidemia: Secondary | ICD-10-CM | POA: Diagnosis present

## 2017-07-09 LAB — LIPID PANEL
CHOL/HDL RATIO: 2.5 ratio
Cholesterol: 116 mg/dL (ref 0–200)
HDL: 47 mg/dL (ref >40)
LDL CALC: 41 mg/dL (ref 0–99)
TRIGLYCERIDES: 138 mg/dL (ref <150)
VLDL: 28 mg/dL (ref 0–40)

## 2017-07-09 LAB — HEPATIC FUNCTION PANEL
ALT: 28 U/L (ref 17–63)
AST: 30 U/L (ref 15–41)
Albumin: 4 g/dL (ref 3.5–5.0)
Alkaline Phosphatase: 77 U/L (ref 38–126)
BILIRUBIN DIRECT: 0.1 mg/dL (ref 0.1–0.5)
BILIRUBIN INDIRECT: 1 mg/dL — AB (ref 0.3–0.9)
TOTAL PROTEIN: 6.8 g/dL (ref 6.5–8.1)
Total Bilirubin: 1.1 mg/dL (ref 0.3–1.2)

## 2017-07-12 ENCOUNTER — Other Ambulatory Visit: Payer: Self-pay | Admitting: *Deleted

## 2017-07-12 DIAGNOSIS — Z13220 Encounter for screening for lipoid disorders: Secondary | ICD-10-CM

## 2017-07-12 DIAGNOSIS — Z79899 Other long term (current) drug therapy: Secondary | ICD-10-CM

## 2017-08-04 ENCOUNTER — Encounter
Admission: RE | Admit: 2017-08-04 | Discharge: 2017-08-04 | Disposition: A | Discharge status: Home / Self Care | Payer: BC Managed Care – PPO | Source: Ambulatory Visit | Attending: Orthopedic Surgery | Admitting: Orthopedic Surgery

## 2017-08-04 ENCOUNTER — Other Ambulatory Visit: Payer: Self-pay

## 2017-08-04 DIAGNOSIS — Z7982 Long term (current) use of aspirin: Secondary | ICD-10-CM | POA: Insufficient documentation

## 2017-08-04 DIAGNOSIS — Z01812 Encounter for preprocedural laboratory examination: Secondary | ICD-10-CM | POA: Insufficient documentation

## 2017-08-04 DIAGNOSIS — Z79899 Other long term (current) drug therapy: Secondary | ICD-10-CM | POA: Diagnosis not present

## 2017-08-04 DIAGNOSIS — Z8673 Personal history of transient ischemic attack (TIA), and cerebral infarction without residual deficits: Secondary | ICD-10-CM | POA: Diagnosis not present

## 2017-08-04 DIAGNOSIS — I129 Hypertensive chronic kidney disease with stage 1 through stage 4 chronic kidney disease, or unspecified chronic kidney disease: Secondary | ICD-10-CM | POA: Insufficient documentation

## 2017-08-04 DIAGNOSIS — I2510 Atherosclerotic heart disease of native coronary artery without angina pectoris: Secondary | ICD-10-CM | POA: Diagnosis not present

## 2017-08-04 DIAGNOSIS — N189 Chronic kidney disease, unspecified: Secondary | ICD-10-CM | POA: Diagnosis not present

## 2017-08-04 DIAGNOSIS — Z9889 Other specified postprocedural states: Secondary | ICD-10-CM | POA: Diagnosis not present

## 2017-08-04 DIAGNOSIS — E785 Hyperlipidemia, unspecified: Secondary | ICD-10-CM | POA: Diagnosis not present

## 2017-08-04 DIAGNOSIS — G4733 Obstructive sleep apnea (adult) (pediatric): Secondary | ICD-10-CM | POA: Insufficient documentation

## 2017-08-04 HISTORY — DX: Unspecified osteoarthritis, unspecified site: M19.90

## 2017-08-04 HISTORY — DX: Malignant (primary) neoplasm, unspecified: C80.1

## 2017-08-04 LAB — URINALYSIS, ROUTINE W REFLEX MICROSCOPIC
BILIRUBIN URINE: NEGATIVE
Glucose, UA: NEGATIVE mg/dL
Hgb urine dipstick: NEGATIVE
KETONES UR: NEGATIVE mg/dL
Leukocytes, UA: NEGATIVE
NITRITE: NEGATIVE
Protein, ur: NEGATIVE mg/dL
SPECIFIC GRAVITY, URINE: 1.012 (ref 1.005–1.030)
pH: 6 (ref 5.0–8.0)

## 2017-08-04 LAB — CBC
HCT: 44.1 % (ref 40.0–52.0)
Hemoglobin: 15.4 g/dL (ref 13.0–18.0)
MCH: 30.7 pg (ref 26.0–34.0)
MCHC: 35 g/dL (ref 32.0–36.0)
MCV: 87.7 fL (ref 80.0–100.0)
Platelets: 175 10*3/uL (ref 150–440)
RBC: 5.03 MIL/uL (ref 4.40–5.90)
RDW: 14.2 % (ref 11.5–14.5)
WBC: 7.2 10*3/uL (ref 3.8–10.6)

## 2017-08-04 LAB — PROTIME-INR
INR: 0.96
PROTHROMBIN TIME: 12.7 s (ref 11.4–15.2)

## 2017-08-04 LAB — BASIC METABOLIC PANEL
Anion gap: 5 (ref 5–15)
BUN: 17 mg/dL (ref 6–20)
CHLORIDE: 107 mmol/L (ref 101–111)
CO2: 28 mmol/L (ref 22–32)
CREATININE: 1.04 mg/dL (ref 0.61–1.24)
Calcium: 8.8 mg/dL — ABNORMAL LOW (ref 8.9–10.3)
GFR calc non Af Amer: >60 mL/min (ref >60)
Glucose, Bld: 107 mg/dL — ABNORMAL HIGH (ref 65–99)
Potassium: 4.4 mmol/L (ref 3.5–5.1)
Sodium: 140 mmol/L (ref 135–145)

## 2017-08-04 LAB — TYPE AND SCREEN
ABO/RH(D): B POS
Antibody Screen: NEGATIVE

## 2017-08-04 LAB — SURGICAL PCR SCREEN
MRSA, PCR: NEGATIVE
STAPHYLOCOCCUS AUREUS: NEGATIVE

## 2017-08-04 LAB — APTT: aPTT: 26 seconds (ref 24–36)

## 2017-08-04 LAB — SEDIMENTATION RATE: Sed Rate: 6 mm/hr (ref 0–20)

## 2017-08-04 NOTE — Patient Instructions (Signed)
Your procedure is scheduled on: Tuesday, August 17, 2017 Report to Day Surgery on the 2nd floor of the [facility]. To find out your arrival time, please call [phone] between 1PM - 3PM on: Monday, August 16, 2017  REMEMBER: Instructions that are not followed completely may result in serious medical risk, up to and including death; or upon the discretion of your surgeon and anesthesiologist your surgery may need to be rescheduled.  Do not eat food after midnight the night before your procedure.  No gum chewing, lozengers or hard candies.  You may however, drink CLEAR liquids up to 2 hours before you are scheduled to arrive for your surgery. Do not drink anything within 2 hours of the start of your surgery.  Clear liquids include: - water  - apple juice without pulp - clear gatorade - black coffee or tea (Do NOT add anything to the coffee or tea) Do NOT drink anything that is not on this list.  No Alcohol for 24 hours before or after surgery.  No Smoking including e-cigarettes for 24 hours prior to surgery.  No chewable tobacco products for at least 6 hours prior to surgery.  No nicotine patches on the day of surgery.  On the morning of surgery brush your teeth with toothpaste and water, you may rinse your mouth with mouthwash if you wish. Do not swallow any toothpaste or mouthwash.  Notify your doctor if there is any change in your medical condition (cold, fever, infection).  Do not wear jewelry, make-up, hairpins, clips or nail polish.  Do not wear lotions, powders, or perfumes. You may wear deodorant.  Do not shave 48 hours prior to surgery. Men may shave face and neck.  Contacts and dentures may not be worn into surgery.  Do not bring valuables to the hospital, including drivers license, insurance or credit cards.  [facility] is not responsible for any belongings or valuables.   TAKE THESE MEDICATIONS THE MORNING OF SURGERY:  1.  metoprolol  Use CHG Soap as  directed on instruction sheet.  Follow recommendations from Cardiologist regarding stopping Aspirin. Continue up until the day of surgery.  On April 23 - Stop Anti-inflammatories (NSAIDS) such as Advil, Aleve, Ibuprofen, Motrin, Naproxen, Naprosyn and Aspirin based products such as Excedrin, Goodys Powder, BC Powder. (May take Tylenol or Acetaminophen if needed.)  On April 23 - Stop ANY OVER THE COUNTER supplements until after surgery. (May continue Vitamin B.)  Wear comfortable clothing (specific to your surgery type) to the hospital.  Plan for stool softeners for home use.  If you are being admitted to the hospital overnight, leave your suitcase in the car. After surgery it may be brought to your room.  If you are taking public transportation, you will need to have a responsible adult with you. Please confirm with your physician that it is acceptable to use public transportation.   Please call [phone] if you have any questions about these instructions.

## 2017-08-05 LAB — URINE CULTURE: Culture: NO GROWTH

## 2017-08-16 MED ORDER — DEXTROSE 5 % IV SOLN
3.0000 g | Freq: Once | INTRAVENOUS | Status: AC
  Administered 2017-08-17: 3 g via INTRAVENOUS
  Filled 2017-08-16: qty 3000

## 2017-08-17 ENCOUNTER — Encounter
Admission: RE | Disposition: A | Discharge status: Home Health | Payer: Self-pay | Source: Ambulatory Visit | Attending: Orthopedic Surgery

## 2017-08-17 ENCOUNTER — Inpatient Hospital Stay
Admission: RE | Admit: 2017-08-17 | Discharge: 2017-08-20 | DRG: 470 | Disposition: A | Discharge status: Home Health | Payer: BC Managed Care – PPO | Source: Ambulatory Visit | Attending: Family Medicine | Admitting: Family Medicine

## 2017-08-17 ENCOUNTER — Inpatient Hospital Stay: Payer: BC Managed Care – PPO | Admitting: Anesthesiology

## 2017-08-17 ENCOUNTER — Inpatient Hospital Stay: Payer: BC Managed Care – PPO

## 2017-08-17 ENCOUNTER — Other Ambulatory Visit: Payer: Self-pay

## 2017-08-17 DIAGNOSIS — Z811 Family history of alcohol abuse and dependence: Secondary | ICD-10-CM

## 2017-08-17 DIAGNOSIS — M1711 Unilateral primary osteoarthritis, right knee: Principal | ICD-10-CM | POA: Diagnosis present

## 2017-08-17 DIAGNOSIS — I2510 Atherosclerotic heart disease of native coronary artery without angina pectoris: Secondary | ICD-10-CM | POA: Diagnosis present

## 2017-08-17 DIAGNOSIS — Z955 Presence of coronary angioplasty implant and graft: Secondary | ICD-10-CM | POA: Diagnosis not present

## 2017-08-17 DIAGNOSIS — N189 Chronic kidney disease, unspecified: Secondary | ICD-10-CM | POA: Diagnosis present

## 2017-08-17 DIAGNOSIS — G8918 Other acute postprocedural pain: Secondary | ICD-10-CM

## 2017-08-17 DIAGNOSIS — K5790 Diverticulosis of intestine, part unspecified, without perforation or abscess without bleeding: Secondary | ICD-10-CM | POA: Diagnosis present

## 2017-08-17 DIAGNOSIS — K219 Gastro-esophageal reflux disease without esophagitis: Secondary | ICD-10-CM | POA: Diagnosis present

## 2017-08-17 DIAGNOSIS — Z905 Acquired absence of kidney: Secondary | ICD-10-CM

## 2017-08-17 DIAGNOSIS — Z96651 Presence of right artificial knee joint: Secondary | ICD-10-CM

## 2017-08-17 DIAGNOSIS — Z9049 Acquired absence of other specified parts of digestive tract: Secondary | ICD-10-CM

## 2017-08-17 DIAGNOSIS — Z85528 Personal history of other malignant neoplasm of kidney: Secondary | ICD-10-CM | POA: Diagnosis not present

## 2017-08-17 DIAGNOSIS — Z888 Allergy status to other drugs, medicaments and biological substances status: Secondary | ICD-10-CM | POA: Diagnosis not present

## 2017-08-17 DIAGNOSIS — I252 Old myocardial infarction: Secondary | ICD-10-CM

## 2017-08-17 DIAGNOSIS — K567 Ileus, unspecified: Secondary | ICD-10-CM | POA: Diagnosis not present

## 2017-08-17 DIAGNOSIS — I69392 Facial weakness following cerebral infarction: Secondary | ICD-10-CM | POA: Diagnosis not present

## 2017-08-17 DIAGNOSIS — I129 Hypertensive chronic kidney disease with stage 1 through stage 4 chronic kidney disease, or unspecified chronic kidney disease: Secondary | ICD-10-CM | POA: Diagnosis present

## 2017-08-17 DIAGNOSIS — Z800 Family history of malignant neoplasm of digestive organs: Secondary | ICD-10-CM

## 2017-08-17 DIAGNOSIS — E785 Hyperlipidemia, unspecified: Secondary | ICD-10-CM | POA: Diagnosis present

## 2017-08-17 DIAGNOSIS — Z87891 Personal history of nicotine dependence: Secondary | ICD-10-CM

## 2017-08-17 DIAGNOSIS — Z885 Allergy status to narcotic agent status: Secondary | ICD-10-CM | POA: Diagnosis not present

## 2017-08-17 DIAGNOSIS — R066 Hiccough: Secondary | ICD-10-CM | POA: Diagnosis not present

## 2017-08-17 DIAGNOSIS — Z87442 Personal history of urinary calculi: Secondary | ICD-10-CM

## 2017-08-17 DIAGNOSIS — G4733 Obstructive sleep apnea (adult) (pediatric): Secondary | ICD-10-CM | POA: Diagnosis present

## 2017-08-17 DIAGNOSIS — J45909 Unspecified asthma, uncomplicated: Secondary | ICD-10-CM | POA: Diagnosis present

## 2017-08-17 DIAGNOSIS — Z7982 Long term (current) use of aspirin: Secondary | ICD-10-CM

## 2017-08-17 DIAGNOSIS — M25561 Pain in right knee: Secondary | ICD-10-CM | POA: Diagnosis present

## 2017-08-17 DIAGNOSIS — Z8249 Family history of ischemic heart disease and other diseases of the circulatory system: Secondary | ICD-10-CM | POA: Diagnosis not present

## 2017-08-17 DIAGNOSIS — R112 Nausea with vomiting, unspecified: Secondary | ICD-10-CM

## 2017-08-17 HISTORY — PX: TOTAL KNEE ARTHROPLASTY: SHX125

## 2017-08-17 LAB — CREATININE, SERUM
Creatinine, Ser: 1 mg/dL (ref 0.61–1.24)
GFR calc Af Amer: >60 mL/min (ref >60)
GFR calc non Af Amer: >60 mL/min (ref >60)

## 2017-08-17 LAB — CBC
HCT: 42.2 % (ref 40.0–52.0)
HEMOGLOBIN: 14.3 g/dL (ref 13.0–18.0)
MCH: 30.3 pg (ref 26.0–34.0)
MCHC: 33.8 g/dL (ref 32.0–36.0)
MCV: 89.6 fL (ref 80.0–100.0)
Platelets: 145 10*3/uL — ABNORMAL LOW (ref 150–440)
RBC: 4.71 MIL/uL (ref 4.40–5.90)
RDW: 14.1 % (ref 11.5–14.5)
WBC: 5.4 10*3/uL (ref 3.8–10.6)

## 2017-08-17 LAB — ABO/RH: ABO/RH(D): B POS

## 2017-08-17 SURGERY — ARTHROPLASTY, KNEE, TOTAL
Anesthesia: Spinal | Site: Knee | Laterality: Right | Wound class: Clean

## 2017-08-17 MED ORDER — DOCUSATE SODIUM 100 MG PO CAPS
100.0000 mg | ORAL_CAPSULE | Freq: Two times a day (BID) | ORAL | Status: DC
  Administered 2017-08-17 – 2017-08-20 (×6): 100 mg via ORAL
  Filled 2017-08-17 (×5): qty 1

## 2017-08-17 MED ORDER — SODIUM CHLORIDE 0.9 % IJ SOLN
INTRAMUSCULAR | Status: DC | PRN
  Administered 2017-08-17: 30 mL

## 2017-08-17 MED ORDER — METHOCARBAMOL 1000 MG/10ML IJ SOLN
500.0000 mg | Freq: Four times a day (QID) | INTRAVENOUS | Status: DC | PRN
  Filled 2017-08-17: qty 5

## 2017-08-17 MED ORDER — MAGNESIUM CITRATE PO SOLN
1.0000 | Freq: Once | ORAL | Status: DC | PRN
  Filled 2017-08-17: qty 296

## 2017-08-17 MED ORDER — KETOROLAC TROMETHAMINE 30 MG/ML IJ SOLN
INTRAMUSCULAR | Status: AC
  Filled 2017-08-17: qty 1

## 2017-08-17 MED ORDER — MIDAZOLAM HCL 2 MG/2ML IJ SOLN
INTRAMUSCULAR | Status: AC
  Filled 2017-08-17: qty 2

## 2017-08-17 MED ORDER — TRAZODONE HCL 50 MG PO TABS: 25.0000 mg | ORAL_TABLET | Freq: Every evening | ORAL | Status: DC | PRN

## 2017-08-17 MED ORDER — VITAMIN B-6 50 MG PO TABS
100.0000 mg | ORAL_TABLET | Freq: Every day | ORAL | Status: DC
  Administered 2017-08-17 – 2017-08-20 (×3): 100 mg via ORAL
  Filled 2017-08-17: qty 2
  Filled 2017-08-17: qty 1
  Filled 2017-08-17 (×2): qty 2

## 2017-08-17 MED ORDER — ONDANSETRON HCL 4 MG/2ML IJ SOLN
INTRAMUSCULAR | Status: AC
  Filled 2017-08-17: qty 2

## 2017-08-17 MED ORDER — TRAMADOL HCL 50 MG PO TABS
50.0000 mg | ORAL_TABLET | Freq: Four times a day (QID) | ORAL | Status: DC
  Administered 2017-08-17 – 2017-08-20 (×10): 50 mg via ORAL
  Filled 2017-08-17 (×12): qty 1

## 2017-08-17 MED ORDER — VITAMIN B-12 1000 MCG PO TABS
1000.0000 ug | ORAL_TABLET | Freq: Every day | ORAL | Status: DC
  Administered 2017-08-17 – 2017-08-20 (×3): 1000 ug via ORAL
  Filled 2017-08-17 (×3): qty 1

## 2017-08-17 MED ORDER — KETOROLAC TROMETHAMINE 30 MG/ML IJ SOLN
INTRAMUSCULAR | Status: DC | PRN
  Administered 2017-08-17: 30 mg

## 2017-08-17 MED ORDER — OXYCODONE HCL 5 MG PO TABS: 5.0000 mg | ORAL_TABLET | Freq: Once | ORAL | Status: DC | PRN

## 2017-08-17 MED ORDER — NITROGLYCERIN 0.4 MG SL SUBL: 0.4000 mg | SUBLINGUAL_TABLET | SUBLINGUAL | Status: DC | PRN

## 2017-08-17 MED ORDER — PHENOL 1.4 % MT LIQD
1.0000 | OROMUCOSAL | Status: DC | PRN
  Filled 2017-08-17: qty 177

## 2017-08-17 MED ORDER — ACETAMINOPHEN 500 MG PO TABS
1000.0000 mg | ORAL_TABLET | Freq: Four times a day (QID) | ORAL | Status: AC
  Administered 2017-08-17 – 2017-08-18 (×4): 1000 mg via ORAL
  Filled 2017-08-17 (×4): qty 2

## 2017-08-17 MED ORDER — DIPHENHYDRAMINE HCL 12.5 MG/5ML PO ELIX: 12.5000 mg | ORAL_SOLUTION | ORAL | Status: DC | PRN

## 2017-08-17 MED ORDER — NEOMYCIN-POLYMYXIN B GU 40-200000 IR SOLN
Status: DC | PRN
  Administered 2017-08-17: 16 mL

## 2017-08-17 MED ORDER — MAGNESIUM HYDROXIDE 400 MG/5ML PO SUSP
30.0000 mL | Freq: Every day | ORAL | Status: DC | PRN
  Administered 2017-08-17: 30 mL via ORAL
  Filled 2017-08-17: qty 30

## 2017-08-17 MED ORDER — METHOCARBAMOL 500 MG PO TABS
500.0000 mg | ORAL_TABLET | Freq: Four times a day (QID) | ORAL | Status: DC | PRN
  Administered 2017-08-17: 500 mg via ORAL
  Filled 2017-08-17: qty 1

## 2017-08-17 MED ORDER — ASPIRIN EC 81 MG PO TBEC
81.0000 mg | DELAYED_RELEASE_TABLET | Freq: Every day | ORAL | Status: DC
  Administered 2017-08-17 – 2017-08-20 (×4): 81 mg via ORAL
  Filled 2017-08-17 (×4): qty 1

## 2017-08-17 MED ORDER — LORATADINE 10 MG PO TABS: 10.0000 mg | ORAL_TABLET | Freq: Every day | ORAL | Status: DC

## 2017-08-17 MED ORDER — MORPHINE SULFATE (PF) 10 MG/ML IV SOLN
INTRAVENOUS | Status: AC
  Filled 2017-08-17: qty 1

## 2017-08-17 MED ORDER — PHENYLEPHRINE HCL 10 MG/ML IJ SOLN
INTRAMUSCULAR | Status: AC
  Filled 2017-08-17: qty 1

## 2017-08-17 MED ORDER — NEOMYCIN-POLYMYXIN B GU 40-200000 IR SOLN
Status: AC
  Filled 2017-08-17: qty 20

## 2017-08-17 MED ORDER — METOCLOPRAMIDE HCL 10 MG PO TABS
5.0000 mg | ORAL_TABLET | Freq: Three times a day (TID) | ORAL | Status: DC | PRN
  Administered 2017-08-20: 10 mg via ORAL
  Filled 2017-08-17: qty 1

## 2017-08-17 MED ORDER — PROPOFOL 500 MG/50ML IV EMUL
INTRAVENOUS | Status: AC
  Filled 2017-08-17: qty 50

## 2017-08-17 MED ORDER — OXYCODONE HCL 5 MG/5ML PO SOLN: 5.0000 mg | Freq: Once | ORAL | Status: DC | PRN

## 2017-08-17 MED ORDER — MENTHOL 3 MG MT LOZG
1.0000 | LOZENGE | OROMUCOSAL | Status: DC | PRN
  Filled 2017-08-17: qty 9

## 2017-08-17 MED ORDER — MIDAZOLAM HCL 5 MG/5ML IJ SOLN
INTRAMUSCULAR | Status: DC | PRN
  Administered 2017-08-17: 2 mg via INTRAVENOUS

## 2017-08-17 MED ORDER — ALUM & MAG HYDROXIDE-SIMETH 200-200-20 MG/5ML PO SUSP: 30.0000 mL | ORAL | Status: DC | PRN

## 2017-08-17 MED ORDER — ENOXAPARIN SODIUM 30 MG/0.3ML ~~LOC~~ SOLN
30.0000 mg | Freq: Two times a day (BID) | SUBCUTANEOUS | Status: DC
  Administered 2017-08-18 – 2017-08-20 (×5): 30 mg via SUBCUTANEOUS
  Filled 2017-08-17 (×5): qty 0.3

## 2017-08-17 MED ORDER — BUPIVACAINE HCL (PF) 0.5 % IJ SOLN
INTRAMUSCULAR | Status: DC | PRN
  Administered 2017-08-17: 3 mL

## 2017-08-17 MED ORDER — OXYCODONE HCL 5 MG PO TABS
5.0000 mg | ORAL_TABLET | ORAL | Status: DC | PRN
  Administered 2017-08-17 (×2): 10 mg via ORAL
  Administered 2017-08-18: 5 mg via ORAL
  Administered 2017-08-18: 10 mg via ORAL
  Administered 2017-08-18: 5 mg via ORAL
  Administered 2017-08-18 (×2): 10 mg via ORAL
  Administered 2017-08-19 (×2): 5 mg via ORAL
  Filled 2017-08-17: qty 2
  Filled 2017-08-17: qty 1
  Filled 2017-08-17 (×3): qty 2
  Filled 2017-08-17 (×2): qty 1
  Filled 2017-08-17: qty 2
  Filled 2017-08-17: qty 1

## 2017-08-17 MED ORDER — ONDANSETRON HCL 4 MG PO TABS
4.0000 mg | ORAL_TABLET | Freq: Four times a day (QID) | ORAL | Status: DC | PRN
  Administered 2017-08-18 – 2017-08-19 (×2): 4 mg via ORAL
  Filled 2017-08-17 (×3): qty 1

## 2017-08-17 MED ORDER — BUPIVACAINE HCL (PF) 0.5 % IJ SOLN
INTRAMUSCULAR | Status: AC
  Filled 2017-08-17: qty 10

## 2017-08-17 MED ORDER — SODIUM CHLORIDE 0.9 % IV SOLN
INTRAVENOUS | Status: DC | PRN
  Administered 2017-08-17: 60 mL

## 2017-08-17 MED ORDER — ONDANSETRON HCL 4 MG/2ML IJ SOLN
4.0000 mg | Freq: Four times a day (QID) | INTRAMUSCULAR | Status: DC | PRN
  Administered 2017-08-17 – 2017-08-19 (×4): 4 mg via INTRAVENOUS
  Filled 2017-08-17 (×4): qty 2

## 2017-08-17 MED ORDER — SODIUM CHLORIDE 0.9 % IJ SOLN
INTRAMUSCULAR | Status: AC
  Filled 2017-08-17: qty 100

## 2017-08-17 MED ORDER — FENTANYL CITRATE (PF) 100 MCG/2ML IJ SOLN: 25.0000 ug | INTRAMUSCULAR | Status: DC | PRN

## 2017-08-17 MED ORDER — LORATADINE 10 MG PO TABS
10.0000 mg | ORAL_TABLET | Freq: Every day | ORAL | Status: DC
  Administered 2017-08-17 – 2017-08-20 (×3): 10 mg via ORAL
  Filled 2017-08-17 (×3): qty 1

## 2017-08-17 MED ORDER — PHENYLEPHRINE HCL 10 MG/ML IJ SOLN
INTRAMUSCULAR | Status: DC | PRN
  Administered 2017-08-17: 35 ug/min via INTRAVENOUS

## 2017-08-17 MED ORDER — ZOLPIDEM TARTRATE 5 MG PO TABS
5.0000 mg | ORAL_TABLET | Freq: Every evening | ORAL | Status: DC | PRN
  Administered 2017-08-19: 5 mg via ORAL
  Filled 2017-08-17: qty 1

## 2017-08-17 MED ORDER — METOCLOPRAMIDE HCL 5 MG/ML IJ SOLN
5.0000 mg | Freq: Three times a day (TID) | INTRAMUSCULAR | Status: DC | PRN
  Administered 2017-08-17 – 2017-08-19 (×3): 10 mg via INTRAVENOUS
  Filled 2017-08-17 (×2): qty 2

## 2017-08-17 MED ORDER — METOPROLOL TARTRATE 25 MG PO TABS
25.0000 mg | ORAL_TABLET | Freq: Two times a day (BID) | ORAL | Status: DC
  Administered 2017-08-17 – 2017-08-20 (×6): 25 mg via ORAL
  Filled 2017-08-17 (×6): qty 1

## 2017-08-17 MED ORDER — FAMOTIDINE 20 MG PO TABS
ORAL_TABLET | ORAL | Status: AC
  Administered 2017-08-17: 20 mg via ORAL
  Filled 2017-08-17: qty 1

## 2017-08-17 MED ORDER — ATORVASTATIN CALCIUM 20 MG PO TABS
40.0000 mg | ORAL_TABLET | Freq: Every day | ORAL | Status: DC
  Administered 2017-08-17 – 2017-08-19 (×3): 40 mg via ORAL
  Filled 2017-08-17 (×4): qty 2

## 2017-08-17 MED ORDER — BUPIVACAINE-EPINEPHRINE (PF) 0.25% -1:200000 IJ SOLN
INTRAMUSCULAR | Status: AC
  Filled 2017-08-17: qty 30

## 2017-08-17 MED ORDER — MORPHINE SULFATE 10 MG/ML IJ SOLN
INTRAMUSCULAR | Status: DC | PRN
  Administered 2017-08-17: 10 mg

## 2017-08-17 MED ORDER — ONDANSETRON HCL 4 MG/2ML IJ SOLN
INTRAMUSCULAR | Status: DC | PRN
  Administered 2017-08-17: 4 mg via INTRAVENOUS

## 2017-08-17 MED ORDER — BUPIVACAINE-EPINEPHRINE (PF) 0.25% -1:200000 IJ SOLN
INTRAMUSCULAR | Status: DC | PRN
  Administered 2017-08-17: 30 mL

## 2017-08-17 MED ORDER — ACETAMINOPHEN 325 MG PO TABS: 325.0000 mg | ORAL_TABLET | Freq: Four times a day (QID) | ORAL | Status: DC | PRN

## 2017-08-17 MED ORDER — PROPOFOL 500 MG/50ML IV EMUL
INTRAVENOUS | Status: DC | PRN
  Administered 2017-08-17: 125 ug/kg/min via INTRAVENOUS

## 2017-08-17 MED ORDER — BUPIVACAINE LIPOSOME 1.3 % IJ SUSP
INTRAMUSCULAR | Status: AC
  Filled 2017-08-17: qty 20

## 2017-08-17 MED ORDER — HYDROMORPHONE HCL 1 MG/ML IJ SOLN
0.5000 mg | INTRAMUSCULAR | Status: DC | PRN
  Administered 2017-08-17: 1 mg via INTRAVENOUS
  Filled 2017-08-17: qty 1

## 2017-08-17 MED ORDER — FAMOTIDINE 20 MG PO TABS
20.0000 mg | ORAL_TABLET | Freq: Once | ORAL | Status: AC
  Administered 2017-08-17: 20 mg via ORAL

## 2017-08-17 MED ORDER — SODIUM CHLORIDE 0.9 % IV SOLN
INTRAVENOUS | Status: DC
  Administered 2017-08-17: 1000 mL via INTRAVENOUS
  Administered 2017-08-17: 08:00:00 via INTRAVENOUS

## 2017-08-17 MED ORDER — LISINOPRIL 10 MG PO TABS
10.0000 mg | ORAL_TABLET | Freq: Every day | ORAL | Status: DC
  Administered 2017-08-17 – 2017-08-20 (×4): 10 mg via ORAL
  Filled 2017-08-17 (×4): qty 1

## 2017-08-17 MED ORDER — BISACODYL 10 MG RE SUPP: 10.0000 mg | Freq: Every day | RECTAL | Status: DC | PRN

## 2017-08-17 MED ORDER — OXYCODONE HCL 5 MG PO TABS: 10.0000 mg | ORAL_TABLET | ORAL | Status: DC | PRN

## 2017-08-17 MED ORDER — PROPOFOL 10 MG/ML IV BOLUS
INTRAVENOUS | Status: DC | PRN
  Administered 2017-08-17: 60 mg via INTRAVENOUS

## 2017-08-17 MED ORDER — CEFAZOLIN SODIUM-DEXTROSE 2-4 GM/100ML-% IV SOLN
2.0000 g | Freq: Four times a day (QID) | INTRAVENOUS | Status: AC
  Administered 2017-08-17 – 2017-08-18 (×3): 2 g via INTRAVENOUS
  Filled 2017-08-17 (×3): qty 100

## 2017-08-17 MED ORDER — SODIUM CHLORIDE 0.9 % IV SOLN
INTRAVENOUS | Status: DC
  Administered 2017-08-17: 300 mL via INTRAVENOUS
  Administered 2017-08-18: 06:00:00 via INTRAVENOUS

## 2017-08-17 SURGICAL SUPPLY — 73 items
BANDAGE ACE 6X5 VEL STRL LF (GAUZE/BANDAGES/DRESSINGS) ×3 IMPLANT
BLADE SAW 1 (BLADE) ×3 IMPLANT
BLOCK CUTTING FEMUR 4 RT (MISCELLANEOUS) ×2 IMPLANT
BLOCK CUTTING TIBIAL 4 RT MIS (MISCELLANEOUS) ×2 IMPLANT
BLOCK CUTTING TIBIAL S4 RT (MISCELLANEOUS) ×2 IMPLANT
CANISTER SUCT 1200ML W/VALVE (MISCELLANEOUS) ×3 IMPLANT
CANISTER SUCT 3000ML PPV (MISCELLANEOUS) ×6 IMPLANT
CEMENT HV SMART SET (Cement) ×6 IMPLANT
CHLORAPREP W/TINT 26ML (MISCELLANEOUS) ×6 IMPLANT
COOLER POLAR GLACIER W/PUMP (MISCELLANEOUS) ×3 IMPLANT
CUFF TOURN 24 STER (MISCELLANEOUS) IMPLANT
CUFF TOURN 30 STER DUAL PORT (MISCELLANEOUS) IMPLANT
DRAPE SHEET LG 3/4 BI-LAMINATE (DRAPES) ×6 IMPLANT
ELECT CAUTERY BLADE 6.4 (BLADE) ×3 IMPLANT
ELECT REM PT RETURN 9FT ADLT (ELECTROSURGICAL) ×3
ELECTRODE REM PT RTRN 9FT ADLT (ELECTROSURGICAL) ×1 IMPLANT
FEMORAL COMP SZ4 RIGHT SPHERE (Femur) ×2 IMPLANT
FEMUR BONE MODEL (MISCELLANEOUS) ×2 IMPLANT
GAUZE PETRO XEROFOAM 1X8 (MISCELLANEOUS) ×1 IMPLANT
GAUZE SPONGE 4X4 12PLY STRL (GAUZE/BANDAGES/DRESSINGS) ×1 IMPLANT
GLOVE BIOGEL PI IND STRL 7.0 (GLOVE) IMPLANT
GLOVE BIOGEL PI IND STRL 9 (GLOVE) ×1 IMPLANT
GLOVE BIOGEL PI INDICATOR 7.0 (GLOVE) ×12
GLOVE BIOGEL PI INDICATOR 9 (GLOVE) ×2
GLOVE INDICATOR 8.0 STRL GRN (GLOVE) ×3 IMPLANT
GLOVE SURG ORTHO 8.0 STRL STRW (GLOVE) ×3 IMPLANT
GLOVE SURG SYN 9.0  PF PI (GLOVE) ×2
GLOVE SURG SYN 9.0 PF PI (GLOVE) ×1 IMPLANT
GOWN SRG 2XL LVL 4 RGLN SLV (GOWNS) ×1 IMPLANT
GOWN STRL NON-REIN 2XL LVL4 (GOWNS) ×3
GOWN STRL REUS W/ TWL LRG LVL3 (GOWN DISPOSABLE) ×1 IMPLANT
GOWN STRL REUS W/ TWL XL LVL3 (GOWN DISPOSABLE) ×1 IMPLANT
GOWN STRL REUS W/TWL LRG LVL3 (GOWN DISPOSABLE) ×9
GOWN STRL REUS W/TWL XL LVL3 (GOWN DISPOSABLE) ×3
HOLDER FOLEY CATH W/STRAP (MISCELLANEOUS) ×3 IMPLANT
HOOD PEEL AWAY FLYTE STAYCOOL (MISCELLANEOUS) ×6 IMPLANT
IMMBOLIZER KNEE 19 BLUE UNIV (SOFTGOODS) ×3 IMPLANT
INSERT TIBIAL SZ4 RIGHT 10MM (Insert) ×2 IMPLANT
KIT PREVENA INCISION MGT20CM45 (CANNISTER) ×2 IMPLANT
KIT TURNOVER KIT A (KITS) ×3 IMPLANT
KNIFE SCULPS 14X20 (INSTRUMENTS) ×3 IMPLANT
NDL SAFETY ECLIPSE 18X1.5 (NEEDLE) ×1 IMPLANT
NDL SPNL 18GX3.5 QUINCKE PK (NEEDLE) ×1 IMPLANT
NDL SPNL 20GX3.5 QUINCKE YW (NEEDLE) ×1 IMPLANT
NEEDLE HYPO 18GX1.5 SHARP (NEEDLE) ×3
NEEDLE SPNL 18GX3.5 QUINCKE PK (NEEDLE) ×3 IMPLANT
NEEDLE SPNL 20GX3.5 QUINCKE YW (NEEDLE) ×3 IMPLANT
NS IRRIG 1000ML POUR BTL (IV SOLUTION) ×3 IMPLANT
PACK TOTAL KNEE (MISCELLANEOUS) ×3 IMPLANT
PAD WRAPON POLAR KNEE (MISCELLANEOUS) ×1 IMPLANT
PATELLA RESURFACING MEDACTA 02 (Bone Implant) ×2 IMPLANT
PULSAVAC PLUS IRRIG FAN TIP (DISPOSABLE) ×3
SOL .9 NS 3000ML IRR  AL (IV SOLUTION) ×2
SOL .9 NS 3000ML IRR AL (IV SOLUTION) ×1
SOL .9 NS 3000ML IRR UROMATIC (IV SOLUTION) ×1 IMPLANT
STAPLER SKIN PROX 35W (STAPLE) ×3 IMPLANT
STEM EXTENSION 11MMX30MM (Stem) ×2 IMPLANT
SUCTION FRAZIER HANDLE 10FR (MISCELLANEOUS) ×2
SUCTION TUBE FRAZIER 10FR DISP (MISCELLANEOUS) ×1 IMPLANT
SUT DVC 2 QUILL PDO  T11 36X36 (SUTURE) ×2
SUT DVC 2 QUILL PDO T11 36X36 (SUTURE) ×1 IMPLANT
SUT V-LOC 90 ABS DVC 3-0 CL (SUTURE) ×3 IMPLANT
SYR 20CC LL (SYRINGE) ×3 IMPLANT
SYR 3ML 18GX1 1/2 (SYRINGE) ×4 IMPLANT
SYR 50ML LL SCALE MARK (SYRINGE) ×6 IMPLANT
TIBIAL TRAY FIXED 4 MEDACTA 02 (Joint) ×2 IMPLANT
TIP FAN IRRIG PULSAVAC PLUS (DISPOSABLE) ×1 IMPLANT
TOWEL OR 17X26 4PK STRL BLUE (TOWEL DISPOSABLE) ×3 IMPLANT
TOWER CARTRIDGE SMART MIX (DISPOSABLE) ×3 IMPLANT
TRAY FOLEY W/METER SILVER 16FR (SET/KITS/TRAYS/PACK) ×3 IMPLANT
Tibial cutting block ×2 IMPLANT
WND VAC CANISTER 500ML (MISCELLANEOUS) ×2 IMPLANT
WRAPON POLAR PAD KNEE (MISCELLANEOUS) ×3

## 2017-08-17 NOTE — Transfer of Care (Signed)
Immediate Anesthesia Transfer of Care Note  Patient: Jacob Marks  Procedure(s) Performed: TOTAL KNEE ARTHROPLASTY (Right Knee)  Patient Location: PACU  Anesthesia Type:spinal  Level of Consciousness: sedated  Airway & Oxygen Therapy: Patient Spontanous Breathing and Patient connected to face mask oxygen  Post-op Assessment: Report given to RN and Post -op Vital signs reviewed and stable  Post vital signs: Reviewed and stable  Last Vitals:  Vitals Value Taken Time  BP    Temp    Pulse    Resp    SpO2      Last Pain:  Vitals:   08/17/17 0617  TempSrc: Tympanic  PainSc: 0-No pain         Complications: No apparent anesthesia complications

## 2017-08-17 NOTE — Anesthesia Post-op Follow-up Note (Signed)
Anesthesia QCDR form completed.        

## 2017-08-17 NOTE — Evaluation (Signed)
Physical Therapy Evaluation Patient Details Name: Jacob Marks MRN: [id number] DOB: 1960-09-04 Today's Date: 08/17/2017   History of Present Illness  Pt is a 57 yo M diagnosed with OA of the R knee and is s/p elective R TKA.  PMH includes: asthma, OSA, CAD, MI, HTN, CVA, anxiety/depression, GERD, and kidney CA.      Clinical Impression  Pt presents with deficits in strength, transfers, mobility, gait, balance, R knee ROM, and activity tolerance.  Pt required extra time and effort during bed mobility tasks but no physical assistance.  Pt tolerated gentle therex per below well without increased R knee pain.  Pt reported feeling a "pop" in his R knee while moving his knee in bed and on assessment pt's R patella appeared to be laterally displaced.  Pt and spouse reported that pt does have a history prior to surgery of his patella being laterally positioned but it appeared excessive to this PT.  Dr. Rudene Christians paged and arrived at pt's bedside and confirmed pt's patella was dislocating laterally and stated he would order a knee brace to assist with correct patella tracking.  Per Dr. Rudene Christians, once the knee brace has been donned pt can participate with PT services without restriction.  I anticipate that once pt has the brace donned that he will progress well with PT services and will be appropriate for HHPT services upon discharge from acute care to address the above deficits for return to PLOF.        Follow Up Recommendations Home health PT    Equipment Recommendations  Rolling walker with 5" wheels;3in1 (PT)    Recommendations for Other Services       Precautions / Restrictions Precautions Precautions: Knee Required Braces or Orthoses: Other Brace/Splint(Pt able to perform Ind RLE SLR without extensor lag, no KI required) Other Brace/Splint: Per Dr. Rudene Christians, pt to have patella alignment brace donned at all times Restrictions Weight Bearing Restrictions: Yes RLE Weight Bearing: Weight bearing as tolerated       Mobility  Bed Mobility Overal bed mobility: Modified Independent             General bed mobility comments: Extra time and effort but no physical assistance required during bed mobility tasks  Transfers                 General transfer comment: NT secondary to laterally tracking patella  Ambulation/Gait             General Gait Details: NT  Stairs            Wheelchair Mobility    Modified Rankin (Stroke Patients Only)       Balance Overall balance assessment: Needs assistance   Sitting balance-Leahy Scale: Normal         Standing balance comment: NT                             Pertinent Vitals/Pain Pain Assessment: 0-10 Pain Score: 4  Pain Location: R knee Pain Descriptors / Indicators: Aching;Operative site guarding;Sore Pain Intervention(s): Premedicated before session;Monitored during session;Limited activity within patient's tolerance    Home Living Family/patient expects to be discharged to:: Private residence Living Arrangements: Spouse/significant other Available Help at Discharge: Family Type of Home: House Home Access: Stairs to enter Entrance Stairs-Rails: Right Entrance Stairs-Number of Steps: 5 steps with R rail then one step without rails Home Layout: One level Home Equipment: Walker - standard  Prior Function Level of Independence: Independent         Comments: Ind with amb without AD with no fall history, Ind with ADLs, works FT as a Programmer, applications   Dominant Hand: Right    Extremity/Trunk Assessment   Upper Extremity Assessment Upper Extremity Assessment: Overall WFL for tasks assessed    Lower Extremity Assessment Lower Extremity Assessment: Generalized weakness;RLE deficits/detail RLE Deficits / Details: R hip flex 3+/5, B ankle DF and PF strength strong against manual resistance RLE: Unable to fully assess due to pain RLE Sensation: WNL        Communication   Communication: No difficulties  Cognition Arousal/Alertness: Awake/alert Behavior During Therapy: WFL for tasks assessed/performed Overall Cognitive Status: Within Functional Limits for tasks assessed                                        General Comments      Exercises Total Joint Exercises Ankle Circles/Pumps: Strengthening;Both;10 reps Quad Sets: Strengthening;Both;10 reps Gluteal Sets: Strengthening;Both;10 reps Hip ABduction/ADduction: AROM;Both;5 reps Straight Leg Raises: AROM;Both;5 reps Long Arc Quad: AROM;Right;5 reps Knee Flexion: AROM;Right;5 reps Goniometric ROM: Not tested secondary to laterally tracking patella Other Exercises Other Exercises: HEP education per handout with pt and spouse educated to hold off on all but APs and GS until knee brace donned Other Exercises: Positioning education with pt and spouse   Assessment/Plan    PT Assessment Patient needs continued PT services  PT Problem List Decreased strength;Decreased range of motion;Decreased activity tolerance;Decreased balance;Decreased cognition;Decreased coordination;Decreased mobility;Decreased knowledge of precautions       PT Treatment Interventions Gait training;Stair training;Functional mobility training;Balance training;Therapeutic exercise;Therapeutic activities;DME instruction;Patient/family education    PT Goals (Current goals can be found in the Care Plan section)  Acute Rehab PT Goals Patient Stated Goal: To walk better PT Goal Formulation: With patient Time For Goal Achievement: 08/30/17 Potential to Achieve Goals: Good    Frequency BID   Barriers to discharge        Co-evaluation               AM-PAC PT "6 Clicks" Daily Activity  Outcome Measure Difficulty turning over in bed (including adjusting bedclothes, sheets and blankets)?: A Little Difficulty moving from lying on back to sitting on the side of the bed? : A Little Difficulty  sitting down on and standing up from a chair with arms (e.g., wheelchair, bedside commode, etc,.)?: Unable Help needed moving to and from a bed to chair (including a wheelchair)?: Total Help needed walking in hospital room?: Total Help needed climbing 3-5 steps with a railing? : Total 6 Click Score: 10    End of Session   Activity Tolerance: Other (comment)(Pt limited by [place]alla tracking laterally, awaiting brace per Dr. Rudene Christians) Patient left: in bed;with bed alarm set;with call bell/phone within reach;with family/visitor present Nurse Communication: Mobility status;Other (comment)(Pt to have knee brace prior to WB, pt's BP 128/91 mmHg) PT Visit Diagnosis: Muscle weakness (generalized) (M62.81);Other abnormalities of gait and mobility (R26.89)    Time: 1505-1600 PT Time Calculation (min) (ACUTE ONLY): 55 min   Charges:   PT Evaluation $PT Eval Low Complexity: 1 Low PT Treatments $Therapeutic Exercise: 8-22 mins $Therapeutic Activity: 8-22 mins   PT G Codes:        DRoyetta Asal PT, DPT 08/17/17, 4:53 PM

## 2017-08-17 NOTE — H&P (Signed)
Reviewed paper H+P, will be scanned into chart. No changes noted.  

## 2017-08-17 NOTE — NC FL2 (Signed)
[place] LEVEL OF CARE SCREENING TOOL     IDENTIFICATION  Patient Name: Jacob Marks Birthdate: [birth year] Sex: male Admission Date (Current Location): 08/17/2017  [place] and [place] Number:  Engineering geologist and Address:  [facility], [address] [place], [place] [address]      Provider Number: [id number]  Attending Physician Name and Address:  [name], MD  Relative Name and Phone Number:       Current Level of Care: Hospital Recommended Level of Care: [place] Prior Approval Number:    Date Approved/Denied:   PASRR Number: [id number])  Discharge Plan: SNF    Current Diagnoses: Patient Active Problem List   Diagnosis Date Noted  . Status post total knee replacement using cement, right 08/17/2017  . Insomnia 06/12/2016  . Renal mass, right 05/08/2016  . Morbid obesity ([place]) 04/30/2015  . CAD (coronary artery disease) 05/07/2014  . Incarcerated incisional hernia 04/23/2014  . Erectile dysfunction 02/21/2014  . Allergic rhinitis 09/04/2013  . Vertigo, benign paroxysmal 09/04/2013  . STEMI (ST elevation myocardial infarction) ([place]) 08/12/2012  . Chest pain 03/21/2012  . GERD (gastroesophageal reflux disease) 03/21/2012  . Dyspnea 06/09/2011  . Post-nasal drip 06/09/2011  . Kidney stone 06/09/2011  . Anxiety 06/09/2011  . Dysphagia 03/19/2011  . Diverticulosis 01/04/2011  . Wound infection after surgery 01/04/2011  . CVA (cerebral infarction) 01/02/2011  . Depression 01/02/2011  . Anemia 01/02/2011  . Fatigue 08/05/2010  . Prostate cancer screening 08/05/2010  . TINEA VERSICOLOR 01/15/2010  . EYE FLOATERS 01/15/2010  . ONYCHIA AND PARONYCHIA OF FINGER 11/02/2008  . 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 Testosterone deficiency 11/16/2007  . HYPERTENSION, CONTROLLED 11/16/2007  . Sleep apnea 11/16/2007  . 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 Hyperlipidemia 05/28/2003     Orientation RESPIRATION BLADDER Height & Weight     Self, Time, Situation, Place  Normal Continent Weight: 265 lb (120.2 kg) Height:  5\' 9"  (175.3 cm)  BEHAVIORAL SYMPTOMS/MOOD NEUROLOGICAL BOWEL NUTRITION STATUS      Continent Diet  AMBULATORY STATUS COMMUNICATION OF NEEDS Skin   Extensive Assist Verbally Surgical wounds(Incision Right Knee)                       Personal Care Assistance Level of Assistance  Bathing, Feeding, Dressing Bathing Assistance: Limited assistance Feeding assistance: Independent Dressing Assistance: Limited assistance     Functional Limitations Info  Sight, Hearing, Speech Sight Info: Adequate Hearing Info: Adequate Speech Info: Adequate    SPECIAL CARE FACTORS FREQUENCY  PT (By licensed PT), OT (By licensed OT)     PT Frequency: (5) OT Frequency: (5)            Contractures      Additional Factors Info  Code Status, Allergies Code Status Info: (Full Code) Allergies Info: (No Known Allergies)           Current Medications (08/17/2017):  This is the current hospital active medication list Current Facility-Administered Medications  Medication Dose Route Frequency Provider Last Rate Last Dose  . 0.9 %  sodium chloride infusion   Intravenous Continuous Gunnar Bulla, MD 50 mL/hr at 08/17/17 0640    . fentaNYL (SUBLIMAZE) injection 25-50 mcg  25-50 mcg Intravenous Q5 min PRN Piscitello, Precious Haws, MD      . oxyCODONE (Oxy IR/ROXICODONE) immediate release tablet 5 mg  5 mg Oral Once PRN Piscitello, Precious Haws, MD  Or  . oxyCODONE (ROXICODONE) 5 MG/5ML solution 5 mg  5 mg Oral Once PRN Piscitello, Precious Haws, MD         Discharge Medications: Please see discharge summary for a list of discharge medications.  Relevant Imaging Results:  Relevant Lab Results:   Additional Information (SSN: [id number])  Smith Mince, Student-Social Work

## 2017-08-17 NOTE — Anesthesia Procedure Notes (Signed)
Date/Time: 08/17/2017 7:26 AM Performed by: Nelda Marseille, CRNA Pre-anesthesia Checklist: Patient identified, Emergency Drugs available, Suction available, Patient being monitored and Timeout performed Oxygen Delivery Method: Simple face mask

## 2017-08-17 NOTE — Anesthesia Preprocedure Evaluation (Signed)
Anesthesia Evaluation  Patient identified by MRN, date of birth, ID band Patient awake    Reviewed: Allergy & Precautions, H&P , NPO status , Patient's Chart, lab work & pertinent test results  History of Anesthesia Complications Negative for: history of anesthetic complications  Airway Mallampati: III  TM Distance: <3 FB Neck ROM: full    Dental  (+) Chipped   Pulmonary neg shortness of breath, asthma , sleep apnea , former smoker,           Cardiovascular Exercise Tolerance: Good hypertension, (-) angina+ CAD and + Past MI  (-) DOE      Neuro/Psych PSYCHIATRIC DISORDERS Anxiety Depression CVA, Residual Symptoms    GI/Hepatic Neg liver ROS, GERD  Medicated and Controlled,  Endo/Other  negative endocrine ROS  Renal/GU Renal disease     Musculoskeletal  (+) Arthritis ,   Abdominal   Peds  Hematology negative hematology ROS (+)   Anesthesia Other Findings Past Medical History: No date: Arthritis     Comment:  osteoarthritis of right knee No date: Cancer (HCC)     Comment:  kidney No date: Childhood asthma No date: Chronic kidney disease     Comment:  renal cyst right No date: Coronary artery disease 10/2010: CVA (cerebral infarction)     Comment:  after his hemicolectomy; "some paralysis right side of               mouth and slight speech problem as a result" (05/07/2014) 7/12: Diverticulosis     Comment:  with hemicolectomy-- complications  No date: ED (erectile dysfunction) No date: Heart murmur     Comment:  as a child No date: History of kidney cancer     Comment:  lesion on the right kidney No date: History of kidney stones No date: Hyperlipidemia No date: Hypertension No date: Kidney stones No date: OSA (obstructive sleep apnea)     Comment:  "has CPAP; doesn't wear it" (05/07/2014) No date: Pes planus 07/2012: STEMI (ST elevation myocardial infarction) ([place]) No date: Stroke [facility])     Comment:   slight right sided weakness - in face  No date: Testosterone deficiency  Past Surgical History: 05/07/2014: ABDOMINAL EXPLORATION SURGERY     Comment:  w/LO;&  Repair multiple, incarcerated incisional hernias              with mesh No date: CARDIAC CATHETERIZATION 8/12: carotid dopplers     Comment:  normal - after cva  No date: COLON SURGERY     Comment:  due to diverticulosis 07/2012: CORONARY ANGIOPLASTY WITH STENT PLACEMENT     Comment:  to the distal RCA 2014: [place] LITHOTRIPSY 10/2010: HEMICOLECTOMY     Comment:  for diverticulosis, complic by leaking anastamosis/               abcess/ addn surg and iliostomy  05/07/2014: HERNIA REPAIR     Comment:  8 repairs 10/2010: ILEOSTOMY 06/2011: ILEOSTOMY CLOSURE 05/07/2014: INCISIONAL HERNIA REPAIR; N/A     Comment:  Procedure: REPAIR RECURRENT INCISIONAL HERNIA;  Surgeon:              Fanny Skates, MD;  Location: [place];  Service: General;               Laterality: N/A; 05/07/2014: INSERTION OF MESH; N/A     Comment:  Procedure: INSERTION OF MESH;  Surgeon: Fanny Skates,               MD;  Location: [place] OR;  Service: General;  Laterality:               N/A; 05/08/2016: ROBOTIC ASSITED PARTIAL NEPHRECTOMY; Right     Comment:  Procedure: ROBOTIC ASSITED RETROPERITONEAL PARTIAL               NEPHRECTOMY;  Surgeon: Alexis Frock, MD;  Location: [facility]               ORS;  Service: Urology;  Laterality: Right; No date: TESTICLE SURGERY     Comment:  as a child; "they didn't descend" No date: TONSILLECTOMY AND ADENOIDECTOMY     Comment:  as a child No date: VASECTOMY  BMI    Body Mass Index:  39.13 kg/m      Reproductive/Obstetrics negative OB ROS                             Anesthesia Physical Anesthesia Plan  ASA: III  Anesthesia Plan: Spinal   Post-op Pain Management:    Induction:   PONV Risk Score and Plan:   Airway Management Planned: Natural Airway and Nasal  Cannula  Additional Equipment:   Intra-op Plan:   Post-operative Plan:   Informed Consent: I have reviewed the patients History and Physical, chart, labs and discussed the procedure including the risks, benefits and alternatives for the proposed anesthesia with the patient or authorized representative who has indicated his/her understanding and acceptance.   Dental Advisory Given  Plan Discussed with: Anesthesiologist, CRNA and Surgeon  Anesthesia Plan Comments: (Patient reports no bleeding problems and no anticoagulant use.  Plan for spinal with backup GA  Patient consented for risks of anesthesia including but not limited to:  - adverse reactions to medications - risk of bleeding, infection, nerve damage and headache - risk of failed spinal - damage to teeth, lips or other oral mucosa - sore throat or hoarseness - Damage to heart, brain, lungs or loss of life  Patient voiced understanding.)        Anesthesia Quick Evaluation

## 2017-08-17 NOTE — Op Note (Signed)
08/17/2017  9:34 AM  PATIENT:  Jacob Marks  57 y.o. male  PRE-OPERATIVE DIAGNOSIS:  PRIMARY OSTEOARTHRITIS OF RIGHT KNEE  POST-OPERATIVE DIAGNOSIS:  PRIMARY OSTEOARTHRITIS OF RIGHT KNEE  PROCEDURE:  Procedure(s): TOTAL KNEE ARTHROPLASTY (Right)  SURGEON: Laurene Footman, MD  ASSISTANTS: Rachelle Hora, PA-C  ANESTHESIA:   spinal  EBL:  Total I/O In: 1300 [I.V.:1300] Out: 375 [Urine:300; Blood:75]  BLOOD ADMINISTERED:none  DRAINS: none   LOCAL MEDICATIONS USED:  MARCAINE    and OTHER Exparel morphine and Toradol  SPECIMEN:  No Specimen  DISPOSITION OF SPECIMEN:  N/A  COUNTS:  YES  TOURNIQUET:   Total Tourniquet Time Documented: Thigh (Right) - 77 minutes Total: Thigh (Right) - 77 minutes   IMPLANTS: Medacta GMK Sphere 4 femur, 4 tibia, 10 mm insert with short stem on tibia and 2 patella, all components cemented  DICTATION: .Dragon Dictation   patient brought the operating room and after adequate spinal anesthesia was obtained right leg was prepped and draped in sterile fashion. After patient identification and timeout procedures were completed tourniquet was raised and midline skin incision was made followed by lateral parapatellar arthrotomy. The knee revealed extensive patellofemoral compartment degenerative changes with somesynovitis. The fat pad and anterior cruciate ligament and PCL were excised and the proximal tibia cutting guide was applied to the proximal tibia and proximaltibia cut carried out followed by distal femur cut. The 4-in-1 cutting guide was applied to the femur anterior posterior and chamfer cuts made. The posterior horns of the menisci could be resected this time and the size 4tibia baseplate trial was placed in apporpriote rotationand proximal reaming carried out for short stem. The keel punch was placed followed by the 4 femoral trial. Following this the trials were placed and the 80mm insert gave good stability through range of motion. This was  thenchosen as the final implant. Distal femur drill holes were made followed by the trochlear groove cut and then trials were removed. The patella was affected with arthritis as welland resection was carried out drilling plate carried out and then a size 2patella fit well. These trials were all removed Thelocal anesthetic noted above was infiltrated in the para-articular tissue. The bony surfaces thoroughly irrigated and dried. Tibial component was cemented in place first followed by the plastic insert and then the femoral component and the knee placed in extension with excess cement being removed. Next the patellar button was clamped into place and after the cement entirely set the clamp was removed.  The patella tracked well with the tourniquet down.  The knee was thoroughly irrigated and then closed with a heavy Quill, with 3-0  v-loc  suture followed by skin staples.  Incisional wound VAC applied followed by a Polar Care    PLAN OF CARE: Admit to inpatient   PATIENT DISPOSITION:  PACU - hemodynamically stable.

## 2017-08-17 NOTE — Anesthesia Procedure Notes (Signed)
Spinal  Patient location during procedure: OR Start time: 08/17/2017 7:20 AM End time: 08/17/2017 7:26 AM Staffing Resident/CRNA: Nelda Marseille, CRNA Performed: resident/CRNA  Preanesthetic Checklist Completed: patient identified, site marked, surgical consent, pre-op evaluation, timeout performed, IV checked, risks and benefits discussed and monitors and equipment checked Spinal Block Patient position: sitting Prep: Betadine Patient monitoring: heart rate, continuous pulse ox, blood pressure and cardiac monitor Approach: midline Location: L3-4 Injection technique: single-shot Needle Needle type: Whitacre and Introducer  Needle gauge: 25 G Needle length: 9 cm Assessment Sensory level: T10 Additional Notes Negative paresthesia. Negative blood return. Positive free-flowing CSF. Expiration date of kit checked and confirmed. Patient tolerated procedure well, without complications.

## 2017-08-18 ENCOUNTER — Encounter: Payer: Self-pay | Admitting: Orthopedic Surgery

## 2017-08-18 LAB — CBC
HCT: 39.9 % — ABNORMAL LOW (ref 40.0–52.0)
Hemoglobin: 13.9 g/dL (ref 13.0–18.0)
MCH: 30.9 pg (ref 26.0–34.0)
MCHC: 34.8 g/dL (ref 32.0–36.0)
MCV: 88.6 fL (ref 80.0–100.0)
PLATELETS: 171 10*3/uL (ref 150–440)
RBC: 4.5 MIL/uL (ref 4.40–5.90)
RDW: 14.3 % (ref 11.5–14.5)
WBC: 12.2 10*3/uL — AB (ref 3.8–10.6)

## 2017-08-18 LAB — BASIC METABOLIC PANEL
ANION GAP: 6 (ref 5–15)
BUN: 20 mg/dL (ref 6–20)
CO2: 25 mmol/L (ref 22–32)
CREATININE: 0.94 mg/dL (ref 0.61–1.24)
Calcium: 8.2 mg/dL — ABNORMAL LOW (ref 8.9–10.3)
Chloride: 106 mmol/L (ref 101–111)
GFR calc Af Amer: >60 mL/min (ref >60)
GLUCOSE: 159 mg/dL — AB (ref 65–99)
Potassium: 4.7 mmol/L (ref 3.5–5.1)
Sodium: 137 mmol/L (ref 135–145)

## 2017-08-18 MED ORDER — METOCLOPRAMIDE HCL 5 MG/ML IJ SOLN
10.0000 mg | Freq: Four times a day (QID) | INTRAMUSCULAR | Status: AC
  Administered 2017-08-18 – 2017-08-19 (×3): 10 mg via INTRAVENOUS
  Filled 2017-08-18 (×3): qty 2

## 2017-08-18 MED ORDER — SCOPOLAMINE 1 MG/3DAYS TD PT72
1.0000 | MEDICATED_PATCH | TRANSDERMAL | Status: DC
  Administered 2017-08-18: 1.5 mg via TRANSDERMAL
  Filled 2017-08-18 (×3): qty 1

## 2017-08-18 NOTE — Progress Notes (Signed)
Patient still vomiting after dose of Zofran and Scopolamine Patch applied.  IV fluids still running. Positive for bowel sounds and passing flatus.  MD notified. New orders for Reglan 10mg  IV x 3 doses.

## 2017-08-18 NOTE — Progress Notes (Signed)
Physical Therapy Treatment Patient Details Name: Jacob Marks MRN: [id number] DOB: 02/15/1961 Today's Date: 08/18/2017    History of Present Illness Pt is a 57 yo M diagnosed with OA of the R knee and is s/p elective R TKA.  PMH includes: asthma, OSA, CAD, MI, HTN, CVA, anxiety/depression, GERD, and kidney CA.      PT Comments    Pt presents with deficits in strength, transfers, mobility, gait, balance, R knee ROM, and activity tolerance.  Pt with R patellar stabilizer brace donned during session.  Pt tolerated supine therex well but upon sitting felt dizzy and nauseous.  Pt's BP in sitting 156/81 mmHg with SpO2 and HR WNL.  Dizziness subsided and pt able to perform seated therex.  Pt able to perform some transfer training and standing activity including several short steps at the EOB but required to return to sitting secondary to increasing nausea.  Pt declined further amb attempts this session.  Pt functionally strong with good eccentric and concentric control during transfers and during limited amb prior to onset of nausea and pt should progress well towards goals once nausea is resolved.  Pt will benefit from HHPT services upon discharge to safely address above deficits for decreased caregiver assistance and eventual return to PLOF.     Follow Up Recommendations  Home health PT     Equipment Recommendations  Other (comment); BARIATRIC Rolling walker with 5" wheels;3in1 (PT)(Bariatric RW)    Recommendations for Other Services       Precautions / Restrictions Precautions Precautions: Knee Required Braces or Orthoses: Other Brace/Splint Other Brace/Splint: Patellar stabilizer brace donned at all times Restrictions Weight Bearing Restrictions: Yes RLE Weight Bearing: Weight bearing as tolerated    Mobility  Bed Mobility Overal bed mobility: Modified Independent             General bed mobility comments: Extra time and effort but no physical assistance required during bed  mobility tasks  Transfers Overall transfer level: Needs assistance Equipment used: Rolling walker (2 wheeled) Transfers: Sit to/from Stand Sit to Stand: Min guard         General transfer comment: Min verbal cues for sequencing with good eccentric and eccentric control   Ambulation/Gait Ambulation/Gait assistance: Min guard Ambulation Distance (Feet): 3 Feet Assistive device: Rolling walker (2 wheeled) Gait Pattern/deviations: Step-to pattern;Decreased step length - left;Decreased stance time - right Gait velocity: Decreased   General Gait Details: Pt steady but limited by nausea, nursing aware   Stairs             Wheelchair Mobility    Modified Rankin (Stroke Patients Only)       Balance Overall balance assessment: Needs assistance   Sitting balance-Leahy Scale: Normal     Standing balance support: Bilateral upper extremity supported Standing balance-Leahy Scale: Good                              Cognition Arousal/Alertness: Awake/alert Behavior During Therapy: WFL for tasks assessed/performed Overall Cognitive Status: Within Functional Limits for tasks assessed                                        Exercises Total Joint Exercises Ankle Circles/Pumps: Strengthening;Both;5 reps;10 reps Quad Sets: Strengthening;Both;10 reps;15 reps Gluteal Sets: Strengthening;Both;10 reps;15 reps Heel Slides: AROM;Right;5 reps Hip ABduction/ADduction: AROM;Right;5 reps Straight Leg Raises: AROM;Right;10  reps Long Arc Quad: AROM;Right;10 reps;15 reps Knee Flexion: AROM;Right;10 reps;15 reps Goniometric ROM: R knee AROM: 11-95 deg Marching in Standing: AROM;Both;5 reps Other Exercises Other Exercises: HEP education review per handout with pt  Other Exercises: Positioning education review with pt     General Comments        Pertinent Vitals/Pain Pain Assessment: No/denies pain    Home Living                      Prior  Function            PT Goals (current goals can now be found in the care plan section) Progress towards PT goals: Progressing toward goals    Frequency    BID      PT Plan Current plan remains appropriate    Co-evaluation              AM-PAC PT "6 Clicks" Daily Activity  Outcome Measure                   End of Session Equipment Utilized During Treatment: Gait belt Activity Tolerance: Other (comment)(Limited by nausea) Patient left: in bed;with bed alarm set;with call bell/phone within reach;with SCD's reapplied;Other (comment)(Polar care to R knee) Nurse Communication: Mobility status PT Visit Diagnosis: Muscle weakness (generalized) (M62.81);Other abnormalities of gait and mobility (R26.89)     Time: 8003-4917 PT Time Calculation (min) (ACUTE ONLY): 40 min  Charges:  $Therapeutic Exercise: 23-37 mins $Therapeutic Activity: 8-22 mins                    G Codes:       D. Royetta Asal PT, DPT 08/18/17, 11:22 AM

## 2017-08-18 NOTE — Progress Notes (Signed)
Clinical Social Worker (CSW) received SNF consult. PT is recommending home health. RN case manager aware of above. Please reconsult if future social work needs arise. CSW signing off.   Hayly Litsey, LCSW [phone] 

## 2017-08-18 NOTE — Progress Notes (Signed)
Physical Therapy Treatment Patient Details Name: Jacob Marks MRN: [id number] DOB: 11/02/1960 Today's Date: 08/18/2017    History of Present Illness Pt is a 57 yo M diagnosed with OA of the R knee and is s/p elective R TKA.  PMH includes: asthma, OSA, CAD, MI, HTN, CVA, anxiety/depression, GERD, and kidney CA.      PT Comments    Pt presents with deficits in strength, transfers, mobility, gait, balance, R knee ROM, and activity tolerance.  Pt continues to require increased time and effort but no physical assistance with bed mobility tasks.  Pt steady with transfers with SBA but continues to require min verbal cues for proper sequencing.  Pt required frequent therapeutic rest breaks during therex secondary to waves of nausea with pt eventually vomiting a moderate amount, nursing notified.  Pt reported feeling slightly better after vomiting and willing to attempt amb.  Pt able to amb 1 x 5' and then 1 x 50' with step-to gait antalgic on the R with slow cadence but steady without LOB.  Pt reported 8-9/10 pain in the R knee and distal RLE that improved to 6-7/10 at end of session, nursing notified.  Pt will benefit from HHPT services upon discharge to safely address above deficits for decreased caregiver assistance and eventual return to PLOF.     Follow Up Recommendations  Home health PT     Equipment Recommendations  Other (comment);Rolling walker with 5" wheels;3in1 (PT)(Bariatric RW)    Recommendations for Other Services       Precautions / Restrictions Precautions Precautions: Knee Required Braces or Orthoses: Other Brace/Splint Other Brace/Splint: Patellar stabilizer brace donned at all times Restrictions Weight Bearing Restrictions: Yes RLE Weight Bearing: Weight bearing as tolerated    Mobility  Bed Mobility Overal bed mobility: Modified Independent             General bed mobility comments: Extra time and effort but no physical assistance required during bed mobility  tasks  Transfers Overall transfer level: Needs assistance Equipment used: Rolling walker (2 wheeled) Transfers: Sit to/from Stand Sit to Stand: Supervision         General transfer comment: Min verbal cues for sequencing with good eccentric and eccentric control   Ambulation/Gait Ambulation/Gait assistance: Min guard Ambulation Distance (Feet): 50 Feet Assistive device: Rolling walker (2 wheeled) Gait Pattern/deviations: Step-to pattern;Decreased step length - left;Decreased stance time - right Gait velocity: Decreased   General Gait Details: Pt steady with amb with antalgic, step-to gait pattern but remains limited by nausea, nursing aware   Stairs             Wheelchair Mobility    Modified Rankin (Stroke Patients Only)       Balance Overall balance assessment: Needs assistance   Sitting balance-Leahy Scale: Normal     Standing balance support: Bilateral upper extremity supported Standing balance-Leahy Scale: Good                              Cognition Arousal/Alertness: Awake/alert Behavior During Therapy: WFL for tasks assessed/performed Overall Cognitive Status: Within Functional Limits for tasks assessed                                        Exercises Total Joint Exercises Ankle Circles/Pumps: Strengthening;Both;5 reps;10 reps Quad Sets: Strengthening;Both;10 reps;15 reps Gluteal Sets: Strengthening;Both;10 reps;15 reps Heel  Slides: AROM;Right;5 reps Hip ABduction/ADduction: AROM;Right;5 reps Straight Leg Raises: AROM;Right;10 reps Long Arc Quad: AROM;Right;10 reps;15 reps Knee Flexion: AROM;Right;10 reps;15 reps Goniometric ROM: R knee AROM: 11-95 deg Marching in Standing: AROM;Both;5 reps Other Exercises Other Exercises: HEP education review per handout with pt  Other Exercises: Positioning education review with pt     General Comments        Pertinent Vitals/Pain Pain Assessment: 0-10 Pain Score: 8   Pain Location: R knee and distal RLE Pain Descriptors / Indicators: Aching;Operative site guarding;Sore Pain Intervention(s): Premedicated before session;Monitored during session;Patient requesting pain meds-RN notified    Home Living                      Prior Function            PT Goals (current goals can now be found in the care plan section) Progress towards PT goals: Progressing toward goals    Frequency    BID      PT Plan Current plan remains appropriate    Co-evaluation              AM-PAC PT "6 Clicks" Daily Activity  Outcome Measure                   End of Session Equipment Utilized During Treatment: Gait belt Activity Tolerance: Other (comment)(limited by nausea and vomiting) Patient left: in bed;with bed alarm set;with call bell/phone within reach;with SCD's reapplied;Other (comment)(Polar care to R knee) Nurse Communication: Mobility status PT Visit Diagnosis: Muscle weakness (generalized) (M62.81);Other abnormalities of gait and mobility (R26.89)     Time: 6568-1275 PT Time Calculation (min) (ACUTE ONLY): 57 min  Charges:  $Gait Training: 8-22 mins $Therapeutic Exercise: 23-37 mins $Therapeutic Activity: 8-22 mins                    G Codes:       DRoyetta Asal PT, DPT 08/18/17, 2:39 PM

## 2017-08-18 NOTE — Anesthesia Postprocedure Evaluation (Signed)
Anesthesia Post Note  Patient: Jacob Marks  Procedure(s) Performed: TOTAL KNEE ARTHROPLASTY (Right Knee)  Patient location during evaluation: Other Anesthesia Type: Spinal Level of consciousness: oriented and awake and alert Pain management: pain level controlled Vital Signs Assessment: post-procedure vital signs reviewed and stable Respiratory status: spontaneous breathing, respiratory function stable and patient connected to nasal cannula oxygen Cardiovascular status: blood pressure returned to baseline and stable Postop Assessment: no headache, no backache and no apparent nausea or vomiting Anesthetic complications: no     Last Vitals:  Vitals:   08/18/17 0007 08/18/17 0441  BP: 125/74 126/74  Pulse: 78 72  Resp: 18 18  Temp: (!) 36.3 C 36.5 C  SpO2: 98% 99%    Last Pain:  Vitals:   08/18/17 0441  TempSrc: Oral  PainSc:                  Alison Stalling

## 2017-08-19 ENCOUNTER — Inpatient Hospital Stay: Payer: BC Managed Care – PPO

## 2017-08-19 LAB — CBC
HCT: 39.2 % — ABNORMAL LOW (ref 40.0–52.0)
HEMOGLOBIN: 13.6 g/dL (ref 13.0–18.0)
MCH: 30.4 pg (ref 26.0–34.0)
MCHC: 34.6 g/dL (ref 32.0–36.0)
MCV: 87.7 fL (ref 80.0–100.0)
Platelets: 157 10*3/uL (ref 150–440)
RBC: 4.47 MIL/uL (ref 4.40–5.90)
RDW: 14.2 % (ref 11.5–14.5)
WBC: 12.4 10*3/uL — ABNORMAL HIGH (ref 3.8–10.6)

## 2017-08-19 LAB — BASIC METABOLIC PANEL
ANION GAP: 8 (ref 5–15)
BUN: 17 mg/dL (ref 6–20)
CHLORIDE: 102 mmol/L (ref 101–111)
CO2: 25 mmol/L (ref 22–32)
Calcium: 8.5 mg/dL — ABNORMAL LOW (ref 8.9–10.3)
Creatinine, Ser: 0.78 mg/dL (ref 0.61–1.24)
GFR calc Af Amer: >60 mL/min (ref >60)
GFR calc non Af Amer: >60 mL/min (ref >60)
Glucose, Bld: 163 mg/dL — ABNORMAL HIGH (ref 65–99)
POTASSIUM: 3.9 mmol/L (ref 3.5–5.1)
SODIUM: 135 mmol/L (ref 135–145)

## 2017-08-19 MED ORDER — CHLORPROMAZINE HCL 10 MG PO TABS
10.0000 mg | ORAL_TABLET | Freq: Three times a day (TID) | ORAL | Status: DC | PRN
  Administered 2017-08-19: 10 mg via ORAL
  Filled 2017-08-19 (×2): qty 1

## 2017-08-19 MED ORDER — HYDROCODONE-ACETAMINOPHEN 7.5-325 MG PO TABS: 1.0000 | ORAL_TABLET | ORAL | 0 refills | Status: DC | PRN

## 2017-08-19 MED ORDER — HYDROCODONE-ACETAMINOPHEN 7.5-325 MG PO TABS
1.0000 | ORAL_TABLET | ORAL | Status: DC | PRN
  Administered 2017-08-20 (×2): 1 via ORAL
  Filled 2017-08-19 (×4): qty 1

## 2017-08-19 MED ORDER — PROMETHAZINE HCL 25 MG/ML IJ SOLN: 12.5000 mg | Freq: Once | INTRAMUSCULAR | Status: DC

## 2017-08-19 MED ORDER — ENOXAPARIN SODIUM 40 MG/0.4ML ~~LOC~~ SOLN: 40.0000 mg | SUBCUTANEOUS | 0 refills | Status: DC

## 2017-08-19 MED ORDER — ONDANSETRON 4 MG PO TBDP: 4.0000 mg | ORAL_TABLET | Freq: Once | ORAL | 0 refills | Status: AC

## 2017-08-19 MED ORDER — METOCLOPRAMIDE HCL 5 MG/ML IJ SOLN
10.0000 mg | Freq: Four times a day (QID) | INTRAMUSCULAR | Status: DC
  Administered 2017-08-19 – 2017-08-20 (×2): 10 mg via INTRAVENOUS
  Filled 2017-08-19 (×3): qty 2

## 2017-08-19 MED ORDER — METHOCARBAMOL 500 MG PO TABS: 500.0000 mg | ORAL_TABLET | Freq: Four times a day (QID) | ORAL | 0 refills | Status: DC | PRN

## 2017-08-19 MED ORDER — OXYCODONE HCL 5 MG PO TABS: 5.0000 mg | ORAL_TABLET | ORAL | 0 refills | Status: DC | PRN

## 2017-08-19 MED ORDER — SIMETHICONE 80 MG PO CHEW: 80.0000 mg | CHEWABLE_TABLET | Freq: Four times a day (QID) | ORAL | 0 refills | Status: DC

## 2017-08-19 MED ORDER — SIMETHICONE 80 MG PO CHEW
80.0000 mg | CHEWABLE_TABLET | Freq: Four times a day (QID) | ORAL | Status: DC
  Administered 2017-08-19 – 2017-08-20 (×3): 80 mg via ORAL
  Filled 2017-08-19 (×5): qty 1

## 2017-08-19 MED ORDER — SCOPOLAMINE 1 MG/3DAYS TD PT72: 1.0000 | MEDICATED_PATCH | TRANSDERMAL | 12 refills | Status: DC

## 2017-08-19 NOTE — Discharge Summary (Signed)
Physician Discharge Summary  Patient ID: Jacob Marks MRN: [id number] DOB/AGE: [birth year] 57 y.o.  Admit date: 08/17/2017 Discharge date: Aug 20, 2017  Admission Diagnoses:  PRIMARY OSTEOARTHRITIS OF RIGHT KNEE   Discharge Diagnoses: Patient Active Problem List   Diagnosis Date Noted  . Status post total knee replacement using cement, right 08/17/2017  . Insomnia 06/12/2016  . Renal mass, right 05/08/2016  . Morbid obesity ([place]) 04/30/2015  . CAD (coronary artery disease) 05/07/2014  . Incarcerated incisional hernia 04/23/2014  . Erectile dysfunction 02/21/2014  . Allergic rhinitis 09/04/2013  . Vertigo, benign paroxysmal 09/04/2013  . STEMI (ST elevation myocardial infarction) ([place]) 08/12/2012  . Chest pain 03/21/2012  . GERD (gastroesophageal reflux disease) 03/21/2012  . Dyspnea 06/09/2011  . Post-nasal drip 06/09/2011  . Kidney stone 06/09/2011  . Anxiety 06/09/2011  . Dysphagia 03/19/2011  . Diverticulosis 01/04/2011  . Wound infection after surgery 01/04/2011  . CVA (cerebral infarction) 01/02/2011  . Depression 01/02/2011  . Anemia 01/02/2011  . Fatigue 08/05/2010  . Prostate cancer screening 08/05/2010  . TINEA VERSICOLOR 01/15/2010  . EYE FLOATERS 01/15/2010  . ONYCHIA AND PARONYCHIA OF FINGER 11/02/2008  . 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 Testosterone deficiency 11/16/2007  . HYPERTENSION, CONTROLLED 11/16/2007  . Sleep apnea 11/16/2007  . 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 Hyperlipidemia 05/28/2003    Past Medical History:  Diagnosis Date  . Arthritis    osteoarthritis of right knee  . Cancer ([place])    kidney  . Childhood asthma   . Chronic kidney disease    renal cyst right  . Coronary artery disease   . CVA (cerebral infarction) 10/2010   after his hemicolectomy; "some paralysis right side of mouth and slight speech problem as a result" (05/07/2014)  . Diverticulosis 7/12   with hemicolectomy-- complications   . ED  (erectile dysfunction)   . Heart murmur    as a child  . History of kidney cancer    lesion on the right kidney  . History of kidney stones   . Hyperlipidemia   . Hypertension   . Kidney stones   . OSA (obstructive sleep apnea)    "has CPAP; doesn't wear it" (05/07/2014)  . Pes planus   . STEMI (ST elevation myocardial infarction) ([place]) 07/2012  . Stroke ([place])    slight right sided weakness - in face   . Testosterone deficiency      Transfusion: none   Consultants (if any): Treatment Team:  Epifanio Lesches, MD  Discharged Condition: Improved  Hospital Course: Jacob Marks is an 57 y.o. male who was admitted 08/17/2017 with a diagnosis of right knee osteoarthritis and went to the operating room on 08/17/2017 and underwent the above named procedures.  The patient began experiencing significant nausea with vomiting on postop day 2.  The patient had an ileus on x-ray.  The patient did have a bowel movement on postop day 3 and was feeling somewhat better.  He has been doing better with his nausea and vomiting.  The patient is ready to go home.   Surgeries: Procedure(s): TOTAL KNEE ARTHROPLASTY on 08/17/2017 Patient tolerated the surgery well. Taken to PACU where she was stabilized and then transferred to the orthopedic floor.  Started on Lovenox 30 mg q 12 hrs. Foot pumps applied bilaterally at 80 mm. Heels elevated on bed with rolled towels. No evidence of DVT. Negative [place]. Physical therapy started on day #1 for gait training and transfer. OT started  day #1 for ADL and assisted devices.  Patient's foley was d/c on day #1.  On postop day 1 patient had significant nausea with vomiting.  IV fluids were continued.  On postop day 2 patient continued to have nausea with large episode of vomiting after PT.  He denies any abdominal pain, was able to have a bowel movement but due to persistent nausea despite Reglan, Zofran and history of abdominal bowel resection KUB was ordered.  X-ray  showed concern for ileus.  Medicine consult was ordered and patient started on simethicone.  Patient was started on clear liquid diet.  On postop day 1 and 2 patient made good progress with PT but was limited due to nausea and vomiting.  Patient was noted to have mild subluxation of the patella laterally.  He was placed into a lateral J brace.  He will continue with lateral J brace at all times to help maintain alignment of the patella.  On post op day #3 the patient began improving with his nausea.  He had a bowel movement and was still having gas.  The patient is having less nausea with moving around.  The patient is ready to go home after clearance with medicine...  Implants: Medacta GMK Sphere 4 femur, 4 tibia, 10 mm insert with short stem on tibia and 2 patella, all components cemented    He was given perioperative antibiotics:  Anti-infectives (From admission, onward)   Start     Dose/Rate Route Frequency Ordered Stop   08/17/17 1400  ceFAZolin (ANCEF) IVPB 2g/100 mL premix     2 g 200 mL/hr over 30 Minutes Intravenous Every 6 hours 08/17/17 1115 08/18/17 0300   08/16/17 2145  ceFAZolin (ANCEF) 3 g in dextrose 5 % 50 mL IVPB     3 g 130 mL/hr over 30 Minutes Intravenous  Once 08/16/17 2140 08/17/17 0749    .  He was given sequential compression devices, early ambulation, and Lovenox for DVT prophylaxis.  He benefited maximally from the hospital stay and there were no complications.    Recent vital signs:  Vitals:   08/19/17 1620 08/19/17 2220  BP: (!) 146/73 131/80  Pulse: 84 (!) 101  Resp:    Temp: 98.3 F (36.8 C) 98.5 F (36.9 C)  SpO2: 97% 97%    Recent laboratory studies:  Lab Results  Component Value Date   HGB 13.6 08/19/2017   HGB 13.9 08/18/2017   HGB 14.3 08/17/2017   Lab Results  Component Value Date   WBC 12.4 (H) 08/19/2017   PLT 157 08/19/2017   Lab Results  Component Value Date   INR 0.96 08/04/2017   Lab Results  Component Value Date   NA  135 08/19/2017   K 3.9 08/19/2017   CL 102 08/19/2017   CO2 25 08/19/2017   BUN 17 08/19/2017   CREATININE 0.78 08/19/2017   GLUCOSE 163 (H) 08/19/2017    Discharge Medications:   Allergies as of 08/20/2017   No Known Allergies     Medication List    TAKE these medications   aspirin EC 81 MG tablet Take 81 mg by mouth daily.   atorvastatin 40 MG tablet Commonly known as:  LIPITOR TAKE ONE TABLET BY MOUTH ONCE DAILY   cetirizine 10 MG tablet Commonly known as:  ZYRTEC Take 10 mg by mouth at bedtime.   enoxaparin 40 MG/0.4ML injection Commonly known as:  LOVENOX Inject 0.4 mLs (40 mg total) into the skin daily for 14 days.  HYDROcodone-acetaminophen 7.5-325 MG tablet Commonly known as:  NORCO Take 1-2 tablets by mouth every 4 (four) hours as needed for moderate pain.   lisinopril 10 MG tablet Commonly known as:  PRINIVIL,ZESTRIL TAKE ONE TABLET BY MOUTH ONCE DAILY   loratadine 10 MG tablet Commonly known as:  CLARITIN Take 10 mg by mouth daily.   methocarbamol 500 MG tablet Commonly known as:  ROBAXIN Take 1 tablet (500 mg total) by mouth every 6 (six) hours as needed for muscle spasms.   metoprolol tartrate 25 MG tablet Commonly known as:  LOPRESSOR TAKE ONE TABLET BY MOUTH TWICE DAILY *PLEASE KEEP UPCOMING APPOINTMENT*   nitroGLYCERIN 0.4 MG SL tablet Commonly known as:  NITROSTAT Place 1 tablet (0.4 mg total) under the tongue every 5 (five) minutes as needed for chest pain.   pyridOXINE 100 MG tablet Commonly known as:  VITAMIN B-6 Take 100 mg by mouth daily.   scopolamine 1 MG/3DAYS Commonly known as:  TRANSDERM-SCOP Place 1 patch (1.5 mg total) onto the skin every 3 (three) days. Start taking on:  08/21/2017   simethicone 80 MG chewable tablet Commonly known as:  MYLICON Chew 1 tablet (80 mg total) by mouth 4 (four) times daily.   traZODone 50 MG tablet Commonly known as:  DESYREL Take 0.5-1 tablets (25-50 mg total) by mouth at bedtime as needed  for sleep.   VITAMIN B 12 PO Take 1,000 mcg by mouth daily.     ASK your doctor about these medications   ondansetron 4 MG disintegrating tablet Commonly known as:  ZOFRAN ODT Take 1 tablet (4 mg total) by mouth once for 1 dose. Ask about: Should I take this medication?            Durable Medical Equipment  (From admission, onward)        Start     Ordered   08/17/17 1116  DME Walker rolling  Once    Question:  Patient needs a walker to treat with the following condition  Answer:  Status post total knee replacement using cement, right   08/17/17 1115   08/17/17 1116  DME 3 n 1  Once     08/17/17 1115   08/17/17 1116  DME Bedside commode  Once    Question:  Patient needs a bedside commode to treat with the following condition  Answer:  Status post total knee replacement using cement, right   08/17/17 1115      Diagnostic Studies: Dg Knee 1-2 Views Right  Result Date: 08/17/2017 CLINICAL DATA:  Status post right total knee joint prosthesis placement. EXAM: RIGHT KNEE - 1-2 VIEW COMPARISON:  Right knee series of June 19, 2013 and CT scan of the right knee of July 09, 2017. FINDINGS: The patient has undergone placement of a total right knee joint prosthesis. Radiographic positioning of the prosthetic components is good. The interface with the native bone is normal. A small air-fluid level is noted in the retropatellar space. A surgical wound VAC and skin staples are present. IMPRESSION: No immediate postprocedure complication following right total knee joint prosthesis placement. Electronically Signed   By: David  Martinique M.D.   On: 08/17/2017 10:04   Dg Abd 1 View  Result Date: 08/19/2017 CLINICAL DATA:  Nausea and vomiting since knee replacement 2 days ago EXAM: ABDOMEN - 1 VIEW COMPARISON:  11/26/2016 FINDINGS: Diffuse gaseous distention of colon. A few gas dilated small bowel loops are seen. Gas reaches the distal sigmoid. Mild right lower lobe atelectasis.  IMPRESSION: Diffuse  gaseous distention of colon compatible with ileus. Electronically Signed   By: Monte Fantasia M.D.   On: 08/19/2017 16:18    Disposition:     Follow-up Information    Duanne Guess, PA-C Follow up in 2 week(s).   Specialties:  Orthopedic Surgery, Emergency Medicine Contact information: [place] [address] [phone]            Signed: Reche Dixon 08/20/2017, 6:33 AM

## 2017-08-19 NOTE — Progress Notes (Signed)
Physical Therapy Treatment Patient Details Name: Jacob Marks MRN: [id number] DOB: 1961/04/07 Today's Date: 08/19/2017    History of Present Illness Pt is a 57 yo M diagnosed with OA of the R knee and is s/p elective R TKA.  PMH includes: asthma, OSA, CAD, MI, HTN, CVA, anxiety/depression, GERD, and kidney CA.      PT Comments    Pt continues to be significantly limited with progress towards PT goals by nausea and vomiting.  Pt tolerated therex and LE movement in supine but after only several minutes in sitting pt began to vomit heavily with vomit being reddish in color.  Nursing notified and arrived in room to assist.  Pt's spouse taken to rehab gym for education regarding sequencing for ascending and descending steps with a R rail with pt's spouse able to demonstrate correct technique.  Pt will benefit from PT services in a SNF setting upon discharge to safely address above deficits for decreased caregiver assistance and eventual return to PLOF.    Follow Up Recommendations  SNF     Equipment Recommendations       Recommendations for Other Services       Precautions / Restrictions Precautions Precautions: Knee Required Braces or Orthoses: Other Brace/Splint Other Brace/Splint: Patellar stabilizer brace donned at all times Restrictions Weight Bearing Restrictions: Yes RLE Weight Bearing: Weight bearing as tolerated    Mobility  Bed Mobility Overal bed mobility: Modified Independent             General bed mobility comments: Extra time and effort but no physical assistance required during bed mobility tasks  Transfers Overall transfer level: Needs assistance Equipment used: Rolling walker (2 wheeled) Transfers: Sit to/from Stand Sit to Stand: Supervision         General transfer comment: NT this session secondary to nausea and vomiting  Ambulation/Gait Ambulation/Gait assistance: Min guard Ambulation Distance (Feet): 40 Feet Assistive device: Rolling walker (2  wheeled) Gait Pattern/deviations: Step-to pattern;Decreased step length - left;Decreased stance time - right Gait velocity: Decreased   General Gait Details: NT this session secondary to nausea and vomiting   Stairs Stairs: (Stair training with pt's spouse on proper sequencing with stairs with R rail)           Wheelchair Mobility    Modified Rankin (Stroke Patients Only)       Balance Overall balance assessment: Needs assistance   Sitting balance-Leahy Scale: Normal     Standing balance support: Bilateral upper extremity supported Standing balance-Leahy Scale: Good                              Cognition Arousal/Alertness: Awake/alert Behavior During Therapy: WFL for tasks assessed/performed Overall Cognitive Status: Within Functional Limits for tasks assessed                                        Exercises Total Joint Exercises Ankle Circles/Pumps: Strengthening;Both;5 reps;10 reps Quad Sets: Strengthening;Both;10 reps;15 reps Gluteal Sets: Strengthening;Both;10 reps;15 reps Heel Slides: AROM;Right;5 reps Hip ABduction/ADduction: AROM;Right;5 reps Straight Leg Raises: AROM;Right;5 reps Long Arc Quad: AROM;Right;10 reps Knee Flexion: AROM;Right;10 reps Goniometric ROM: R knee AROM: 9-98 deg Marching in Standing: AROM;Both;10 reps    General Comments        Pertinent Vitals/Pain Pain Assessment: 0-10 Pain Score: 4  Pain Location: R knee  Pain Descriptors /  Indicators: Aching;Operative site guarding;Sore Pain Intervention(s): Monitored during session;Patient requesting pain meds-RN notified    Home Living                      Prior Function            PT Goals (current goals can now be found in the care plan section) Progress towards PT goals: Not progressing toward goals - comment( Limited progress secondary to nausea and vomiting)    Frequency    BID      PT Plan Current plan remains appropriate     Co-evaluation              AM-PAC PT "6 Clicks" Daily Activity  Outcome Measure                   End of Session Equipment Utilized During Treatment: Gait belt Activity Tolerance: Other (comment)(Pt limited this session secondary to nausea and vomiting) Patient left: in bed(Nursing assisting pt who was actively vomiting while sitting at EOB) Nurse Communication: Mobility status;Other (comment)(Pt vomiting heavily, nursing arrived to assist ) PT Visit Diagnosis: Muscle weakness (generalized) (M62.81);Other abnormalities of gait and mobility (R26.89)     Time: 1450-1507 PT Time Calculation (min) (ACUTE ONLY): 17 min  Charges:  $Gait Training: 8-22 mins $Therapeutic Exercise: 8-22 mins                    G Codes:       DRoyetta Asal PT, DPT 08/19/17, 4:03 PM

## 2017-08-19 NOTE — Progress Notes (Signed)
Subjective: 2 Days Post-Op Procedure(s) (LRB): TOTAL KNEE ARTHROPLASTY (Right) Patient reports pain as mild.   Patient is well, but has had some minor complaints of nausea/vomiting.  Nausea improving from yesterday.  He denies any abdominal pain.  Patient is passing gas Denies any CP, SOB. We will continue therapy today.  Plan is to go Home after hospital stay.  Objective: Vital signs in last 24 hours: Temp:  [97.8 F (36.6 C)-98.3 F (36.8 C)] 98.3 F (36.8 C) (05/02 0843) Pulse Rate:  [72-93] 93 (05/02 0843) Resp:  [17-18] 17 (05/01 2239) BP: (130-156)/(75-82) 143/75 (05/02 0843) SpO2:  [95 %-99 %] 95 % (05/02 0843)  Intake/Output from previous day: 05/01 0701 - 05/02 0700 In: 240 [P.O.:240] Out: -  Intake/Output this shift: No intake/output data recorded.  Recent Labs    08/17/17 1134 08/18/17 0254  HGB 14.3 13.9   Recent Labs    08/17/17 1134 08/18/17 0254  WBC 5.4 12.2*  RBC 4.71 4.50  HCT 42.2 39.9*  PLT 145* 171   Recent Labs    08/17/17 1134 08/18/17 0254  NA  --  137  K  --  4.7  CL  --  106  CO2  --  25  BUN  --  20  CREATININE 1.00 0.94  GLUCOSE  --  159*  CALCIUM  --  8.2*   No results for input(s): LABPT, INR in the last 72 hours.  EXAM General - Patient is Alert, Appropriate and Oriented  Abdomen-soft nontender nondistended normal bowel sounds Extremity - Neurovascular intact Sensation intact distally Intact pulses distally Dorsiflexion/Plantar flexion intact No cellulitis present Compartment soft Dressing - dressing C/D/I and no drainage lateral J brace intact.,  Wound VAC intact without drainage Motor Function - intact, moving foot and toes well on exam.   Past Medical History:  Diagnosis Date  . Arthritis    osteoarthritis of right knee  . Cancer ([place])    kidney  . Childhood asthma   . Chronic kidney disease    renal cyst right  . Coronary artery disease   . CVA (cerebral infarction) 10/2010   after his hemicolectomy;  "some paralysis right side of mouth and slight speech problem as a result" (05/07/2014)  . Diverticulosis 7/12   with hemicolectomy-- complications   . ED (erectile dysfunction)   . Heart murmur    as a child  . History of kidney cancer    lesion on the right kidney  . History of kidney stones   . Hyperlipidemia   . Hypertension   . Kidney stones   . OSA (obstructive sleep apnea)    "has CPAP; doesn't wear it" (05/07/2014)  . Pes planus   . STEMI (ST elevation myocardial infarction) ([place]) 07/2012  . Stroke ([place])    slight right sided weakness - in face   . Testosterone deficiency     Assessment/Plan:   2 Days Post-Op Procedure(s) (LRB): TOTAL KNEE ARTHROPLASTY (Right) Active Problems:   Status post total knee replacement using cement, right  Estimated body mass index is 39.13 kg/m as calculated from the following:   Height as of this encounter: 5\' 9"  (1.753 m).   Weight as of this encounter: 120.2 kg (265 lb). Advance diet Up with therapy  Needs bowel movement Labs pending for this morning Nausea: Continue with Reglan, Zofran.  We will continue to monitor.  Abdomen is currently soft with normal bowel sounds. Care management to assist with discharge to home with home health  PT. Plan on discharge to home with home health PT on Friday pending progress with PT and nausea  DVT Prophylaxis - Lovenox, Foot Pumps and TED hose Weight-Bearing as tolerated to right leg with lateral J brace   T. Rachelle Hora, PA-C [place] 08/19/2017, 9:03 AM

## 2017-08-19 NOTE — Discharge Instructions (Signed)

## 2017-08-19 NOTE — Progress Notes (Signed)
About 1500, PT called RN to room stating that pt was vomiting. Pt was sitting EOB and had vomited large amount (about 300-400 cc) reddish brown liquid with oatmeal pieces. Pt has only had a few bites of oatmeal today, and minimal PO fluids (nothing red colored). Rachelle Hora PA notified, received order for one time order phenergan and KUB.   [place], Jerry Caras

## 2017-08-19 NOTE — Care Management Note (Signed)
Case Management Note  Patient Details  Name: Jacob Marks MRN: [id number] Date of Birth: May 06, 1960  Subjective/Objective:   Met with wife at bedside to discuss discharge planning. Offered list of home health agencies. Referral to [facility] for HHPT. Ordered walker and bsc. [phone]. Ordered Lovenox 40 mg # 14 no refills. Cost of Lovenox is $ 30.00. PCP is Dr. Glori Bickers.                    Action/Plan:   Expected Discharge Date:                  Expected Discharge Plan:  [place]  In-House Referral:     Discharge planning Services  CM Consult  Post Acute Care Choice:  Durable Medical Equipment, Home Health Choice offered to:  Spouse  DME Arranged:  Bedside commode, Walker rolling DME Agency:  [place]:  PT Queets:  [facility] (formerly [facility])  Status of Service:  In process, will continue to follow  If discussed at Long Length of Stay Meetings, dates discussed:    Additional Comments:  Jolly Mango, RN 08/19/2017, 11:31 AM

## 2017-08-19 NOTE — Consult Note (Signed)
Patient Demographics  Jacob Marks, is a 57 y.o. male   MRN: [id number]   DOB - 30-Jun-1960  Admit Date - 08/17/2017    Outpatient Primary MD for the patient is Tower, Wynelle Fanny, MD  Consult requested in the Hospital by [name], MD, On 08/19/2017    Reason for consult ; ileus HPI: 57 year old male patient with history of ostial arthritis of the right knee status post right TKA 2 days ago, history of asthma, obstructive sleep apnea, CAD, hypertension, anxiety, depression, GERD has been having nausea, multiple episodes of vomiting and unable to keep anything down.  Patient wife mentioned that ever since surgery he has been having nausea, vomiting but this afternoon patient had really big vomiting.  X-ray of abdomen showed ileus so we are consulted for that.  Patient denies abdominal pain.  Started on liquid diet by orthopedic and stop her regular diet.  Has been having hiccups.  Had 2 BMs.  On stool softeners.  No further vomiting since afternoon.  No nausea also.  And is receiving Zofran, scopolamine patch.  Started on Reglan.  Patient has flatus, denies abdominal pain.  With History of -  Past Medical History:  Diagnosis Date  . Arthritis    osteoarthritis of right knee  . Cancer ([place])    kidney  . Childhood asthma   . Chronic kidney disease    renal cyst right  . Coronary artery disease   . CVA (cerebral infarction) 10/2010   after his hemicolectomy; "some paralysis right side of mouth and slight speech problem as a result" (05/07/2014)  . Diverticulosis 7/12   with hemicolectomy-- complications   . ED (erectile dysfunction)   . Heart murmur    as a child  . History of kidney cancer    lesion on the right kidney  . History of kidney stones   . Hyperlipidemia   . Hypertension   . Kidney stones   . OSA (obstructive  sleep apnea)    "has CPAP; doesn't wear it" (05/07/2014)  . Pes planus   . STEMI (ST elevation myocardial infarction) ([place]) 07/2012  . Stroke ([place])    slight right sided weakness - in face   . Testosterone deficiency       Past Surgical History:  Procedure Laterality Date  . ABDOMINAL EXPLORATION SURGERY  05/07/2014   w/LO;&  Repair multiple, incarcerated incisional hernias with mesh  . CARDIAC CATHETERIZATION    . carotid dopplers  8/12   normal - after cva   . COLON SURGERY     due to diverticulosis  . CORONARY ANGIOPLASTY WITH STENT PLACEMENT  07/2012   to the distal RCA  . EXTRACORPOREAL SHOCK WAVE LITHOTRIPSY  2014  . HEMICOLECTOMY  10/2010   for diverticulosis, complic by leaking anastamosis/ abcess/ addn surg and iliostomy   . HERNIA REPAIR  05/07/2014   8 repairs  . ILEOSTOMY  10/2010  . ILEOSTOMY CLOSURE  06/2011  . INCISIONAL HERNIA REPAIR N/A 05/07/2014   Procedure:  REPAIR RECURRENT INCISIONAL HERNIA;  Surgeon: Fanny Skates, MD;  Location: [facility];  Service: General;  Laterality: N/A;  . INSERTION OF MESH N/A 05/07/2014   Procedure: INSERTION OF MESH;  Surgeon: Fanny Skates, MD;  Location: [facility];  Service: General;  Laterality: N/A;  . ROBOTIC ASSITED PARTIAL NEPHRECTOMY Right 05/08/2016   Procedure: ROBOTIC ASSITED RETROPERITONEAL PARTIAL NEPHRECTOMY;  Surgeon: Alexis Frock, MD;  Location: [facility];  Service: Urology;  Laterality: Right;  . TESTICLE SURGERY     as a child; "they didn't descend"  . TONSILLECTOMY AND ADENOIDECTOMY     as a child  . TOTAL KNEE ARTHROPLASTY Right 08/17/2017   Procedure: TOTAL KNEE ARTHROPLASTY;  Surgeon: [name], MD;  Location: [facility];  Service: Orthopedics;  Laterality: Right;  Marland Kitchen VASECTOMY         Review of Systems    In addition to the HPI above,  No Fever-chills, No Headache, No changes with Vision or hearing, No problems swallowing food or Liquids, No Chest pain, Cough or Shortness of Breath, No Abdominal pain, nausea,  vomiting., No Blood in stool or Urine, No dysuria, No new skin rashes or bruises, Patient has right knee pain status post right TKA.,  No new weakness, tingling, numbness in any extremity, No recent weight gain or loss, No polyuria, polydypsia or polyphagia, No significant Mental Stressors.  A full 10 point Review of Systems was done, except as stated above, all other Review of Systems were negative.   Social History Social History   Tobacco Use  . Smoking status: Former Smoker    Packs/day: 1.00    Years: 2.00    Pack years: 2.00    Types: Cigarettes    Last attempt to quit: 04/20/1978    Years since quitting: 39.3  . Smokeless tobacco: Never Used  Substance Use Topics  . Alcohol use: Yes    Alcohol/week: 0.0 oz    Comment: 05/07/2014 "maybe a beer twice/month"    Family History Family History  Problem Relation Age of Onset  . Heart disease Mother 48       MI  . Hypertension Mother   . Alcohol abuse Mother   . Heart disease Father   . Hypertension Father   . Cancer Sister        colon     Prior to Admission medications   Medication Sig Start Date End Date Taking? Authorizing Provider  aspirin EC 81 MG tablet Take 81 mg by mouth daily.   Yes [provider]  atorvastatin (LIPITOR) 40 MG tablet TAKE ONE TABLET BY MOUTH ONCE DAILY 12/03/16  Yes Gollan, Kathlene November, MD  cetirizine (ZYRTEC) 10 MG tablet Take 10 mg by mouth at bedtime.    Yes [provider]  Cyanocobalamin (VITAMIN B 12 PO) Take 1,000 mcg by mouth daily.   Yes [provider]  lisinopril (PRINIVIL,ZESTRIL) 10 MG tablet TAKE ONE TABLET BY MOUTH ONCE DAILY 11/17/16  Yes Gollan, Kathlene November, MD  loratadine (CLARITIN) 10 MG tablet Take 10 mg by mouth daily.   Yes [provider]  metoprolol tartrate (LOPRESSOR) 25 MG tablet TAKE ONE TABLET BY MOUTH TWICE DAILY *PLEASE KEEP UPCOMING APPOINTMENT* 08/25/16  Yes Gollan, Kathlene November, MD  pyridOXINE (VITAMIN B-6) 100 MG tablet Take 100  mg by mouth daily.   Yes [provider]  traZODone (DESYREL) 50 MG tablet Take 0.5-1 tablets (25-50 mg total) by mouth at bedtime as needed for sleep. 06/12/16  Yes Pleas Koch,  NP  enoxaparin (LOVENOX) 40 MG/0.4ML injection Inject 0.4 mLs (40 mg total) into the skin daily for 14 days. 08/19/17 09/02/17  Duanne Guess, PA-C  HYDROcodone-acetaminophen (NORCO) 7.5-325 MG tablet Take 1-2 tablets by mouth every 4 (four) hours as needed for moderate pain. 08/19/17   Duanne Guess, PA-C  nitroGLYCERIN (NITROSTAT) 0.4 MG SL tablet Place 1 tablet (0.4 mg total) under the tongue every 5 (five) minutes as needed for chest pain. 04/30/15   Minna Merritts, MD  ondansetron (ZOFRAN ODT) 4 MG disintegrating tablet Take 1 tablet (4 mg total) by mouth once for 1 dose. 08/19/17 08/19/17  Duanne Guess, PA-C    Anti-infectives (From admission, onward)   Start     Dose/Rate Route Frequency Ordered Stop   08/17/17 1400  ceFAZolin (ANCEF) IVPB 2g/100 mL premix     2 g 200 mL/hr over 30 Minutes Intravenous Every 6 hours 08/17/17 1115 08/18/17 0300   08/16/17 2145  ceFAZolin (ANCEF) 3 g in dextrose 5 % 50 mL IVPB     3 g 130 mL/hr over 30 Minutes Intravenous  Once 08/16/17 2140 08/17/17 0749      Scheduled Meds: . aspirin EC  81 mg Oral Daily  . atorvastatin  40 mg Oral Daily  . docusate sodium  100 mg Oral BID  . enoxaparin (LOVENOX) injection  30 mg Subcutaneous Q12H  . lisinopril  10 mg Oral Daily  . loratadine  10 mg Oral Daily  . metoCLOPramide (REGLAN) injection  10 mg Intravenous Q6H  . metoprolol tartrate  25 mg Oral BID  . promethazine  12.5 mg Intravenous Once  . pyridOXINE  100 mg Oral Daily  . scopolamine  1 patch Transdermal Q72H  . simethicone  80 mg Oral QID  . traMADol  50 mg Oral Q6H  . vitamin B-12  1,000 mcg Oral Daily   Continuous Infusions: . sodium chloride 75 mL/hr at 08/18/17 0546  . methocarbamol (ROBAXIN)  IV     PRN Meds:.acetaminophen, alum & mag  hydroxide-simeth, bisacodyl, chlorproMAZINE, diphenhydrAMINE, HYDROcodone-acetaminophen, HYDROmorphone (DILAUDID) injection, magnesium citrate, magnesium hydroxide, menthol-cetylpyridinium **OR** phenol, methocarbamol **OR** methocarbamol (ROBAXIN)  IV, metoCLOPramide **OR** metoCLOPramide (REGLAN) injection, nitroGLYCERIN, ondansetron **OR** ondansetron (ZOFRAN) IV, traZODone, zolpidem  No Known Allergies  Physical Exam  Vitals  Blood pressure (!) 146/73, pulse 84, temperature 98.3 F (36.8 C), resp. rate 17, height 5\' 9"  (1.753 m), weight 120.2 kg (265 lb), SpO2 97 %.   1. General alert, awake, oriented.  Having hiccups. 2. Normal affect and insight, Not Suicidal or Homicidal, Awake Alert, Oriented X 3.  3. No F.N deficits, ALL C.Nerves Intact, Strength 5/5 all 4 extremities, Sensation intact all 4 extremities, Plantars down going.  4. Ears and Eyes appear Normal, Conjunctivae clear, PERRLA. Moist Oral Mucosa.  5. Supple Neck, No JVD, No cervical lymphadenopathy appriciated, No Carotid Bruits.  6. Symmetrical Chest wall movement, Good air movement bilaterally, CTAB.  7. RRR, No Gallops, Rubs or Murmurs, No Parasternal Heave.  8.  Bowel sounds present, nontender, nondistended.  Abdomen is soft.  Patient is able to pass gas 9.  No Cyanosis, Normal Skin Turgor, No Skin Rash or Bruise.  10. Good muscle tone,  joints appear normal , no effusions, Normal ROM.  11. No Palpable Lymph Nodes in Neck or Axillae   Data Review  CBC Recent Labs  Lab 08/17/17 1134 08/18/17 0254 08/19/17 0920  WBC 5.4 12.2* 12.4*  HGB 14.3 13.9 13.6  HCT 42.2 39.9* 39.2*  PLT 145* 171 157  MCV 89.6 88.6 87.7  MCH 30.3 30.9 30.4  MCHC 33.8 34.8 34.6  RDW 14.1 14.3 14.2   ------------------------------------------------------------------------------------------------------------------  Chemistries  Recent Labs  Lab 08/17/17 1134 08/18/17 0254 08/19/17 0920  NA  --  137 135  K  --  4.7 3.9   CL  --  106 102  CO2  --  25 25  GLUCOSE  --  159* 163*  BUN  --  20 17  CREATININE 1.00 0.94 0.78  CALCIUM  --  8.2* 8.5*   ------------------------------------------------------------------------------------------------------------------ estimated creatinine clearance is 130.4 mL/min (by C-G formula based on SCr of 0.78 mg/dL). ------------------------------------------------------------------------------------------------------------------ No results for input(s): TSH, T4TOTAL, T3FREE, THYROIDAB in the last 72 hours.  Invalid input(s): FREET3   Coagulation profile No results for input(s): INR, PROTIME in the last 168 hours. ------------------------------------------------------------------------------------------------------------------- No results for input(s): DDIMER in the last 72 hours. -------------------------------------------------------------------------------------------------------------------  Cardiac Enzymes No results for input(s): CKMB, TROPONINI, MYOGLOBIN in the last 168 hours.  Invalid input(s): CK ------------------------------------------------------------------------------------------------------------------ Invalid input(s): POCBNP   ---------------------------------------------------------------------------------------------------------------  Urinalysis    Component Value Date/Time   COLORURINE YELLOW (A) 08/04/2017 0818   APPEARANCEUR CLEAR (A) 08/04/2017 0818   APPEARANCEUR Clear 02/26/2014 1801   LABSPEC 1.012 08/04/2017 0818   LABSPEC 1.020 02/26/2014 1801   PHURINE 6.0 08/04/2017 0818   GLUCOSEU NEGATIVE 08/04/2017 0818   GLUCOSEU Negative 02/26/2014 1801   HGBUR NEGATIVE 08/04/2017 0818   BILIRUBINUR NEGATIVE 08/04/2017 0818   BILIRUBINUR Negative 02/26/2014 1801   KETONESUR NEGATIVE 08/04/2017 0818   PROTEINUR NEGATIVE 08/04/2017 0818   UROBILINOGEN 1.0 05/04/2014 1449   NITRITE NEGATIVE 08/04/2017 0818   LEUKOCYTESUR NEGATIVE  08/04/2017 0818   LEUKOCYTESUR Negative 02/26/2014 1801     Imaging results:   Dg Abd 1 View  Result Date: 08/19/2017 CLINICAL DATA:  Nausea and vomiting since knee replacement 2 days ago EXAM: ABDOMEN - 1 VIEW COMPARISON:  11/26/2016 FINDINGS: Diffuse gaseous distention of colon. A few gas dilated small bowel loops are seen. Gas reaches the distal sigmoid. Mild right lower lobe atelectasis. IMPRESSION: Diffuse gaseous distention of colon compatible with ileus. Electronically Signed   By: Monte Fantasia M.D.   On: 08/19/2017 16:18       Assessment & Plan  Active Problems:   Status post total knee replacement using cement, right   .  57 year old male patient with history right knee osteoarthritis status post elective right total knee arthroplasty by Dr. Rudene Christians on April 30 th  #1 intractable nausea, vomiting secondary to ileus: Patient is able to pass gas, had BM today.  So at this time conservative management with stool softeners with Colace 100 mg twice daily, use Reglan and Phenergan for nausea, repeat Abdominal x-ray tomorrow for follow-up.  Patient x-ray is not consistent with clinical exam.  Patient has no abdominal pain.  Able to pass gas.  So at this time encourage stool softeners, added simethicone.  And repeat x-ray tomorrow. 2.  Hiccups: Likely secondary to gastric distention: Continue Thorazine as needed. 3.  Essential hypertension: Controlled.  Continue present BP medicines  For right TKA: Management as per Ortho, limit narcotic use as much as possible to avoid nausea vomiting constipation constipation.. Wife  Told me that patient cannot take a Dilaudid.  DVT Prophylaxis   AM Labs Ordered, also please review Full Orders  Family Communication: Discussed with patient and his wife. Total time spent on this consult 55 minutes. Thank you for the consult, we will follow  the patient with you in the Hospital.   Epifanio Lesches M.D on 08/19/2017 at 7:53 PM  Note: This  dictation was prepared with Dragon dictation along with smaller phrase technology. Any transcriptional errors that result from this process are unintentional.

## 2017-08-19 NOTE — Clinical Social Work Note (Addendum)
Clinical Social Work Assessment  Patient Details  Name: Jacob TIPPETT MRN: [id number] Date of Birth: November 02, 1960  Date of referral:  08/19/17               Reason for consult:  Facility Placement                Permission sought to share information with:    Permission granted to share information::     Name::        Agency::     Relationship::     Contact Information:     Housing/Transportation Living arrangements for the past 2 months:  Single Family Home Source of Information:  Patient Patient Interpreter Needed:  None Criminal Activity/Legal Involvement Pertinent to Current Situation/Hospitalization:  No - Comment as needed Significant Relationships:  Spouse Lives with:  Spouse Do you feel safe going back to the place where you live?  Yes Need for family participation in patient care:  Yes (Comment)  Care giving concerns:  Patient lives in [place] with his wife Jenny Reichmann.    Social Worker assessment / plan:  Holiday representative (CSW) received SNF consult. PT is recommended home health on post op day 1 and changed recommendation to SNF on post op day 2. CSW met with patient alone at bedside to address consult. Patient was alert and oriented X4 and was laying in the bed. CSW introduced self and explained role of CSW department. Patient reported that he lives in [place] with his wife Jenny Reichmann and still works full time. CSW explained SNF process and that [facility] will have to approve SNF. Patient refused SNF and stated that he is going home with home health. CSW explained that patient has private insurance that requires authorization, which takes several days sometimes so it will be difficult to get to SNF if patient changes his mind. Patient continued to refuse SNF and stated that he will go home. Patient reported that his wife and another family member will be at home with him. Per patient he lives in a 1 story home and has 5 steps to get into the house. Patient's wife Jenny Reichmann is aware of  above. RN case manager aware of above. CSW will continue to follow and assist as needed.    Employment status:  Therapist, music:  Managed Care PT Recommendations:  [facility], Home with [facility] / Referral to community resources:  Other (Comment Required)(Patient refused SNF. )  Patient/Family's Response to care:  Patient refused SNF.   Patient/Family's Understanding of and Emotional Response to Diagnosis, Current Treatment, and Prognosis:  Patient was pleasant and thanked CSW for assistance.   Emotional Assessment Appearance:  Appears stated age Attitude/Demeanor/Rapport:    Affect (typically observed):  Accepting, Adaptable, Pleasant Orientation:  Oriented to Self, Oriented to Place, Oriented to  Time, Oriented to Situation Alcohol / Substance use:  Not Applicable Psych involvement (Current and /or in the community):  No (Comment)  Discharge Needs  Concerns to be addressed:  Discharge Planning Concerns Readmission within the last 30 days:  No Current discharge risk:  Dependent with Mobility Barriers to Discharge:  Continued Medical Work up   [employer], Veronia Beets, LCSW 08/19/2017, 11:09 AM

## 2017-08-19 NOTE — Care Management (Signed)
Have made several attempts over the past two days to see patient but he has always been with Pt or need vomiting. Finally able to see patient today and he ask that I call his wife Michaell Grider at work. Tc to [facility] at [phone]. Left message for return call.

## 2017-08-19 NOTE — Progress Notes (Signed)
Physical Therapy Treatment Patient Details Name: Jacob Marks MRN: [id number] DOB: April 05, 1961 Today's Date: 08/19/2017    History of Present Illness Pt is a 57 yo M diagnosed with OA of the R knee and is s/p elective R TKA.  PMH includes: asthma, OSA, CAD, MI, HTN, CVA, anxiety/depression, GERD, and kidney CA.      PT Comments    Pt presents with deficits in strength, transfers, mobility, gait, balance, R knee ROM, and activity tolerance.  Pt continues to be limited by nausea and required frequent therapeutic rest breaks during the session secondary to nausea.  Pt ambulated 40' but with effortful steps and decreased stance time on the RLE with near hopping at the end of amb secondary to RLE pain with weight bearing.  Pt steady with transfers but continues to require cues for proper sequencing.  Overall pt's progress has been very slow with gait remaining limited and effortful.  Pt will benefit from PT services in a SNF setting upon discharge to safely address above deficits for decreased caregiver assistance and eventual return to PLOF.        Follow Up Recommendations  SNF     Equipment Recommendations       Recommendations for Other Services       Precautions / Restrictions Precautions Precautions: Knee Required Braces or Orthoses: Other Brace/Splint Other Brace/Splint: Patellar stabilizer brace donned at all times Restrictions Weight Bearing Restrictions: Yes RLE Weight Bearing: Weight bearing as tolerated    Mobility  Bed Mobility Overal bed mobility: Modified Independent             General bed mobility comments: Extra time and effort but no physical assistance required during bed mobility tasks  Transfers Overall transfer level: Needs assistance Equipment used: Rolling walker (2 wheeled) Transfers: Sit to/from Stand Sit to Stand: Supervision         General transfer comment: Min verbal cues for sequencing with good eccentric and eccentric control    Ambulation/Gait Ambulation/Gait assistance: Min guard Ambulation Distance (Feet): 40 Feet, 90 deg R turn training to prevent CKC twisting on the R knee  Assistive device: Rolling walker (2 wheeled) Gait Pattern/deviations: Step-to pattern;Decreased step length - left;Decreased stance time - right Gait velocity: Decreased   General Gait Details: Pt steady with amb with antalgic, step-to gait pattern but remains limited by nausea, nursing aware   Stairs             Wheelchair Mobility    Modified Rankin (Stroke Patients Only)       Balance Overall balance assessment: Needs assistance   Sitting balance-Leahy Scale: Normal     Standing balance support: Bilateral upper extremity supported Standing balance-Leahy Scale: Good                              Cognition Arousal/Alertness: Awake/alert Behavior During Therapy: WFL for tasks assessed/performed Overall Cognitive Status: Within Functional Limits for tasks assessed                                        Exercises Total Joint Exercises Ankle Circles/Pumps: Strengthening;Both;5 reps;10 reps Quad Sets: Strengthening;Both;10 reps;15 reps Gluteal Sets: Strengthening;Both;10 reps;15 reps Heel Slides: AROM;Right;5 reps Hip ABduction/ADduction: AROM;Right;5 reps Straight Leg Raises: AROM;Right;5 reps Long Arc Quad: AROM;Right;10 reps;15 reps Knee Flexion: AROM;Right;10 reps;15 reps Goniometric ROM: R knee AROM: 9-98  deg Marching in Standing: AROM;Both;10 reps    General Comments        Pertinent Vitals/Pain Pain Assessment: 0-10 Pain Score: 3  Pain Location: R knee  Pain Descriptors / Indicators: Aching;Operative site guarding;Sore Pain Intervention(s): Premedicated before session;Monitored during session;Limited activity within patient's tolerance    Home Living                      Prior Function            PT Goals (current goals can now be found in the care  plan section) Progress towards PT goals: Not progressing toward goals - comment(Limited by nausea)    Frequency    BID      PT Plan Discharge plan needs to be updated    Co-evaluation              AM-PAC PT "6 Clicks" Daily Activity  Outcome Measure                   End of Session Equipment Utilized During Treatment: Gait belt Activity Tolerance: Other (comment)(Pt limited by nausea) Patient left: in bed;with bed alarm set;with call bell/phone within reach;with SCD's reapplied;Other (comment)(Polar care to R knee) Nurse Communication: Mobility status PT Visit Diagnosis: Muscle weakness (generalized) (M62.81);Other abnormalities of gait and mobility (R26.89)     Time: 7591-6384 PT Time Calculation (min) (ACUTE ONLY): 38 min  Charges:  $Gait Training: 8-22 mins $Therapeutic Exercise: 23-37 mins                    G Codes:       DRoyetta Asal PT, DPT 08/19/17, 12:13 PM

## 2017-08-20 ENCOUNTER — Inpatient Hospital Stay: Payer: BC Managed Care – PPO

## 2017-08-20 MED ORDER — LOPERAMIDE HCL 2 MG PO CAPS: 4.0000 mg | ORAL_CAPSULE | ORAL | Status: DC | PRN

## 2017-08-20 NOTE — Progress Notes (Signed)
Pt has denied nausea today, has been passing gas and had BM. Pt was able to eat a small amount of mac and cheese and mashed potatoes and soup for lunch with no issues. Pt's wound vac switched to prevena, VSS, PIV removed. Reviewed discharge instructions and prescriptions with pt and his wife, all questions answered. Pt assisted to car by RN.  Jacob Marks, Jacob Marks

## 2017-08-20 NOTE — Progress Notes (Signed)
Physical Therapy Treatment Patient Details Name: Jacob Marks MRN: [id number] DOB: [birth year] Today's Date: 08/20/2017    History of Present Illness 57 yo M with OA of the R knee and is s/p elective R TKA.  PMH: includes: asthma, OSA, CAD, MI, HTN, CVA, anxiety/depression, GERD, and kidney CA.      PT Comments    Pt did very well with PT session today and was able to ambulate much farther (~150 ft) than any previous attempt, was able to negotiate steps, had improved ROM and showed increased tolerance with WBing and did not have any nausea, etc.  Pt showed appropriate mobility, gait, strength, etc to be able to return home with HHPT once medically cleared.  Pt with pain and discomfort t/o the session, but generally feeling much better than he has been the last few days.     Follow Up Recommendations  Home health PT     Equipment Recommendations  Other (comment);Rolling walker with 5" wheels;3in1 (PT)    Recommendations for Other Services       Precautions / Restrictions Precautions Precautions: Knee Required Braces or Orthoses: Other Brace/Splint Other Brace/Splint: Patellar stabilizer brace donned at all times Restrictions RLE Weight Bearing: Weight bearing as tolerated    Mobility  Bed Mobility               General bed mobility comments: Pt able to rise to sitting w/o assist, good overall confidence and execution  Transfers Overall transfer level: Modified independent Equipment used: Rolling walker (2 wheeled) Transfers: Sit to/from Stand Sit to Stand: Supervision         General transfer comment: Pt able to rise to standing and return to sitting in controlled manner w/o assist  Ambulation/Gait Ambulation/Gait assistance: Supervision Ambulation Distance (Feet): 150 Feet Assistive device: Rolling walker (2 wheeled)       General Gait Details: Pt initially struggled to get heel down, work toward [employer], with increased time and cuing he was able to improve this  greatly and was actually able to maintain consistent walker motion w/o excessive UE use.  Pt did much better with ambulation this session than any previous attempt, no nausea.   Stairs Stairs: Yes Stairs assistance: Supervision Stair Management: One rail Right Number of Stairs: 8 General stair comments: Pt able to negotiate up/down steps with good use of UEs and no need for direct physical assist.    Wheelchair Mobility    Modified Rankin (Stroke Patients Only)       Balance Overall balance assessment: Modified Independent                                          Cognition Arousal/Alertness: Awake/alert Behavior During Therapy: WFL for tasks assessed/performed Overall Cognitive Status: Within Functional Limits for tasks assessed                                        Exercises Total Joint Exercises Ankle Circles/Pumps: Strengthening;10 reps Quad Sets: Strengthening;15 reps Gluteal Sets: Strengthening;15 reps Heel Slides: Strengthening;10 reps Hip ABduction/ADduction: Strengthening;10 reps Long Arc Quad: Strengthening;10 reps Knee Flexion: PROM;10 reps Goniometric ROM: 2-101    General Comments        Pertinent Vitals/Pain Pain Assessment: 0-10 Pain Score: 7     Home Living  Prior Function            PT Goals (current goals can now be found in the care plan section) Progress towards PT goals: Progressing toward goals    Frequency    BID      PT Plan Discharge plan needs to be updated    Co-evaluation              AM-PAC PT "6 Clicks" Daily Activity  Outcome Measure  Difficulty turning over in bed (including adjusting bedclothes, sheets and blankets)?: None Difficulty moving from lying on back to sitting on the side of the bed? : None Difficulty sitting down on and standing up from a chair with arms (e.g., wheelchair, bedside commode, etc,.)?: None Help needed moving to and  from a bed to chair (including a wheelchair)?: None Help needed walking in hospital room?: None Help needed climbing 3-5 steps with a railing? : None 6 Click Score: 24    End of Session Equipment Utilized During Treatment: Gait belt Activity Tolerance: Patient tolerated treatment well Patient left: with call bell/phone within reach;in chair;with family/visitor present Nurse Communication: Mobility status PT Visit Diagnosis: Muscle weakness (generalized) (M62.81);Other abnormalities of gait and mobility (R26.89)     Time: 7408-1448 PT Time Calculation (min) (ACUTE ONLY): 41 min  Charges:  $Gait Training: 23-37 mins $Therapeutic Exercise: 8-22 mins                    G Codes:       Kreg Shropshire, DPT 08/20/2017, 10:47 AM

## 2017-08-20 NOTE — Progress Notes (Signed)
[facility] [place] NAME: Jacob Marks    MR#:  [id number]  DATE OF BIRTH:  April 11, 1961  SUBJECTIVE:  CHIEF COMPLAINT:  No chief complaint on file. No further emesis, patient with continued nausea and singultus, positive flatulence, positive bowel movements, wife at the bedside, tolerating clear liquids  REVIEW OF SYSTEMS:  CONSTITUTIONAL: No fever, fatigue or weakness.  EYES: No blurred or double vision.  EARS, NOSE, AND THROAT: No tinnitus or ear pain.  RESPIRATORY: No cough, shortness of breath, wheezing or hemoptysis.  CARDIOVASCULAR: No chest pain, orthopnea, edema.  GASTROINTESTINAL: No nausea, vomiting, diarrhea or abdominal pain.  GENITOURINARY: No dysuria, hematuria.  ENDOCRINE: No polyuria, nocturia,  HEMATOLOGY: No anemia, easy bruising or bleeding SKIN: No rash or lesion. MUSCULOSKELETAL: No joint pain or arthritis.   NEUROLOGIC: No tingling, numbness, weakness.  PSYCHIATRY: No anxiety or depression.   ROS  DRUG ALLERGIES:  No Known Allergies  VITALS:  Blood pressure 140/73, pulse 78, temperature 97.8 F (36.6 C), resp. rate 20, height 5\' 9"  (1.753 m), weight 120.2 kg (265 lb), SpO2 96 %.  PHYSICAL EXAMINATION:  GENERAL:  57-year-old patient lying in the bed with no acute distress.  EYES: Pupils equal, round, reactive to light and accommodation. No scleral icterus. Extraocular muscles intact.  HEENT: Head atraumatic, normocephalic. Oropharynx and nasopharynx clear.  NECK:  Supple, no jugular venous distention. No thyroid enlargement, no tenderness.  LUNGS: Normal breath sounds bilaterally, no wheezing, rales,rhonchi or crepitation. No use of accessory muscles of respiration.  CARDIOVASCULAR: S1, S2 normal. No murmurs, rubs, or gallops.  ABDOMEN: Soft, nontender, nondistended. Bowel sounds present. No organomegaly or mass.  EXTREMITIES: No pedal edema, cyanosis, or clubbing.  NEUROLOGIC: Cranial nerves II through XII  are intact. Muscle strength 5/5 in all extremities. Sensation intact. Gait not checked.  PSYCHIATRIC: The patient is alert and oriented x 3.  SKIN: No obvious rash, lesion, or ulcer.   Physical Exam LABORATORY PANEL:   CBC Recent Labs  Lab 08/19/17 0920  WBC 12.4*  HGB 13.6  HCT 39.2*  PLT 157   ------------------------------------------------------------------------------------------------------------------  Chemistries  Recent Labs  Lab 08/19/17 0920  NA 135  K 3.9  CL 102  CO2 25  GLUCOSE 163*  BUN 17  CREATININE 0.78  CALCIUM 8.5*   ------------------------------------------------------------------------------------------------------------------  Cardiac Enzymes No results for input(s): TROPONINI in the last 168 hours. ------------------------------------------------------------------------------------------------------------------  RADIOLOGY:  Dg Abd 1 View  Result Date: 08/20/2017 CLINICAL DATA:  Evaluate ileus. EXAM: ABDOMEN - 1 VIEW COMPARISON:  08/19/2017 FINDINGS: Gaseous distention of large and small bowel again noted, not significantly changed since prior study. No free air organomegaly. IMPRESSION: Stable ileus pattern. Electronically Signed   By: Rolm Baptise M.D.   On: 08/20/2017 09:17   Dg Abd 1 View  Result Date: 08/19/2017 CLINICAL DATA:  Nausea and vomiting since knee replacement 2 days ago EXAM: ABDOMEN - 1 VIEW COMPARISON:  11/26/2016 FINDINGS: Diffuse gaseous distention of colon. A few gas dilated small bowel loops are seen. Gas reaches the distal sigmoid. Mild right lower lobe atelectasis. IMPRESSION: Diffuse gaseous distention of colon compatible with ileus. Electronically Signed   By: Monte Fantasia M.D.   On: 08/19/2017 16:18    ASSESSMENT AND PLAN:  Status post total knee replacement using cement, right  57 year old male patient with history right knee osteoarthritis status post elective right total knee arthroplasty by Dr. Rudene Christians on April 30  th  *Acute ileus  Resolved  Patient with continued nausea and singultus, but tolerated clear liquid diets, no emesis, positive bowel movements/flatulence  Okay for disposition home later today from medical standpoint, patient advised as well as his wife for continuation of bland diet/liquids with advancement as tolerated in the next 1 to 2 days, safe for discharge to home from medicine standpoint if patient tolerates lunch well  Continue stool softeners, antiemetics as needed   *Acute singultus  Resolved  Treated with Thorazine   *Chronic benign essential hypertension  Stable on current regiment   *Status post right total knee replacement  Postoperative care per primary service    All the records are reviewed and case discussed with Care Management/Social Workerr. Management plans discussed with the patient, family and they are in agreement.  CODE STATUS: full  TOTAL TIME TAKING CARE OF THIS PATIENT: 35 minutes.     POSSIBLE D/C IN 0-1 DAYS, DEPENDING ON CLINICAL CONDITION.   Avel Peace Governor Matos M.D on 08/20/2017   Between 7am to 6pm - Pager - [phone]  After 6pm go to [email or web address] - password [email or web address] [facility]  Office  [phone]  CC: Primary care physician; Tower, Wynelle Fanny, MD  Note: This dictation was prepared with Dragon dictation along with smaller phrase technology. Any transcriptional errors that result from this process are unintentional.

## 2017-08-20 NOTE — Progress Notes (Signed)
Subjective: 3 Days Post-Op Procedure(s) (LRB): TOTAL KNEE ARTHROPLASTY (Right) Patient reports pain as mild.   Patient is well, but has had some minor complaints of nausea/vomiting.  Nausea improving from yesterday.  He denies any abdominal pain.  Patient has had a bowel movement. Denies any CP, SOB. We will continue therapy today.  Plan is to go Home after hospital stay.  Objective: Vital signs in last 24 hours: Temp:  [98.3 F (36.8 C)-98.5 F (36.9 C)] 98.5 F (36.9 C) (05/02 2220) Pulse Rate:  [84-101] 101 (05/02 2220) BP: (131-146)/(73-80) 131/80 (05/02 2220) SpO2:  [95 %-97 %] 97 % (05/02 2220)  Intake/Output from previous day: 05/02 0701 - 05/03 0700 In: 720 [P.O.:720] Out: 0  Intake/Output this shift: Total I/O In: 480 [P.O.:480] Out: -   Recent Labs    08/17/17 1134 08/18/17 0254 08/19/17 0920  HGB 14.3 13.9 13.6   Recent Labs    08/18/17 0254 08/19/17 0920  WBC 12.2* 12.4*  RBC 4.50 4.47  HCT 39.9* 39.2*  PLT 171 157   Recent Labs    08/18/17 0254 08/19/17 0920  NA 137 135  K 4.7 3.9  CL 106 102  CO2 25 25  BUN 20 17  CREATININE 0.94 0.78  GLUCOSE 159* 163*  CALCIUM 8.2* 8.5*   No results for input(s): LABPT, INR in the last 72 hours.  EXAM General - Patient is Alert, Appropriate and Oriented  Abdomen-soft nontender nondistended normal bowel sounds Extremity - Neurovascular intact Sensation intact distally Intact pulses distally Dorsiflexion/Plantar flexion intact No cellulitis present Compartment soft Dressing - dressing C/D/I and no drainage lateral J brace intact.,  Wound VAC intact without drainage Motor Function - intact, moving foot and toes well on exam.  Ambulated 40 feet.  Past Medical History:  Diagnosis Date  . Arthritis    osteoarthritis of right knee  . Cancer ([place])    kidney  . Childhood asthma   . Chronic kidney disease    renal cyst right  . Coronary artery disease   . CVA (cerebral infarction) 10/2010   after his hemicolectomy; "some paralysis right side of mouth and slight speech problem as a result" (05/07/2014)  . Diverticulosis 7/12   with hemicolectomy-- complications   . ED (erectile dysfunction)   . Heart murmur    as a child  . History of kidney cancer    lesion on the right kidney  . History of kidney stones   . Hyperlipidemia   . Hypertension   . Kidney stones   . OSA (obstructive sleep apnea)    "has CPAP; doesn't wear it" (05/07/2014)  . Pes planus   . STEMI (ST elevation myocardial infarction) ([place]) 07/2012  . Stroke ([place])    slight right sided weakness - in face   . Testosterone deficiency     Assessment/Plan:   3 Days Post-Op Procedure(s) (LRB): TOTAL KNEE ARTHROPLASTY (Right) Active Problems:   Status post total knee replacement using cement, right  Estimated body mass index is 39.13 kg/m as calculated from the following:   Height as of this encounter: 5\' 9"  (1.753 m).   Weight as of this encounter: 120.2 kg (265 lb). Advance diet Up with therapy  Nausea: Continue with Reglan, Zofran.  Medicine following. Care management to assist with discharge to home with home health PT. Plan on discharge to home with home health PT on Friday pending progress with PT and nausea  DVT Prophylaxis - Lovenox, Foot Pumps and TED hose Weight-Bearing  as tolerated to right leg with lateral J brace   T. Rachelle Hora, PA-C [place] 08/20/2017, 6:26 AM

## 2017-08-20 NOTE — Care Management (Addendum)
Patient is discharging home today. No other RNCM needs. [facility] has been updated.

## 2017-08-20 NOTE — Care Management (Signed)
Abdominal xray today; GI problems per wife. Wife said MD consult to evaluate. Poor PO per wife. States had had BM "but has ascites".  She does not expect to discharge today. I have notified [facility] of discharge delay. Gilford Rile has been delivered by [facility]. RNCM to follow. Per RNCM note Lovenox 30$.

## 2017-08-20 NOTE — Progress Notes (Signed)
Physical Therapy Treatment Patient Details Name: Jacob Marks MRN: [id number] DOB: 10-16-1960 Today's Date: 08/20/2017    History of Present Illness 57 yo M with OA of the R knee and is s/p elective R TKA.  PMH: includes: asthma, OSA, CAD, MI, HTN, CVA, anxiety/depression, GERD, and kidney CA.      PT Comments    Pt showed great effort with PT session and was able to circumambulate the nurses' station with good safety and confidence.  He still is having a lot of quad weakness (unable to lift against gravity AROM with SAQ) with occasionally "catching" in knee cap during flexion to extension activities.  Pt overall did well and is functionally safe to go home, did encourage him to really work on quad engagement and positioning of LE.    Follow Up Recommendations  Home health PT     Equipment Recommendations       Recommendations for Other Services       Precautions / Restrictions Precautions Precautions: Knee Restrictions RLE Weight Bearing: Weight bearing as tolerated    Mobility  Bed Mobility               General bed mobility comments: Pt able to rise to sitting w/o assist, good overall confidence and execution.  Did need light UE assist to get R LE back up into bed  Transfers Overall transfer level: Independent Equipment used: Rolling walker (2 wheeled) Transfers: Sit to/from Stand Sit to Stand: Modified independent (Device/Increase time)         General transfer comment: Pt able to rise to standing and return to sitting in controlled manner w/o assist  Ambulation/Gait Ambulation/Gait assistance: Supervision Ambulation Distance (Feet): 200 Feet Assistive device: Rolling walker (2 wheeled)       General Gait Details: Pt continues to show increased confidence, speed and cadence with ambulation.  After ~25 ft of hesitant, step-to pattern pt was able to work toward consistent walker movement and cadence with near TKE/heel strike .  No buckling, LOBs or other  safety concerns.   Stairs             Wheelchair Mobility    Modified Rankin (Stroke Patients Only)       Balance Overall balance assessment: Modified Independent                                          Cognition Arousal/Alertness: Awake/alert Behavior During Therapy: WFL for tasks assessed/performed Overall Cognitive Status: Within Functional Limits for tasks assessed                                        Exercises Total Joint Exercises Ankle Circles/Pumps: Strengthening;10 reps Quad Sets: Strengthening;15 reps Gluteal Sets: Strengthening;15 reps Short Arc Quad: AAROM;10 reps(Pt is unable to initiate against gravity knee extension) Heel Slides: Strengthening;10 reps Hip ABduction/ADduction: Strengthening;10 reps Straight Leg Raises: AAROM;10 reps    General Comments        Pertinent Vitals/Pain Pain Score: 6     Home Living                      Prior Function            PT Goals (current goals can now be found in the care plan section)  Progress towards PT goals: Progressing toward goals    Frequency    BID      PT Plan Current plan remains appropriate    Co-evaluation              AM-PAC PT "6 Clicks" Daily Activity  Outcome Measure  Difficulty turning over in bed (including adjusting bedclothes, sheets and blankets)?: None Difficulty moving from lying on back to sitting on the side of the bed? : None Difficulty sitting down on and standing up from a chair with arms (e.g., wheelchair, bedside commode, etc,.)?: None Help needed moving to and from a bed to chair (including a wheelchair)?: None Help needed walking in hospital room?: None Help needed climbing 3-5 steps with a railing? : None 6 Click Score: 24    End of Session Equipment Utilized During Treatment: Gait belt Activity Tolerance: Patient tolerated treatment well Patient left: with bed alarm set;with call bell/phone within  reach;with family/visitor present Nurse Communication: Mobility status PT Visit Diagnosis: Muscle weakness (generalized) (M62.81);Other abnormalities of gait and mobility (R26.89)     Time: 1430-1502 PT Time Calculation (min) (ACUTE ONLY): 32 min  Charges:  $Gait Training: 8-22 mins $Therapeutic Exercise: 8-22 mins                    G Codes:       Kreg Shropshire, DPT 08/20/2017, 3:39 PM

## 2017-08-26 ENCOUNTER — Telehealth: Payer: Self-pay | Admitting: Family Medicine

## 2017-08-26 NOTE — Telephone Encounter (Signed)
Copied from [place] [id number]. Topic: Quick Communication - See Telephone Encounter >> Aug 26, 2017 10:44 AM Ivar Drape wrote: CRM for notification. See Telephone encounter for: 08/26/17. Wes a Physical Therapist w/[facility] [phone] stated the patient has been diagnosed with a knee replacement.  The referral paperwork included a diagnosis of dysphagia.  So they were wondering if this was a late effect of a stroke he had previously.

## 2017-08-26 NOTE — Telephone Encounter (Signed)
Wes, PT notified of Dr. Marliss Coots comments. Wes will check with neuro

## 2017-08-26 NOTE — Telephone Encounter (Signed)
I suspect so given locale of his stroke-but I have not seen him in quite a while  Pt and family may know His neurologist is Dr Jacelyn Grip  Thanks

## 2017-08-26 NOTE — Telephone Encounter (Signed)
Pt was last seen by Dr Glori Bickers 09/04/2013; pt last seen for acute on 05/12/17. No future appt scheduled.

## 2017-09-27 ENCOUNTER — Other Ambulatory Visit: Payer: Self-pay

## 2017-09-27 ENCOUNTER — Telehealth: Payer: Self-pay | Admitting: Cardiovascular Disease

## 2017-09-27 MED ORDER — LISINOPRIL 10 MG PO TABS: 10.0000 mg | ORAL_TABLET | Freq: Every day | ORAL | 3 refills | Status: DC

## 2017-09-27 MED ORDER — METOPROLOL TARTRATE 25 MG PO TABS: ORAL_TABLET | ORAL | 3 refills | Status: DC

## 2017-09-27 NOTE — Telephone Encounter (Signed)
90 Day refills sent to [facility] in [place].

## 2017-09-27 NOTE — Telephone Encounter (Signed)
°*  STAT* If patient is at the pharmacy, call can be transferred to refill team.   1. Which medications need to be refilled? (please list name of each medication and dose if known)     Lisinopril 10 mg po q d     Metoprolol 25 mg po BID  2. Which pharmacy/location (including street and city if local pharmacy) is medication to be sent to?     [facility] [address] [place]   3. Do they need a 30 day or 90 day supply? [place]

## 2017-10-26 ENCOUNTER — Encounter
Admission: RE | Admit: 2017-10-26 | Discharge: 2017-10-26 | Disposition: A | Discharge status: Home / Self Care | Payer: BC Managed Care – PPO | Source: Ambulatory Visit | Attending: Orthopedic Surgery | Admitting: Orthopedic Surgery

## 2017-10-26 ENCOUNTER — Other Ambulatory Visit: Payer: Self-pay

## 2017-10-26 DIAGNOSIS — Z01818 Encounter for other preprocedural examination: Secondary | ICD-10-CM | POA: Diagnosis present

## 2017-10-26 NOTE — Patient Instructions (Signed)
  Your procedure is scheduled on: Thursday October 28, 2017 Report to Same Day Surgery 2nd floor medical mall ([place] Entrance-take elevator on left to 2nd floor.  Check in with surgery information desk.) To find out your arrival time please call [phone] between 1PM - 3PM on Wednesday October 27, 2017  Remember: Instructions that are not followed completely may result in serious medical risk, up to and including death, or upon the discretion of your surgeon and anesthesiologist your surgery may need to be rescheduled.    _x___ 1. Do not eat food after midnight the night before your procedure. You may drink clear liquids up to 2 hours before you are scheduled to arrive at the hospital for your procedure.  Do not drink clear liquids within 2 hours of your scheduled arrival to the hospital.  Clear liquids include  --Water or Apple juice without pulp  --Clear carbohydrate beverage such as Gatorade  --Black Coffee or Clear Tea (No milk, no creamers, do not add anything to the coffee or tea   No gum chewing or hard candies.      __x__ 2. No Alcohol for 24 hours before or after surgery.   __x__3. No Smoking or e-cigarettes for 24 prior to surgery.  Do not use any chewable tobacco products for at least 6 hour prior to surgery   ____  4. Bring all medications with you on the day of surgery if instructed.    __x__ 5. Notify your doctor if there is any change in your medical condition     (cold, fever, infections).   __x__6. On the morning of surgery brush your teeth with toothpaste and water.  You may rinse your mouth with mouth wash if you wish.  Do not swallow any toothpaste or mouthwash.   Do not wear jewelry, make-up, hairpins, clips or nail polish.  Do not wear lotions, powders, deodorant, or perfumes.   Do not shave 48 hours prior to surgery.   Do not bring valuables to the hospital.    [facility] is not responsible for any belongings or valuables.               Contacts,  dentures or bridgework may not be worn into surgery.  Leave your suitcase in the car. After surgery it may be brought to your room.  For patients admitted to the hospital, discharge time is determined by your treatment team.  Please read over the following fact sheets that you were given:   [facility] Preparing for Surgery and or MRSA Information   _x___ Take anti-hypertensive listed below, cardiac, seizure, asthma, anti-reflux and psychiatric medicines. These include:  1. Metoprolol/ Lopressor  No Lisinopril/Prinivil on day of surgery.  _x___ Use CHG Soap or sage wipes as directed on instruction sheet   _x___ Follow recommendations from Cardiologist, Pulmonologist or PCP regarding stopping Aspirin, Coumadin, Plavix ,Eliquis, Effient, or Pradaxa, and Pletal.  _x___ NOW: Stop Anti-inflammatories such as Advil, Aleve, Ibuprofen, Motrin, Naproxen, Naprosyn, Goodies powders or aspirin products. OK to take Tylenol and Celebrex.   _x___ Stop supplements until after surgery.  But may continue Vitamin D, Vitamin B, and multivitamin.

## 2017-10-28 ENCOUNTER — Ambulatory Visit: Payer: BC Managed Care – PPO | Admitting: Anesthesiology

## 2017-10-28 ENCOUNTER — Observation Stay
Admission: RE | Admit: 2017-10-28 | Discharge: 2017-10-29 | Disposition: A | Discharge status: Home / Self Care | Payer: BC Managed Care – PPO | Source: Ambulatory Visit | Attending: Orthopedic Surgery | Admitting: Orthopedic Surgery

## 2017-10-28 ENCOUNTER — Encounter
Admission: RE | Disposition: A | Discharge status: Home / Self Care | Payer: Self-pay | Source: Ambulatory Visit | Attending: Orthopedic Surgery

## 2017-10-28 ENCOUNTER — Encounter: Payer: Self-pay | Admitting: *Deleted

## 2017-10-28 ENCOUNTER — Other Ambulatory Visit: Payer: Self-pay

## 2017-10-28 DIAGNOSIS — F329 Major depressive disorder, single episode, unspecified: Secondary | ICD-10-CM | POA: Insufficient documentation

## 2017-10-28 DIAGNOSIS — Z885 Allergy status to narcotic agent status: Secondary | ICD-10-CM | POA: Diagnosis not present

## 2017-10-28 DIAGNOSIS — Z7982 Long term (current) use of aspirin: Secondary | ICD-10-CM | POA: Diagnosis not present

## 2017-10-28 DIAGNOSIS — Z888 Allergy status to other drugs, medicaments and biological substances status: Secondary | ICD-10-CM | POA: Diagnosis not present

## 2017-10-28 DIAGNOSIS — Z79899 Other long term (current) drug therapy: Secondary | ICD-10-CM | POA: Insufficient documentation

## 2017-10-28 DIAGNOSIS — J45909 Unspecified asthma, uncomplicated: Secondary | ICD-10-CM | POA: Insufficient documentation

## 2017-10-28 DIAGNOSIS — Z85528 Personal history of other malignant neoplasm of kidney: Secondary | ICD-10-CM | POA: Diagnosis not present

## 2017-10-28 DIAGNOSIS — Z6839 Body mass index (BMI) 39.0-39.9, adult: Secondary | ICD-10-CM | POA: Insufficient documentation

## 2017-10-28 DIAGNOSIS — K219 Gastro-esophageal reflux disease without esophagitis: Secondary | ICD-10-CM | POA: Insufficient documentation

## 2017-10-28 DIAGNOSIS — Z96651 Presence of right artificial knee joint: Secondary | ICD-10-CM | POA: Insufficient documentation

## 2017-10-28 DIAGNOSIS — I2510 Atherosclerotic heart disease of native coronary artery without angina pectoris: Secondary | ICD-10-CM | POA: Diagnosis not present

## 2017-10-28 DIAGNOSIS — G4733 Obstructive sleep apnea (adult) (pediatric): Secondary | ICD-10-CM | POA: Diagnosis not present

## 2017-10-28 DIAGNOSIS — X58XXXA Exposure to other specified factors, initial encounter: Secondary | ICD-10-CM | POA: Insufficient documentation

## 2017-10-28 DIAGNOSIS — S83001S Unspecified subluxation of right patella, sequela: Secondary | ICD-10-CM

## 2017-10-28 DIAGNOSIS — I69328 Other speech and language deficits following cerebral infarction: Secondary | ICD-10-CM | POA: Insufficient documentation

## 2017-10-28 DIAGNOSIS — I10 Essential (primary) hypertension: Secondary | ICD-10-CM | POA: Diagnosis not present

## 2017-10-28 DIAGNOSIS — N529 Male erectile dysfunction, unspecified: Secondary | ICD-10-CM | POA: Diagnosis not present

## 2017-10-28 DIAGNOSIS — E785 Hyperlipidemia, unspecified: Secondary | ICD-10-CM | POA: Diagnosis not present

## 2017-10-28 DIAGNOSIS — S83011A Lateral subluxation of right patella, initial encounter: Secondary | ICD-10-CM | POA: Diagnosis present

## 2017-10-28 DIAGNOSIS — Z9049 Acquired absence of other specified parts of digestive tract: Secondary | ICD-10-CM | POA: Diagnosis not present

## 2017-10-28 DIAGNOSIS — I252 Old myocardial infarction: Secondary | ICD-10-CM | POA: Insufficient documentation

## 2017-10-28 DIAGNOSIS — I69361 Other paralytic syndrome following cerebral infarction affecting right dominant side: Secondary | ICD-10-CM | POA: Insufficient documentation

## 2017-10-28 HISTORY — PX: KNEE ARTHROTOMY: SHX5881

## 2017-10-28 LAB — CBC
HCT: 42.3 % (ref 40.0–52.0)
HEMOGLOBIN: 14.4 g/dL (ref 13.0–18.0)
MCH: 30.1 pg (ref 26.0–34.0)
MCHC: 34.1 g/dL (ref 32.0–36.0)
MCV: 88.3 fL (ref 80.0–100.0)
PLATELETS: 206 10*3/uL (ref 150–440)
RBC: 4.8 MIL/uL (ref 4.40–5.90)
RDW: 14.7 % — ABNORMAL HIGH (ref 11.5–14.5)
WBC: 7.8 10*3/uL (ref 3.8–10.6)

## 2017-10-28 LAB — CREATININE, SERUM
Creatinine, Ser: 0.97 mg/dL (ref 0.61–1.24)
GFR calc Af Amer: >60 mL/min (ref >60)

## 2017-10-28 SURGERY — ARTHROTOMY, KNEE
Anesthesia: Spinal | Laterality: Right

## 2017-10-28 MED ORDER — SENNOSIDES-DOCUSATE SODIUM 8.6-50 MG PO TABS: 1.0000 | ORAL_TABLET | Freq: Every evening | ORAL | Status: DC | PRN

## 2017-10-28 MED ORDER — PROPOFOL 500 MG/50ML IV EMUL
INTRAVENOUS | Status: DC | PRN
  Administered 2017-10-28: 100 ug/kg/min via INTRAVENOUS

## 2017-10-28 MED ORDER — ONDANSETRON HCL 4 MG/2ML IJ SOLN: 4.0000 mg | Freq: Four times a day (QID) | INTRAMUSCULAR | Status: DC | PRN

## 2017-10-28 MED ORDER — FENTANYL CITRATE (PF) 100 MCG/2ML IJ SOLN
25.0000 ug | INTRAMUSCULAR | Status: DC | PRN
  Administered 2017-10-28 (×3): 25 ug via INTRAVENOUS

## 2017-10-28 MED ORDER — SCOPOLAMINE 1 MG/3DAYS TD PT72
1.0000 | MEDICATED_PATCH | TRANSDERMAL | Status: DC
  Administered 2017-10-28: 1.5 mg via TRANSDERMAL

## 2017-10-28 MED ORDER — HYDROCODONE-ACETAMINOPHEN 5-325 MG PO TABS
1.0000 | ORAL_TABLET | ORAL | Status: DC | PRN
  Administered 2017-10-28 – 2017-10-29 (×6): 2 via ORAL
  Administered 2017-10-29: 1 via ORAL
  Filled 2017-10-28 (×8): qty 2

## 2017-10-28 MED ORDER — PROPOFOL 10 MG/ML IV BOLUS
INTRAVENOUS | Status: DC | PRN
  Administered 2017-10-28: 30 mg via INTRAVENOUS
  Administered 2017-10-28: 20 mg via INTRAVENOUS

## 2017-10-28 MED ORDER — MIDAZOLAM HCL 2 MG/2ML IJ SOLN
INTRAMUSCULAR | Status: AC
  Filled 2017-10-28: qty 2

## 2017-10-28 MED ORDER — CEFAZOLIN SODIUM-DEXTROSE 2-4 GM/100ML-% IV SOLN
INTRAVENOUS | Status: AC
  Filled 2017-10-28: qty 100

## 2017-10-28 MED ORDER — ZOLPIDEM TARTRATE 5 MG PO TABS
5.0000 mg | ORAL_TABLET | Freq: Every evening | ORAL | Status: DC | PRN
  Administered 2017-10-28: 5 mg via ORAL
  Filled 2017-10-28: qty 1

## 2017-10-28 MED ORDER — BUPIVACAINE LIPOSOME 1.3 % IJ SUSP
INTRAMUSCULAR | Status: AC
  Filled 2017-10-28: qty 20

## 2017-10-28 MED ORDER — DOCUSATE SODIUM 100 MG PO CAPS
100.0000 mg | ORAL_CAPSULE | ORAL | Status: DC | PRN
  Administered 2017-10-28: 100 mg via ORAL

## 2017-10-28 MED ORDER — DIPHENHYDRAMINE HCL 12.5 MG/5ML PO ELIX: 12.5000 mg | ORAL_SOLUTION | ORAL | Status: DC | PRN

## 2017-10-28 MED ORDER — FENTANYL CITRATE (PF) 100 MCG/2ML IJ SOLN
INTRAMUSCULAR | Status: AC
  Filled 2017-10-28: qty 2

## 2017-10-28 MED ORDER — CEFAZOLIN SODIUM-DEXTROSE 2-4 GM/100ML-% IV SOLN: 2.0000 g | Freq: Once | INTRAVENOUS | Status: DC

## 2017-10-28 MED ORDER — DEXMEDETOMIDINE HCL 200 MCG/2ML IV SOLN
INTRAVENOUS | Status: DC | PRN
  Administered 2017-10-28: 12 ug via INTRAVENOUS

## 2017-10-28 MED ORDER — ATORVASTATIN CALCIUM 20 MG PO TABS
40.0000 mg | ORAL_TABLET | Freq: Every day | ORAL | Status: DC
  Administered 2017-10-28 – 2017-10-29 (×2): 40 mg via ORAL
  Filled 2017-10-28 (×3): qty 2

## 2017-10-28 MED ORDER — PROPOFOL 500 MG/50ML IV EMUL
INTRAVENOUS | Status: AC
  Filled 2017-10-28: qty 50

## 2017-10-28 MED ORDER — HYDROCODONE-ACETAMINOPHEN 7.5-325 MG PO TABS: 1.0000 | ORAL_TABLET | ORAL | Status: DC | PRN

## 2017-10-28 MED ORDER — ASPIRIN EC 81 MG PO TBEC
162.0000 mg | DELAYED_RELEASE_TABLET | Freq: Every day | ORAL | Status: DC
  Administered 2017-10-28 – 2017-10-29 (×2): 162 mg via ORAL
  Filled 2017-10-28 (×2): qty 2

## 2017-10-28 MED ORDER — ONDANSETRON HCL 4 MG PO TABS: 4.0000 mg | ORAL_TABLET | Freq: Four times a day (QID) | ORAL | Status: DC | PRN

## 2017-10-28 MED ORDER — DOCUSATE SODIUM 100 MG PO CAPS
100.0000 mg | ORAL_CAPSULE | Freq: Two times a day (BID) | ORAL | Status: DC
  Administered 2017-10-28 – 2017-10-29 (×2): 100 mg via ORAL
  Filled 2017-10-28 (×3): qty 1

## 2017-10-28 MED ORDER — METHOCARBAMOL 1000 MG/10ML IJ SOLN
500.0000 mg | Freq: Four times a day (QID) | INTRAVENOUS | Status: DC | PRN
  Filled 2017-10-28: qty 5

## 2017-10-28 MED ORDER — NEOMYCIN-POLYMYXIN B GU 40-200000 IR SOLN
Status: AC
  Filled 2017-10-28: qty 20

## 2017-10-28 MED ORDER — MAGNESIUM CITRATE PO SOLN
1.0000 | Freq: Once | ORAL | Status: DC | PRN
  Filled 2017-10-28: qty 296

## 2017-10-28 MED ORDER — BISACODYL 5 MG PO TBEC: 5.0000 mg | DELAYED_RELEASE_TABLET | Freq: Every day | ORAL | Status: DC | PRN

## 2017-10-28 MED ORDER — METOCLOPRAMIDE HCL 5 MG/ML IJ SOLN
5.0000 mg | Freq: Three times a day (TID) | INTRAMUSCULAR | Status: DC | PRN
  Administered 2017-10-28: 10 mg via INTRAVENOUS
  Filled 2017-10-28: qty 2

## 2017-10-28 MED ORDER — FAMOTIDINE 20 MG PO TABS
20.0000 mg | ORAL_TABLET | Freq: Once | ORAL | Status: AC
  Administered 2017-10-28: 20 mg via ORAL

## 2017-10-28 MED ORDER — ACETAMINOPHEN 500 MG PO TABS
500.0000 mg | ORAL_TABLET | Freq: Four times a day (QID) | ORAL | Status: AC
  Administered 2017-10-28 – 2017-10-29 (×2): 500 mg via ORAL
  Filled 2017-10-28 (×2): qty 1

## 2017-10-28 MED ORDER — LACTATED RINGERS IV SOLN
INTRAVENOUS | Status: DC
  Administered 2017-10-28: 07:00:00 via INTRAVENOUS

## 2017-10-28 MED ORDER — DEXMEDETOMIDINE HCL 200 MCG/2ML IV SOLN: INTRAVENOUS | Status: DC | PRN

## 2017-10-28 MED ORDER — ONDANSETRON HCL 4 MG/2ML IJ SOLN
INTRAMUSCULAR | Status: AC
  Filled 2017-10-28: qty 2

## 2017-10-28 MED ORDER — FENTANYL CITRATE (PF) 100 MCG/2ML IJ SOLN
INTRAMUSCULAR | Status: AC
  Administered 2017-10-28: 25 ug via INTRAVENOUS
  Filled 2017-10-28: qty 2

## 2017-10-28 MED ORDER — CEFAZOLIN SODIUM-DEXTROSE 2-3 GM-%(50ML) IV SOLR
INTRAVENOUS | Status: DC | PRN
  Administered 2017-10-28: 2 g via INTRAVENOUS

## 2017-10-28 MED ORDER — METOPROLOL TARTRATE 25 MG PO TABS
25.0000 mg | ORAL_TABLET | Freq: Two times a day (BID) | ORAL | Status: DC
  Administered 2017-10-28 – 2017-10-29 (×2): 25 mg via ORAL
  Filled 2017-10-28 (×2): qty 1

## 2017-10-28 MED ORDER — VITAMIN B-6 50 MG PO TABS
100.0000 mg | ORAL_TABLET | Freq: Every day | ORAL | Status: DC
  Administered 2017-10-28 – 2017-10-29 (×2): 100 mg via ORAL
  Filled 2017-10-28 (×2): qty 2

## 2017-10-28 MED ORDER — VITAMIN B-12 1000 MCG PO TABS
1000.0000 ug | ORAL_TABLET | Freq: Every day | ORAL | Status: DC
  Administered 2017-10-28 – 2017-10-29 (×2): 1000 ug via ORAL
  Filled 2017-10-28 (×2): qty 1

## 2017-10-28 MED ORDER — ONDANSETRON HCL 4 MG/2ML IJ SOLN: 4.0000 mg | Freq: Once | INTRAMUSCULAR | Status: DC | PRN

## 2017-10-28 MED ORDER — MIDAZOLAM HCL 5 MG/5ML IJ SOLN
INTRAMUSCULAR | Status: DC | PRN
  Administered 2017-10-28 (×2): 1 mg via INTRAVENOUS

## 2017-10-28 MED ORDER — MORPHINE SULFATE (PF) 2 MG/ML IV SOLN
0.5000 mg | INTRAVENOUS | Status: DC | PRN
  Administered 2017-10-28 (×2): 0.5 mg via INTRAVENOUS
  Filled 2017-10-28 (×2): qty 1

## 2017-10-28 MED ORDER — GLYCOPYRROLATE 0.2 MG/ML IJ SOLN
INTRAMUSCULAR | Status: AC
  Filled 2017-10-28: qty 1

## 2017-10-28 MED ORDER — AZELASTINE HCL 0.1 % NA SOLN
1.0000 | Freq: Every day | NASAL | Status: DC
  Administered 2017-10-28 – 2017-10-29 (×2): 1 via NASAL
  Filled 2017-10-28: qty 30

## 2017-10-28 MED ORDER — FLUTICASONE PROPIONATE 50 MCG/ACT NA SUSP
2.0000 | Freq: Every day | NASAL | Status: DC
  Administered 2017-10-28 – 2017-10-29 (×2): 2 via NASAL
  Filled 2017-10-28: qty 16

## 2017-10-28 MED ORDER — SCOPOLAMINE 1 MG/3DAYS TD PT72
MEDICATED_PATCH | TRANSDERMAL | Status: AC
  Filled 2017-10-28: qty 1

## 2017-10-28 MED ORDER — LORATADINE 10 MG PO TABS
10.0000 mg | ORAL_TABLET | Freq: Every day | ORAL | Status: DC
  Administered 2017-10-28 – 2017-10-29 (×2): 10 mg via ORAL
  Filled 2017-10-28 (×2): qty 1

## 2017-10-28 MED ORDER — ONDANSETRON HCL 4 MG/2ML IJ SOLN
INTRAMUSCULAR | Status: DC | PRN
  Administered 2017-10-28: 4 mg via INTRAVENOUS

## 2017-10-28 MED ORDER — METHOCARBAMOL 500 MG PO TABS: 500.0000 mg | ORAL_TABLET | Freq: Four times a day (QID) | ORAL | Status: DC | PRN

## 2017-10-28 MED ORDER — LIDOCAINE HCL (PF) 2 % IJ SOLN
INTRAMUSCULAR | Status: AC
  Filled 2017-10-28: qty 10

## 2017-10-28 MED ORDER — NITROGLYCERIN 0.4 MG SL SUBL: 0.4000 mg | SUBLINGUAL_TABLET | SUBLINGUAL | Status: DC | PRN

## 2017-10-28 MED ORDER — ZOLPIDEM TARTRATE 5 MG PO TABS: 5.0000 mg | ORAL_TABLET | Freq: Every evening | ORAL | Status: DC | PRN

## 2017-10-28 MED ORDER — METOCLOPRAMIDE HCL 10 MG PO TABS: 5.0000 mg | ORAL_TABLET | Freq: Three times a day (TID) | ORAL | Status: DC | PRN

## 2017-10-28 MED ORDER — ENOXAPARIN SODIUM 40 MG/0.4ML ~~LOC~~ SOLN
40.0000 mg | SUBCUTANEOUS | Status: DC
  Administered 2017-10-29: 40 mg via SUBCUTANEOUS
  Filled 2017-10-28: qty 0.4

## 2017-10-28 MED ORDER — LISINOPRIL 10 MG PO TABS
10.0000 mg | ORAL_TABLET | Freq: Every day | ORAL | Status: DC
  Administered 2017-10-29: 10 mg via ORAL
  Filled 2017-10-28: qty 1

## 2017-10-28 MED ORDER — CEFAZOLIN SODIUM-DEXTROSE 2-4 GM/100ML-% IV SOLN
2.0000 g | Freq: Four times a day (QID) | INTRAVENOUS | Status: AC
  Administered 2017-10-28 – 2017-10-29 (×3): 2 g via INTRAVENOUS
  Filled 2017-10-28 (×3): qty 100

## 2017-10-28 MED ORDER — TRAMADOL HCL 50 MG PO TABS
50.0000 mg | ORAL_TABLET | Freq: Four times a day (QID) | ORAL | Status: DC
  Administered 2017-10-28 – 2017-10-29 (×4): 50 mg via ORAL
  Filled 2017-10-28 (×4): qty 1

## 2017-10-28 MED ORDER — FAMOTIDINE 20 MG PO TABS
ORAL_TABLET | ORAL | Status: AC
  Administered 2017-10-28: 20 mg via ORAL
  Filled 2017-10-28: qty 1

## 2017-10-28 MED ORDER — SODIUM CHLORIDE 0.9 % IJ SOLN
INTRAMUSCULAR | Status: AC
  Filled 2017-10-28: qty 50

## 2017-10-28 MED ORDER — ACETAMINOPHEN 325 MG PO TABS: 325.0000 mg | ORAL_TABLET | Freq: Four times a day (QID) | ORAL | Status: DC | PRN

## 2017-10-28 MED ORDER — SODIUM CHLORIDE 0.9 % IV SOLN
INTRAVENOUS | Status: DC
  Administered 2017-10-28 (×2): via INTRAVENOUS

## 2017-10-28 SURGICAL SUPPLY — 67 items
ANCH SUT SWLK 19.1X4.75 (Anchor) ×1 IMPLANT
ANCHOR SUT BIO SW 4.75X19.1 (Anchor) ×2 IMPLANT
BANDAGE ACE 6X5 VEL STRL LF (GAUZE/BANDAGES/DRESSINGS) ×3 IMPLANT
BANDAGE ELASTIC 6 LF NS (GAUZE/BANDAGES/DRESSINGS) ×3 IMPLANT
BNDG CMPR MED 5X6 ELC HKLP NS (GAUZE/BANDAGES/DRESSINGS) ×1
CANISTER SUCT 1200ML W/VALVE (MISCELLANEOUS) ×3 IMPLANT
CANISTER SUCT 3000ML PPV (MISCELLANEOUS) ×6 IMPLANT
CHLORAPREP W/TINT 26ML (MISCELLANEOUS) ×3 IMPLANT
COOLER POLAR GLACIER W/PUMP (MISCELLANEOUS) ×1 IMPLANT
CUFF TOURN 24 STER (MISCELLANEOUS) IMPLANT
CUFF TOURN 30 STER DUAL PORT (MISCELLANEOUS) ×2 IMPLANT
ELECT CAUTERY BLADE 6.4 (BLADE) ×1 IMPLANT
ELECT REM PT RETURN 9FT ADLT (ELECTROSURGICAL) ×3
ELECTRODE REM PT RTRN 9FT ADLT (ELECTROSURGICAL) ×1 IMPLANT
GAUZE PETRO XEROFOAM 1X8 (MISCELLANEOUS) ×1 IMPLANT
GAUZE SPONGE 4X4 12PLY STRL (GAUZE/BANDAGES/DRESSINGS) ×1 IMPLANT
GLOVE BIOGEL PI IND STRL 9 (GLOVE) ×1 IMPLANT
GLOVE BIOGEL PI INDICATOR 9 (GLOVE) ×6
GLOVE INDICATOR 8.0 STRL GRN (GLOVE) ×5 IMPLANT
GLOVE SURG ORTHO 8.0 STRL STRW (GLOVE) ×7 IMPLANT
GLOVE SURG SYN 9.0  PF PI (GLOVE) ×2
GLOVE SURG SYN 9.0 PF PI (GLOVE) ×1 IMPLANT
GOWN SRG 2XL LVL 4 RGLN SLV (GOWNS) ×1 IMPLANT
GOWN STRL NON-REIN 2XL LVL4 (GOWNS) ×3
GOWN STRL REUS W/ TWL LRG LVL3 (GOWN DISPOSABLE) ×1 IMPLANT
GOWN STRL REUS W/TWL LRG LVL3 (GOWN DISPOSABLE) ×3
HOLDER FOLEY CATH W/STRAP (MISCELLANEOUS) ×3 IMPLANT
HOOD PEEL AWAY FLYTE STAYCOOL (MISCELLANEOUS) ×6 IMPLANT
IMMOB KNEE 24 THIGH 24 443303 (SOFTGOODS) ×1 IMPLANT
KIT PREVENA INCISION MGT20CM45 (CANNISTER) ×2 IMPLANT
KIT SUTURETAK 3.0 INSERT PERC (KITS) ×2 IMPLANT
KIT TURNOVER KIT A (KITS) ×3 IMPLANT
NDL MAYO CATGUT SZ5 (NEEDLE) ×3
NDL SAFETY ECLIPSE 18X1.5 (NEEDLE) ×1 IMPLANT
NDL SPNL 20GX3.5 QUINCKE YW (NEEDLE) ×1 IMPLANT
NDL SUT 5 .5 CRC TROC PNT MAYO (NEEDLE) IMPLANT
NEEDLE HYPO 18GX1.5 SHARP (NEEDLE) ×3
NEEDLE SPNL 20GX3.5 QUINCKE YW (NEEDLE) ×3 IMPLANT
NS IRRIG 1000ML POUR BTL (IV SOLUTION) ×3 IMPLANT
NS IRRIG 500ML POUR BTL (IV SOLUTION) ×3 IMPLANT
PACK TOTAL KNEE (MISCELLANEOUS) ×3 IMPLANT
PAD ABD DERMACEA PRESS 5X9 (GAUZE/BANDAGES/DRESSINGS) ×1 IMPLANT
PAD WRAPON POLAR KNEE (MISCELLANEOUS) ×1 IMPLANT
PULSAVAC PLUS IRRIG FAN TIP (DISPOSABLE) ×3
SCALPEL PROTECTED #10 DISP (BLADE) ×6 IMPLANT
SOL .9 NS 3000ML IRR  AL (IV SOLUTION) ×2
SOL .9 NS 3000ML IRR AL (IV SOLUTION) ×1
SOL .9 NS 3000ML IRR UROMATIC (IV SOLUTION) ×1 IMPLANT
STAPLER SKIN PROX 35W (STAPLE) ×3 IMPLANT
SUCTION FRAZIER HANDLE 10FR (MISCELLANEOUS) ×2
SUCTION TUBE FRAZIER 10FR DISP (MISCELLANEOUS) ×1 IMPLANT
SUT DVC 2 QUILL PDO  T11 36X36 (SUTURE) ×2
SUT DVC 2 QUILL PDO T11 36X36 (SUTURE) ×1 IMPLANT
SUT ETHIBOND #5 BRAIDED 30INL (SUTURE) ×2 IMPLANT
SUT ETHIBOND 2 V 37 (SUTURE) ×3 IMPLANT
SUT V-LOC 90 ABS DVC 3-0 CL (SUTURE) ×3 IMPLANT
SUT VIC AB 0 CT1 27 (SUTURE) ×3
SUT VIC AB 0 CT1 27XCR 8 STRN (SUTURE) ×1 IMPLANT
SUT VIC AB 0 CT1 36 (SUTURE) ×3 IMPLANT
SUT VIC AB 2-0 CT1 27 (SUTURE) ×3
SUT VIC AB 2-0 CT1 TAPERPNT 27 (SUTURE) ×1 IMPLANT
SYR 10ML LL (SYRINGE) ×3 IMPLANT
SYR 20CC LL (SYRINGE) ×3 IMPLANT
SYR 50ML LL SCALE MARK (SYRINGE) ×3 IMPLANT
TIP FAN IRRIG PULSAVAC PLUS (DISPOSABLE) ×1 IMPLANT
WND VAC CANISTER 500ML (MISCELLANEOUS) ×2 IMPLANT
WRAPON POLAR PAD KNEE (MISCELLANEOUS)

## 2017-10-28 NOTE — Anesthesia Post-op Follow-up Note (Signed)
Anesthesia QCDR form completed.        

## 2017-10-28 NOTE — Progress Notes (Signed)
PT Cancellation Note  Patient Details Name: Jacob Marks MRN: [id number] DOB: 1961-04-04   Cancelled Treatment:    Reason Eval/Treat Not Completed: Medical issues which prohibited therapy(Consult received and chart reviewed.  Patient status post surgical procedure this date.  Will plan for initiation of PT evaluation next date as medically appropriate.)   Sonni Barse H. Owens Shark, PT, DPT, NCS 10/28/17, 3:39 PM [phone]

## 2017-10-28 NOTE — Anesthesia Postprocedure Evaluation (Signed)
Anesthesia Post Note  Patient: Jacob Marks  Procedure(s) Performed: KNEE OPEN LATERAL RELEASE WITH MEDIAL CAPSULE REEFING (Right )  Patient location during evaluation: PACU Anesthesia Type: Spinal Level of consciousness: awake and alert Pain management: pain level controlled Vital Signs Assessment: post-procedure vital signs reviewed and stable Respiratory status: spontaneous breathing and respiratory function stable Cardiovascular status: stable Anesthetic complications: no     Last Vitals:  Vitals:   10/28/17 0951 10/28/17 1001  BP: 131/78 115/89  Pulse: (!) 58 64  Resp: 15 16  Temp:    SpO2: 100% 100%    Last Pain:  Vitals:   10/28/17 1001  TempSrc:   PainSc: 4                  [name]

## 2017-10-28 NOTE — Anesthesia Procedure Notes (Signed)
Spinal  Patient location during procedure: OR Preanesthetic Checklist Completed: patient identified, site marked, surgical consent, pre-op evaluation, timeout performed, IV checked, risks and benefits discussed and monitors and equipment checked Spinal Block Patient position: sitting Prep: ChloraPrep Patient monitoring: heart rate, continuous pulse ox, blood pressure and cardiac monitor Approach: midline Location: L4-5 Injection technique: single-shot Needle Needle type: Whitacre and Introducer  Needle gauge: 24 G Needle length: 9 cm Additional Notes Negative paresthesia. Negative blood return. Positive free-flowing CSF. Expiration date of kit checked and confirmed. Patient tolerated procedure well, without complications.

## 2017-10-28 NOTE — Op Note (Signed)
10/28/2017  8:57 AM  PATIENT:  Jacob Marks  57 y.o. male  PRE-OPERATIVE DIAGNOSIS:  status post right knee replacement, lateral subluxation of right patella  POST-OPERATIVE DIAGNOSIS:  status post right knee replacement, lateral subluxation of right patella  PROCEDURE:  Procedure(s): KNEE OPEN LATERAL RELEASE WITH MEDIAL CAPSULE REEFING (Right)  SURGEON: Laurene Footman, MD  ASSISTANTS: Rachelle Hora, PA-C  ANESTHESIA:   spinal  EBL:  Total I/O In: 800 [I.V.:800] Out: 110 [Urine:100; Blood:10]  BLOOD ADMINISTERED:none  DRAINS: none   LOCAL MEDICATIONS USED:  NONE  SPECIMEN:  No Specimen  DISPOSITION OF SPECIMEN:  N/A  COUNTS:  YES  TOURNIQUET:  * Missing tourniquet times found for documented tourniquets in log: [id number] *  IMPLANTS: Avoid suture anchor x1  DICTATION: .Dragon Dictation patient brought the operating room and after adequate anesthesia was obtained after patient identification timeout procedure was completed prior skin incision was opened with the subcutaneous tissue elevated medially laterally to expose the entire patella.  Patella was essentially just subluxed laterally with full extension was difficult to appreciate medial parapatellar arthrotomy was then performed carried out.  Next sutures were placed medial aspect capsule and brought over to the patella to tighten up the medial retinaculum.  This continues some stability to the patella subluxed.  A suture anchor was then placed in the inferior lateral aspect of the patella and the sutures were brought distally.  There is also some subtle #5 Ethibond was then used with the suture placed in the medial capsule locking suture placed around the patella this is done with 300 sutures at this helped patella in the midline with no longer subluxable bypass actually at this point the patella appeared stable throughout the range of motion has no tendency to sublux.  The knee was irrigated with pulsatile lavage heavy Quill  followed by cuticular closure and skin staples.  Incisional wound VAC applied  PLAN OF CARE: Admit for overnight observation  PATIENT DISPOSITION:  PACU - hemodynamically stable.

## 2017-10-28 NOTE — Progress Notes (Signed)
Pt admitted to room 138 from PACU around 1030 this morning. Pt is A&Ox4. Wound vac and foley in place. Initially, pt's pain was uncontrolled at 8-9/10 and received IV morphine and vicodin with some relief. This afternoon pain has improved to 4-5/10. Pt reported significant discomfort  from foley and requested that it be taken out. Foley was removed at 6pm per pt request. Pt ambulated to BR with assistance.   [place], Jerry Caras

## 2017-10-28 NOTE — Transfer of Care (Signed)
Immediate Anesthesia Transfer of Care Note  Patient: Jacob Marks  Procedure(s) Performed: KNEE OPEN LATERAL RELEASE WITH MEDIAL CAPSULE REEFING (Right )  Patient Location: PACU  Anesthesia Type:Spinal  Level of Consciousness: awake, alert  and oriented  Airway & Oxygen Therapy: Patient Spontanous Breathing and Patient connected to face mask oxygen  Post-op Assessment: Report given to RN and Post -op Vital signs reviewed and stable  Post vital signs: Reviewed and stable  Last Vitals:  Vitals Value Taken Time  BP    Temp    Pulse 82 10/28/2017  9:00 AM  Resp 17 10/28/2017  9:00 AM  SpO2 100 % 10/28/2017  9:00 AM  Vitals shown include unvalidated device data.  Last Pain:  Vitals:   10/28/17 0615  TempSrc: Temporal  PainSc: 0-No pain         Complications: No apparent anesthesia complications

## 2017-10-28 NOTE — H&P (Signed)
Reviewed paper H+P, will be scanned into chart. No changes noted.  

## 2017-10-28 NOTE — Anesthesia Preprocedure Evaluation (Signed)
Anesthesia Evaluation  Patient identified by MRN, date of birth, ID band Patient awake    Reviewed: Allergy & Precautions, NPO status , Patient's Chart, lab work & pertinent test results, reviewed documented beta blocker date and time   History of Anesthesia Complications (+) PONV and history of anesthetic complications  Airway Mallampati: III       Dental   Pulmonary asthma (as a child) , sleep apnea (not using CPAP) , former smoker,           Cardiovascular hypertension, Pt. on medications and Pt. 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, R sided weakness resorlved), Residual Symptoms    GI/Hepatic Neg liver ROS, GERD  Medicated and Controlled,  Endo/Other  neg diabetes  Renal/GU Renal InsufficiencyRenal disease (stones)     Musculoskeletal   Abdominal   Peds  Hematology  (+) anemia ,   Anesthesia Other Findings   Reproductive/Obstetrics                            Anesthesia Physical Anesthesia Plan  ASA: III  Anesthesia Plan: Spinal   Post-op Pain Management:    Induction: Intravenous  PONV Risk Score and Plan: 2 and TIVA and Propofol infusion  Airway Management Planned: Nasal Cannula  Additional Equipment:   Intra-op Plan:   Post-operative Plan:   Informed Consent: I have reviewed the patients History and Physical, chart, labs and discussed the procedure including the risks, benefits and alternatives for the proposed anesthesia with the patient or authorized representative who has indicated his/her understanding and acceptance.     Plan Discussed with:   Anesthesia Plan Comments:         Anesthesia Quick Evaluation

## 2017-10-29 DIAGNOSIS — S83011A Lateral subluxation of right patella, initial encounter: Secondary | ICD-10-CM | POA: Diagnosis not present

## 2017-10-29 MED ORDER — HYDROCODONE-ACETAMINOPHEN 7.5-325 MG PO TABS: 1.0000 | ORAL_TABLET | ORAL | 0 refills | Status: DC | PRN

## 2017-10-29 MED ORDER — ENOXAPARIN SODIUM 40 MG/0.4ML ~~LOC~~ SOLN: 40.0000 mg | SUBCUTANEOUS | 0 refills | Status: DC

## 2017-10-29 NOTE — Progress Notes (Signed)
Patient is being discharged to home. DC & RX instructions given and patient acknowledged understanding. No questions. IV removed. Wound vac changed to Prevena.

## 2017-10-29 NOTE — Discharge Instructions (Signed)
KNEE  POSTOPERATIVE DIRECTIONS  Knee Rehabilitation, Guidelines Following Surgery  Results after knee surgery are often greatly improved when you follow the exercise, range of motion and muscle strengthening exercises prescribed by your doctor. Safety measures are also important to protect the knee from further injury. Any time any of these exercises cause you to have increased pain or swelling in your knee joint, decrease the amount until you are comfortable again and slowly increase them. If you have problems or questions, call your caregiver or physical therapist for advice.   HOME CARE INSTRUCTIONS  Remove items at home which could result in a fall. This includes throw rugs or furniture in walking pathways.   Continue to use polar care unit on the knee for pain and swelling from surgery. You may notice swelling that will progress down to the foot and ankle.  This is normal after surgery.  Elevate the leg when you are not up walking on it.    Continue to use the breathing machine which will help keep your temperature down.  It is common for your temperature to cycle up and down following surgery, especially at night when you are not up moving around and exerting yourself.  The breathing machine keeps your lungs expanded and your temperature down.  Do not place pillow under knee, focus on keeping the knee STRAIGHT while resting  DIET You may resume your previous home diet once your are discharged from the hospital.  DRESSING / WOUND CARE / SHOWERING Please remove provena negative pressure dressing on 11/05/2017 and apply honey comb dressing. Keep dressing clean and dry at all times.   ACTIVITY Walk with your walker as instructed. Use walker as long as suggested by your caregivers. Avoid periods of inactivity such as sitting longer than an hour when not asleep. This helps prevent blood clots.  You may resume a sexual relationship in one month or when given the OK by your doctor.  You may  return to work once you are cleared by your doctor.  Do not drive a car for 6 weeks or until released by you surgeon.  Do not drive while taking narcotics.  WEIGHT BEARING Weight bearing as tolerated with assist device (walker, cane, etc) as directed, use it as long as suggested by your surgeon or therapist, typically at least 4-6 weeks.  POSTOPERATIVE CONSTIPATION PROTOCOL Constipation - defined medically as fewer than three stools per week and severe constipation as less than one stool per week.  One of the most common issues patients have following surgery is constipation.  Even if you have a regular bowel pattern at home, your normal regimen is likely to be disrupted due to multiple reasons following surgery.  Combination of anesthesia, postoperative narcotics, change in appetite and fluid intake all can affect your bowels.  In order to avoid complications following surgery, here are some recommendations in order to help you during your recovery period.  Colace (docusate) - Pick up an over-the-counter form of Colace or another stool softener and take twice a day as long as you are requiring postoperative pain medications.  Take with a full glass of water daily.  If you experience loose stools or diarrhea, hold the colace until you stool forms back up.  If your symptoms do not get better within 1 week or if they get worse, check with your doctor.  Dulcolax (bisacodyl) - Pick up over-the-counter and take as directed by the product packaging as needed to assist with the movement of your  bowels.  Take with a full glass of water.  Use this product as needed if not relieved by Colace only.   MiraLax (polyethylene glycol) - Pick up over-the-counter to have on hand.  MiraLax is a solution that will increase the amount of water in your bowels to assist with bowel movements.  Take as directed and can mix with a glass of water, juice, soda, coffee, or tea.  Take if you go more than two days without a  movement. Do not use MiraLax more than once per day. Call your doctor if you are still constipated or irregular after using this medication for 7 days in a row.  If you continue to have problems with postoperative constipation, please contact the office for further assistance and recommendations.  If you experience "the worst abdominal pain ever" or develop nausea or vomiting, please contact the office immediatly for further recommendations for treatment.  ITCHING  If you experience itching with your medications, try taking only a single pain pill, or even half a pain pill at a time.  You can also use Benadryl over the counter for itching or also to help with sleep.   TED HOSE STOCKINGS Wear the elastic stockings on both legs for six weeks following surgery during the day but you may remove then at night for sleeping.  MEDICATIONS See your medication summary on the After Visit Summary that the nursing staff will review with you prior to discharge.  You may have some home medications which will be placed on hold until you complete the course of blood thinner medication.  It is important for you to complete the blood thinner medication as prescribed by your surgeon.  Continue your approved medications as instructed at time of discharge.  PRECAUTIONS If you experience chest pain or shortness of breath - call 911 immediately for transfer to the hospital emergency department.  If you develop a fever greater that 101 F, purulent drainage from wound, increased redness or drainage from wound, foul odor from the wound/dressing, or calf pain - CONTACT YOUR SURGEON.                                                   FOLLOW-UP APPOINTMENTS Make sure you keep all of your appointments after your operation with your surgeon and caregivers. You should call the office at the above phone number and make an appointment for approximately two weeks after the date of your surgery or on the date instructed by your  surgeon outlined in the "After Visit Summary".   RANGE OF MOTION AND STRENGTHENING EXERCISES  Rehabilitation of the knee is important following a knee injury or an operation. After just a few days of immobilization, the muscles of the thigh which control the knee become weakened and shrink (atrophy). Knee exercises are designed to build up the tone and strength of the thigh muscles and to improve knee motion. Often times heat used for twenty to thirty minutes before working out will loosen up your tissues and help with improving the range of motion but do not use heat for the first two weeks following surgery. These exercises can be done on a training (exercise) mat, on the floor, on a table or on a bed. Use what ever works the best and is most comfortable for you Knee exercises include:  Leg Lifts -  While your knee is still immobilized in a splint or cast, you can do straight leg raises. Lift the leg to 60 degrees, hold for 3 sec, and slowly lower the leg. Repeat 10-20 times 2-3 times daily. Perform this exercise against resistance later as your knee gets better.  Quad and Hamstring Sets - Tighten up the muscle on the front of the thigh (Quad) and hold for 5-10 sec. Repeat this 10-20 times hourly. Hamstring sets are done by pushing the foot backward against an object and holding for 5-10 sec. Repeat as with quad sets.   Leg Slides: Lying on your back, slowly slide your foot toward your buttocks, bending your knee up off the floor (only go as far as is comfortable). Then slowly slide your foot back down until your leg is flat on the floor again.  Angel Wings: Lying on your back spread your legs to the side as far apart as you can without causing discomfort.  A rehabilitation program following serious knee injuries can speed recovery and prevent re-injury in the future due to weakened muscles. Contact your doctor or a physical therapist for more information on knee rehabilitation.   IF YOU ARE  TRANSFERRED TO A SKILLED REHAB FACILITY If the patient is transferred to a skilled rehab facility following release from the hospital, a list of the current medications will be sent to the facility for the patient to continue.  When discharged from the skilled rehab facility, please have the facility set up the patient's [place] prior to being released. Also, the skilled facility will be responsible for providing the patient with their medications at time of release from the facility to include their pain medication, the muscle relaxants, and their blood thinner medication. If the patient is still at the rehab facility at time of the two week follow up appointment, the skilled rehab facility will also need to assist the patient in arranging follow up appointment in our office and any transportation needs.  MAKE SURE YOU:  Understand these instructions.  Get help right away if you are not doing well or get worse.

## 2017-10-29 NOTE — Plan of Care (Signed)
  Problem: Activity: Goal: Risk for activity intolerance will decrease Outcome: Progressing   Problem: Nutrition: Goal: Adequate nutrition will be maintained Outcome: Progressing   Problem: Coping: Goal: Level of anxiety will decrease Outcome: Progressing   Problem: Pain Managment: Goal: General experience of comfort will improve Outcome: Progressing   Problem: Skin Integrity: Goal: Demonstration of wound healing without infection will improve Outcome: Progressing

## 2017-10-29 NOTE — Progress Notes (Signed)
   Subjective: 1 Day Post-Op Procedure(s) (LRB): KNEE OPEN LATERAL RELEASE WITH MEDIAL CAPSULE REEFING (Right) Patient reports pain as mild.   Patient is well, and has had no acute complaints or problems Denies any nausea, vomiting CP, SOB, ABD pain. We will start physical therapy today.  Plan is to go Home after hospital stay.  Objective: Vital signs in last 24 hours: Temp:  [97.1 F (36.2 C)-99.1 F (37.3 C)] 97.6 F (36.4 C) (07/12 0410) Pulse Rate:  [57-90] 67 (07/12 0410) Resp:  [12-18] 18 (07/12 0410) BP: (107-147)/(41-89) 125/82 (07/12 0410) SpO2:  [97 %-100 %] 98 % (07/12 0410)  Intake/Output from previous day: 07/11 0701 - 07/12 0700 In: 3853.3 [P.O.:600; I.V.:3053.3; IV Piggyback:200] Out: 6644 [Urine:1775; Blood:10] Intake/Output this shift: No intake/output data recorded.  Recent Labs    10/28/17 1129  HGB 14.4   Recent Labs    10/28/17 1129  WBC 7.8  RBC 4.80  HCT 42.3  PLT 206   Recent Labs    10/28/17 1129  CREATININE 0.97   No results for input(s): LABPT, INR in the last 72 hours.  EXAM General - Patient is Alert, Appropriate and Oriented Extremity - Neurovascular intact Sensation intact distally Intact pulses distally Dorsiflexion/Plantar flexion intact No cellulitis present Compartment soft  Patella is tracking well. Dressing - dressing C/D/I and no drainage, wound VAC is intact without drainage.   Motor Function - intact, moving foot and toes well on exam.   Past Medical History:  Diagnosis Date  . Arthritis    osteoarthritis of right knee  . Cancer ([place])    kidney  . Childhood asthma   . Chronic kidney disease    renal cyst right  . Coronary artery disease   . CVA (cerebral infarction) 10/2010   after his hemicolectomy; "some paralysis right side of mouth and slight speech problem as a result" (05/07/2014)  . Diverticulosis 7/12   with hemicolectomy-- complications   . ED (erectile dysfunction)   . Heart murmur    as a  child  . History of kidney cancer    lesion on the right kidney  . History of kidney stones   . Hyperlipidemia   . Hypertension   . Kidney stones   . OSA (obstructive sleep apnea)    "has CPAP; doesn't wear it" (05/07/2014)  . Pes planus   . STEMI (ST elevation myocardial infarction) ([place]) 07/2012  . Stroke ([place])    slight right sided weakness - in face   . Testosterone deficiency     Assessment/Plan:   1 Day Post-Op Procedure(s) (LRB): KNEE OPEN LATERAL RELEASE WITH MEDIAL CAPSULE REEFING (Right) Active Problems:   Patellar subluxation, right, sequela  Estimated body mass index is 39.22 kg/m as calculated from the following:   Height as of this encounter: 5\' 9"  (1.753 m).   Weight as of this encounter: 120.5 kg (265 lb 9.6 oz). Advance diet Up with therapy, weightbearing as tolerated right lower extremity.  Range of motion as tolerated. Plan on discharge to home with home health physical therapy today or tomorrow pending progress with PT.   DVT Prophylaxis - Lovenox, Foot Pumps and TED hose Weight-Bearing as tolerated to right leg   T. Rachelle Hora, PA-C [place] 10/29/2017, 7:39 AM

## 2017-10-29 NOTE — Care Management (Signed)
Physical therapy evaluation completed, Recommending home with home health and physical therapy. Spoke with Mr. Bala at the bedside. States he has an appointment for outpatient physical therapy next Tuesday. States he has a rolling walker in the home. Discharge to home today per [facility] [place] RN MSN [place] [facility] [phone]

## 2017-10-29 NOTE — Care Management (Signed)
CM confirmed with [facility] Physical therapy 7/16- Tuesday at 2pm .

## 2017-10-29 NOTE — Discharge Summary (Addendum)
Physician Discharge Summary  Patient ID: Jacob Marks MRN: [id number] DOB/AGE: [birth year] 57 y.o.  Admit date: 10/28/2017 Discharge date: 10/29/2017  Admission Diagnoses:  status post right knee replacement, lateral subluxation of right patella   Discharge Diagnoses: Patient Active Problem List   Diagnosis Date Noted  . Patellar subluxation, right, sequela 10/28/2017  . Status post total knee replacement using cement, right 08/17/2017  . Insomnia 06/12/2016  . Renal mass, right 05/08/2016  . Morbid obesity ([place]) 04/30/2015  . CAD (coronary artery disease) 05/07/2014  . Incarcerated incisional hernia 04/23/2014  . Erectile dysfunction 02/21/2014  . Allergic rhinitis 09/04/2013  . Vertigo, benign paroxysmal 09/04/2013  . STEMI (ST elevation myocardial infarction) ([place]) 08/12/2012  . Chest pain 03/21/2012  . GERD (gastroesophageal reflux disease) 03/21/2012  . Dyspnea 06/09/2011  . Post-nasal drip 06/09/2011  . Kidney stone 06/09/2011  . Anxiety 06/09/2011  . Dysphagia 03/19/2011  . Diverticulosis 01/04/2011  . Wound infection after surgery 01/04/2011  . CVA (cerebral infarction) 01/02/2011  . Depression 01/02/2011  . Anemia 01/02/2011  . Fatigue 08/05/2010  . Prostate cancer screening 08/05/2010  . TINEA VERSICOLOR 01/15/2010  . EYE FLOATERS 01/15/2010  . ONYCHIA AND PARONYCHIA OF FINGER 11/02/2008  . 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 Testosterone deficiency 11/16/2007  . HYPERTENSION, CONTROLLED 11/16/2007  . Sleep apnea 11/16/2007  . 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 Hyperlipidemia 05/28/2003    Past Medical History:  Diagnosis Date  . Arthritis    osteoarthritis of right knee  . Cancer ([place])    kidney  . Childhood asthma   . Chronic kidney disease    renal cyst right  . Coronary artery disease   . CVA (cerebral infarction) 10/2010   after his hemicolectomy; "some paralysis right side of mouth and slight speech problem as a result"  (05/07/2014)  . Diverticulosis 7/12   with hemicolectomy-- complications   . ED (erectile dysfunction)   . Heart murmur    as a child  . History of kidney cancer    lesion on the right kidney  . History of kidney stones   . Hyperlipidemia   . Hypertension   . Kidney stones   . OSA (obstructive sleep apnea)    "has CPAP; doesn't wear it" (05/07/2014)  . Pes planus   . STEMI (ST elevation myocardial infarction) ([place]) 07/2012  . Stroke ([place])    slight right sided weakness - in face   . Testosterone deficiency      Transfusion: none   Consultants (if any):   Discharged Condition: Improved  Hospital Course: Jacob Marks is an 57 y.o. male who was admitted 10/28/2017 with a diagnosis of patella subluxation and went to the operating room on 10/28/2017 and underwent the above named procedures.    Surgeries: Procedure(s): KNEE OPEN LATERAL RELEASE WITH MEDIAL CAPSULE REEFING on 10/28/2017 Patient tolerated the surgery well. Taken to PACU where she was stabilized and then transferred to the orthopedic floor.  Started on Lovenox 40mg   q 24 hrs. Foot pumps applied bilaterally at 80 mm. Heels elevated on bed with rolled towels. No evidence of DVT. Negative [place]. Physical therapy started on day #1 for gait training and transfer. OT started day #1 for ADL and assisted devices.  Patient's foley was d/c on day #1. Patient's IV was d/c on day #1.  On post op day #1 patient was stable and ready for discharge to home with HHPT.  Implants: none  He was given perioperative  antibiotics:  Anti-infectives (From admission, onward)   Start     Dose/Rate Route Frequency Ordered Stop   10/28/17 1330  ceFAZolin (ANCEF) IVPB 2g/100 mL premix     2 g 200 mL/hr over 30 Minutes Intravenous Every 6 hours 10/28/17 0910 10/29/17 0105   10/28/17 0613  ceFAZolin (ANCEF) 2-4 GM/100ML-% IVPB    Note to Pharmacy:  Myles Lipps   : cabinet override      10/28/17 0613 10/28/17 1829   10/28/17 0115  ceFAZolin  (ANCEF) IVPB 2g/100 mL premix  Status:  Discontinued     2 g 200 mL/hr over 30 Minutes Intravenous  Once 10/28/17 0112 10/28/17 1032    .  He was given sequential compression devices, early ambulation, and Aspirin for DVT prophylaxis.  He benefited maximally from the hospital stay and there were no complications.    Recent vital signs:  Vitals:   10/29/17 0842 10/29/17 1020  BP: (!) 144/80 (!) 178/91  Pulse: 73   Resp: 18   Temp:    SpO2: 99% 99%    Recent laboratory studies:  Lab Results  Component Value Date   HGB 14.4 10/28/2017   HGB 13.6 08/19/2017   HGB 13.9 08/18/2017   Lab Results  Component Value Date   WBC 7.8 10/28/2017   PLT 206 10/28/2017   Lab Results  Component Value Date   INR 0.96 08/04/2017   Lab Results  Component Value Date   NA 135 08/19/2017   K 3.9 08/19/2017   CL 102 08/19/2017   CO2 25 08/19/2017   BUN 17 08/19/2017   CREATININE 0.97 10/28/2017   GLUCOSE 163 (H) 08/19/2017    Discharge Medications:   Allergies as of 10/29/2017   No Known Allergies     Medication List    TAKE these medications   aspirin EC 81 MG tablet Take 162 mg by mouth daily.   atorvastatin 40 MG tablet Commonly known as:  LIPITOR TAKE ONE TABLET BY MOUTH ONCE DAILY What changed:    how much to take  how to take this  when to take this   azelastine 0.1 % nasal spray Commonly known as:  ASTELIN Place 1 spray into both nostrils daily. Use in each nostril as directed   docusate sodium 100 MG capsule Commonly known as:  COLACE Take 100 mg by mouth as needed for mild constipation.   fluticasone 50 MCG/ACT nasal spray Commonly known as:  FLONASE Place 2 sprays into both nostrils daily.   HYDROcodone-acetaminophen 7.5-325 MG tablet Commonly known as:  NORCO Take 1-2 tablets by mouth every 4 (four) hours as needed for moderate pain.   ibuprofen 200 MG tablet Commonly known as:  ADVIL,MOTRIN Take 800 mg by mouth 2 (two) times daily as needed for  headache or moderate pain.   lisinopril 10 MG tablet Commonly known as:  PRINIVIL,ZESTRIL Take 1 tablet (10 mg total) by mouth daily.   loratadine 10 MG tablet Commonly known as:  CLARITIN Take 10 mg by mouth daily.   methocarbamol 500 MG tablet Commonly known as:  ROBAXIN Take 1 tablet (500 mg total) by mouth every 6 (six) hours as needed for muscle spasms.   metoprolol tartrate 25 MG tablet Commonly known as:  LOPRESSOR TAKE ONE TABLET BY MOUTH TWICE DAILY   nitroGLYCERIN 0.4 MG SL tablet Commonly known as:  NITROSTAT Place 1 tablet (0.4 mg total) under the tongue every 5 (five) minutes as needed for chest pain.   pyridOXINE 100  MG tablet Commonly known as:  VITAMIN B-6 Take 100 mg by mouth daily.   scopolamine 1 MG/3DAYS Commonly known as:  TRANSDERM-SCOP Place 1 patch (1.5 mg total) onto the skin every 3 (three) days.   simethicone 80 MG chewable tablet Commonly known as:  MYLICON Chew 1 tablet (80 mg total) by mouth 4 (four) times daily.   vitamin B-12 1000 MCG tablet Commonly known as:  CYANOCOBALAMIN Take 1,000 mcg by mouth daily.   zolpidem 5 MG tablet Commonly known as:  AMBIEN Take 5 mg by mouth at bedtime as needed for sleep.       Diagnostic Studies: No results found.  Disposition: Discharge disposition: 01-Home or Self Care         Follow-up Information    [name], MD In 2 weeks.   Specialty:  Orthopedic Surgery Why:  office will call back w/ Appt Contact information: [place] [address] [phone]            Signed: Feliberto Gottron 10/29/2017, 1:33 PM

## 2017-10-29 NOTE — Evaluation (Signed)
Physical Therapy Evaluation Patient Details Name: Jacob Marks MRN: [id number] DOB: 1961-02-13 Today's Date: 10/29/2017   History of Present Illness  Patient is 57 yo male s/p R knee open lateral release with medial capsule reefing 10/28/17 with PMH R TKA 08/17/17  of HTN, CAD, past MI and stent placements, GERD, anxiety, depression, HOH, CVA 6 years ago (facial/speech weakness)  Clinical Impression  Patient A&Ox4 at start of session, pain in R knee 2/10. The patient reports independent prior to admission, and states he stopped using his RW about 2 weeks ago. Able to complete therapeutic exercises with minimal prompting. Patient able to mobilize to EOB mod I, transfers with supervision, and ambulated ~281ft with RW, CGA and step through gait pattern. Patient navigated 4 steps x2 with 1 rail with CGA/supervision with minimal verbal cues from PT. R knee extension: 0 deg, R knee flexion: 60deg.The patient would benefit from further skilled PT to address changes in ambulation, gait, balance, strength, endurance and activity tolerance from PLOF.    Follow Up Recommendations Home health PT    Equipment Recommendations  Rolling walker with 5" wheels    Recommendations for Other Services       Precautions / Restrictions Precautions Precautions: Knee Restrictions Weight Bearing Restrictions: Yes RLE Weight Bearing: Weight bearing as tolerated      Mobility  Bed Mobility Overal bed mobility: Modified Independent                Transfers Overall transfer level: Needs assistance Equipment used: Rolling walker (2 wheeled) Transfers: Sit to/from Stand Sit to Stand: Supervision            Ambulation/Gait Ambulation/Gait assistance: Min guard;Supervision Gait Distance (Feet): 250 Feet Assistive device: Rolling walker (2 wheeled) Gait Pattern/deviations: Step-through pattern;WFL(Within Functional Limits)        Stairs Stairs: Yes Stairs assistance: Min  guard;Supervision Stair Management: One rail Right Number of Stairs: 4 General stair comments: Navigated stairs x2  Wheelchair Mobility    Modified Rankin (Stroke Patients Only)       Balance Overall balance assessment: Mild deficits observed, not formally tested                                           Pertinent Vitals/Pain Pain Assessment: 0-10 Pain Score: 2  Pain Location: R knee  Pain Descriptors / Indicators: Sharp Pain Intervention(s): Repositioned;Premedicated before session;Ice applied    Home Living Family/patient expects to be discharged to:: Private residence Living Arrangements: Spouse/significant other Available Help at Discharge: Family Type of Home: House Home Access: Stairs to enter Entrance Stairs-Rails: Right Entrance Stairs-Number of Steps: 5, last step into door does not have [place]: One [place]: [place] - 2 wheels;Cane - single point;Shower seat      Prior Function Level of Independence: Independent         Comments: Patient reports he had stopped using the walker about 2 weeks before admission     Hand Dominance   Dominant Hand: Right    Extremity/Trunk Assessment   Upper Extremity Assessment Upper Extremity Assessment: Overall WFL for tasks assessed;RUE deficits/detail;LUE deficits/detail RUE Deficits / Details: 3+/5 LUE Deficits / Details: 3+/5    Lower Extremity Assessment Lower Extremity Assessment: RLE deficits/detail;LLE deficits/detail RLE Deficits / Details: 3/5 LLE Deficits / Details: 4/5       Communication   Communication: HOH  Cognition Arousal/Alertness:  Awake/alert Behavior During Therapy: [facility] for tasks assessed/performed                                          General Comments      Exercises General Exercises - Lower Extremity Ankle Circles/Pumps: AROM;Both;20 reps Quad Sets: AROM;Both;20 reps Heel Slides: AROM;Right;20 reps Hip  ABduction/ADduction: AROM;Right;20 reps Straight Leg Raises: AROM;Right;20 reps   Assessment/Plan    PT Assessment Patient needs continued PT services  PT Problem List Decreased range of motion;Decreased strength;Pain;Decreased activity tolerance;Decreased balance;Decreased mobility       PT Treatment Interventions DME instruction;Therapeutic exercise;Gait training;Balance training;Stair training;Neuromuscular re-education;Functional mobility training;Therapeutic activities;Patient/family education    PT Goals (Current goals can be found in the Care Plan section)  Acute Rehab PT Goals Patient Stated Goal: Patient wants to return home, return to PLOF PT Goal Formulation: With patient Time For Goal Achievement: 11/12/17 Potential to Achieve Goals: Good    Frequency BID   Barriers to discharge        Co-evaluation               AM-PAC PT "6 Clicks" Daily Activity  Outcome Measure Difficulty turning over in bed (including adjusting bedclothes, sheets and blankets)?: None Difficulty moving from lying on back to sitting on the side of the bed? : A Little Difficulty sitting down on and standing up from a chair with arms (e.g., wheelchair, bedside commode, etc,.)?: A Little Help needed moving to and from a bed to chair (including a wheelchair)?: A Little Help needed walking in hospital room?: A Little Help needed climbing 3-5 steps with a railing? : A Little 6 Click Score: 19    End of Session Equipment Utilized During Treatment: Gait belt Activity Tolerance: Patient tolerated treatment well Patient left: in chair;with call bell/phone within reach;with chair alarm set;with SCD's reapplied;Other (comment)(heels elevated) Nurse Communication: Mobility status PT Visit Diagnosis: Other abnormalities of gait and mobility (R26.89);Difficulty in walking, not elsewhere classified (R26.2);Pain Pain - Right/Left: Right Pain - part of body: Knee    Time: 7588-3254 PT Time  Calculation (min) (ACUTE ONLY): 45 min   Charges:   PT Evaluation $PT Eval Low Complexity: 1 Low PT Treatments $Therapeutic Exercise: 8-22 mins $Therapeutic Activity: 8-22 mins   PT G Codes:        Lieutenant Diego PT, DPT 10:32 AM,10/29/17 [phone]

## 2017-10-31 ENCOUNTER — Encounter: Payer: Self-pay | Admitting: Orthopedic Surgery

## 2017-12-05 ENCOUNTER — Emergency Department
Admission: EM | Admit: 2017-12-05 | Discharge: 2017-12-05 | Disposition: A | Discharge status: Home / Self Care | Payer: BC Managed Care – PPO | Attending: Student in an Organized Health Care Education/Training Program | Admitting: Student in an Organized Health Care Education/Training Program

## 2017-12-05 ENCOUNTER — Other Ambulatory Visit: Payer: Self-pay

## 2017-12-05 ENCOUNTER — Emergency Department: Payer: BC Managed Care – PPO

## 2017-12-05 DIAGNOSIS — I252 Old myocardial infarction: Secondary | ICD-10-CM | POA: Diagnosis not present

## 2017-12-05 DIAGNOSIS — I2510 Atherosclerotic heart disease of native coronary artery without angina pectoris: Secondary | ICD-10-CM | POA: Diagnosis not present

## 2017-12-05 DIAGNOSIS — N189 Chronic kidney disease, unspecified: Secondary | ICD-10-CM | POA: Diagnosis not present

## 2017-12-05 DIAGNOSIS — Z79899 Other long term (current) drug therapy: Secondary | ICD-10-CM | POA: Insufficient documentation

## 2017-12-05 DIAGNOSIS — Z8673 Personal history of transient ischemic attack (TIA), and cerebral infarction without residual deficits: Secondary | ICD-10-CM | POA: Insufficient documentation

## 2017-12-05 DIAGNOSIS — R61 Generalized hyperhidrosis: Secondary | ICD-10-CM | POA: Diagnosis not present

## 2017-12-05 DIAGNOSIS — Z7982 Long term (current) use of aspirin: Secondary | ICD-10-CM | POA: Insufficient documentation

## 2017-12-05 DIAGNOSIS — Z85528 Personal history of other malignant neoplasm of kidney: Secondary | ICD-10-CM | POA: Diagnosis not present

## 2017-12-05 DIAGNOSIS — I129 Hypertensive chronic kidney disease with stage 1 through stage 4 chronic kidney disease, or unspecified chronic kidney disease: Secondary | ICD-10-CM | POA: Insufficient documentation

## 2017-12-05 DIAGNOSIS — R002 Palpitations: Secondary | ICD-10-CM | POA: Insufficient documentation

## 2017-12-05 DIAGNOSIS — Z87891 Personal history of nicotine dependence: Secondary | ICD-10-CM | POA: Insufficient documentation

## 2017-12-05 LAB — BASIC METABOLIC PANEL
Anion gap: 5 (ref 5–15)
BUN: 19 mg/dL (ref 6–20)
CALCIUM: 8.9 mg/dL (ref 8.9–10.3)
CO2: 29 mmol/L (ref 22–32)
Chloride: 109 mmol/L (ref 98–111)
Creatinine, Ser: 0.92 mg/dL (ref 0.61–1.24)
GFR calc Af Amer: >60 mL/min (ref >60)
GLUCOSE: 128 mg/dL — AB (ref 70–99)
POTASSIUM: 4.4 mmol/L (ref 3.5–5.1)
Sodium: 143 mmol/L (ref 135–145)

## 2017-12-05 LAB — CBC
HEMATOCRIT: 40.4 % (ref 40.0–52.0)
Hemoglobin: 14 g/dL (ref 13.0–18.0)
MCH: 29.9 pg (ref 26.0–34.0)
MCHC: 34.7 g/dL (ref 32.0–36.0)
MCV: 86.2 fL (ref 80.0–100.0)
PLATELETS: 174 10*3/uL (ref 150–440)
RBC: 4.69 MIL/uL (ref 4.40–5.90)
RDW: 14.8 % — AB (ref 11.5–14.5)
WBC: 5.9 10*3/uL (ref 3.8–10.6)

## 2017-12-05 LAB — TSH: TSH: 1.939 u[IU]/mL (ref 0.350–4.500)

## 2017-12-05 LAB — MAGNESIUM: MAGNESIUM: 2 mg/dL (ref 1.7–2.4)

## 2017-12-05 LAB — TROPONIN I

## 2017-12-05 NOTE — ED Triage Notes (Signed)
Pt c/o feeling llike his heart is skipping since yesterday. Denies any pain or SOB with it. Pt is in NAD. Respirations WNL.

## 2017-12-05 NOTE — ED Provider Notes (Signed)
[facility] Emergency Department Provider Note    First MD Initiated Contact with Patient 12/05/17 [phone]     (approximate)  I have reviewed the triage vital signs and the nursing notes.   HISTORY  Chief Complaint Palpitations    HPI Jacob Marks is a 57 y.o. male presents the ER for evaluation of palpitations.  Does have a history of CAD status post stent but is only on aspirin as well as metoprolol.  States that for the past several weeks will have episodes where he can feel his heart skipping a beat.  Does not feel his heart racing.  Denies any chest pain or shortness of breath.  Had an episode last night early in evening where he was diaphoretic but denied any pain.  No fevers or cough.  No shortness of breath.  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    Past Medical History:  Diagnosis Date  . Arthritis    osteoarthritis of right knee  . Cancer ([place])    kidney  . Childhood asthma   . Chronic kidney disease    renal cyst right  . Coronary artery disease   . CVA (cerebral infarction) 10/2010   after his hemicolectomy; "some paralysis right side of mouth and slight speech problem as a result" (05/07/2014)  . Diverticulosis 7/12   with hemicolectomy-- complications   . ED (erectile dysfunction)   . Heart murmur    as a child  . History of kidney cancer    lesion on the right kidney  . History of kidney stones   . Hyperlipidemia   . Hypertension   . Kidney stones   . OSA (obstructive sleep apnea)    "has CPAP; doesn't wear it" (05/07/2014)  . Pes planus   . STEMI (ST elevation myocardial infarction) ([place]) 07/2012  . Stroke ([place])    slight right sided weakness - in face   . Testosterone deficiency    Family History  Problem Relation Age of Onset  . Heart disease Mother 66       MI  .  Hypertension Mother   . Alcohol abuse Mother   . Heart disease Father   . Hypertension Father   . Cancer Sister        colon   Past Surgical History:  Procedure Laterality Date  . ABDOMINAL EXPLORATION SURGERY  05/07/2014   w/LO;&  Repair multiple, incarcerated incisional hernias with mesh  . CARDIAC CATHETERIZATION    . carotid dopplers  8/12   normal - after cva   . COLON SURGERY     due to diverticulosis  . CORONARY ANGIOPLASTY WITH STENT PLACEMENT  07/2012   to the distal RCA  . EXTRACORPOREAL SHOCK WAVE LITHOTRIPSY  2014  . HEMICOLECTOMY  10/2010   for diverticulosis, complic by leaking anastamosis/ abcess/ addn surg and iliostomy   . HERNIA REPAIR  05/07/2014   8 repairs  . ILEOSTOMY  10/2010  . ILEOSTOMY CLOSURE  06/2011  . INCISIONAL HERNIA REPAIR N/A 05/07/2014   Procedure: REPAIR RECURRENT INCISIONAL HERNIA;  Surgeon: Fanny Skates, MD;  Location: [place];  Service: General;  Laterality: N/A;  . INSERTION OF MESH N/A 05/07/2014   Procedure: INSERTION OF MESH;  Surgeon: Fanny Skates, MD;  Location: [place];  Service: General;  Laterality: N/A;  . JOINT REPLACEMENT     right knee  . KNEE ARTHROTOMY Right 10/28/2017   Procedure: KNEE OPEN LATERAL RELEASE WITH MEDIAL CAPSULE REEFING;  Surgeon: [name], MD;  Location: [facility];  Service: Orthopedics;  Laterality: Right;  . ROBOTIC ASSITED PARTIAL NEPHRECTOMY Right 05/08/2016   Procedure: ROBOTIC ASSITED RETROPERITONEAL PARTIAL NEPHRECTOMY;  Surgeon: Alexis Frock, MD;  Location: [facility];  Service: Urology;  Laterality: Right;  . TESTICLE SURGERY     as a child; "they didn't descend"  . TONSILLECTOMY AND ADENOIDECTOMY     as a child  . TOTAL KNEE ARTHROPLASTY Right 08/17/2017   Procedure: TOTAL KNEE ARTHROPLASTY;  Surgeon: [name], MD;  Location: [facility];  Service: Orthopedics;  Laterality: Right;  Marland Kitchen VASECTOMY     Patient Active Problem List   Diagnosis Date Noted  . Patellar subluxation, right, sequela 10/28/2017    . Status post total knee replacement using cement, right 08/17/2017  . Insomnia 06/12/2016  . Renal mass, right 05/08/2016  . Morbid obesity ([place]) 04/30/2015  . CAD (coronary artery disease) 05/07/2014  . Incarcerated incisional hernia 04/23/2014  . Erectile dysfunction 02/21/2014  . Allergic rhinitis 09/04/2013  . Vertigo, benign paroxysmal 09/04/2013  . STEMI (ST elevation myocardial infarction) ([place]) 08/12/2012  . Chest pain 03/21/2012  . GERD (gastroesophageal reflux disease) 03/21/2012  . Dyspnea 06/09/2011  . Post-nasal drip 06/09/2011  . Kidney stone 06/09/2011  . Anxiety 06/09/2011  . Dysphagia 03/19/2011  . Diverticulosis 01/04/2011  . Wound infection after surgery 01/04/2011  . CVA (cerebral infarction) 01/02/2011  . Depression 01/02/2011  . Anemia 01/02/2011  . Fatigue 08/05/2010  . Prostate cancer screening 08/05/2010  . TINEA VERSICOLOR 01/15/2010  . EYE FLOATERS 01/15/2010  . ONYCHIA AND PARONYCHIA OF FINGER 11/02/2008  . 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 Testosterone deficiency 11/16/2007  . HYPERTENSION, CONTROLLED 11/16/2007  . Sleep apnea 11/16/2007  . 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 Hyperlipidemia 05/28/2003      Prior to Admission medications   Medication Sig Start Date End Date Taking? Authorizing Provider  aspirin EC 81 MG tablet Take 162 mg by mouth daily.     [provider]  atorvastatin (LIPITOR) 40 MG tablet TAKE ONE TABLET BY MOUTH ONCE DAILY Patient taking differently: Take 40 mg by mouth once daily at night 12/03/16   Minna Merritts, MD  azelastine (ASTELIN) 0.1 % nasal spray Place 1 spray into both nostrils daily. Use in each nostril as directed    [provider]  docusate sodium (COLACE) 100 MG capsule Take 100 mg by mouth as needed for mild constipation.    [provider]  fluticasone (FLONASE) 50 MCG/ACT nasal spray Place 2 sprays into both nostrils daily.    [provider]  HYDROcodone-acetaminophen (NORCO) 7.5-325 MG tablet Take 1-2 tablets by mouth every 4 (four) hours as needed for moderate pain. 10/29/17   Duanne Guess, PA-C  ibuprofen (ADVIL,MOTRIN) 200 MG tablet Take 800 mg by mouth 2 (two) times daily as needed for headache or moderate pain.    [provider]  lisinopril (PRINIVIL,ZESTRIL) 10 MG tablet Take 1 tablet (10 mg total) by mouth daily. 09/27/17   Minna Merritts, MD  loratadine (CLARITIN) 10 MG tablet Take 10 mg by mouth daily.    [provider]  methocarbamol (ROBAXIN) 500 MG tablet Take 1 tablet (500 mg total) by mouth every 6 (six) hours as needed for muscle spasms. 08/19/17  Dorise Hiss C, PA-C  metoprolol tartrate (LOPRESSOR) 25 MG tablet TAKE ONE TABLET BY MOUTH TWICE DAILY 09/27/17   Minna Merritts, MD  nitroGLYCERIN (NITROSTAT) 0.4 MG SL tablet Place 1 tablet (0.4 mg total) under the tongue every 5 (five) minutes as needed for chest pain. 04/30/15   Minna Merritts, MD  pyridOXINE (VITAMIN B-6) 100 MG tablet Take 100 mg by mouth daily.    [provider]  scopolamine (TRANSDERM-SCOP) 1 MG/3DAYS Place 1 patch (1.5 mg total) onto the skin every 3 (three) days. Patient not taking: Reported on 10/25/2017 08/21/17   Duanne Guess, PA-C  simethicone (MYLICON) 80 MG chewable tablet Chew 1 tablet (80 mg total) by mouth 4 (four) times daily. Patient not taking: Reported on 10/25/2017 08/19/17   Duanne Guess, PA-C  vitamin B-12 (CYANOCOBALAMIN) 1000 MCG tablet Take 1,000 mcg by mouth daily.    [provider]  zolpidem (AMBIEN) 5 MG tablet Take 5 mg by mouth at bedtime as needed for sleep. 09/15/17   [provider]    Allergies Patient has no known allergies.    Social History Social History   Tobacco Use  . Smoking status: Former Smoker    Packs/day: 1.00    Years: 2.00    Pack years: 2.00    Types: Cigarettes    Last attempt to quit: 04/20/1978    Years since quitting: 39.6  .  Smokeless tobacco: Never Used  Substance Use Topics  . Alcohol use: Yes    Alcohol/week: 0.0 standard drinks    Comment: occasional  . Drug use: No    Review of Systems Patient denies headaches, rhinorrhea, blurry vision, numbness, shortness of breath, chest pain, edema, cough, abdominal pain, nausea, vomiting, diarrhea, dysuria, fevers, rashes or hallucinations unless otherwise stated above in HPI. ____________________________________________   PHYSICAL EXAM:  VITAL SIGNS: Vitals:   12/05/17 0724  BP: (!) 149/78  Pulse: 64  Resp: 18  Temp: 98.6 F (37 C)  SpO2: 97%    Constitutional: Alert and oriented.  Eyes: Conjunctivae are normal.  Head: Atraumatic. Nose: No congestion/rhinnorhea. Mouth/Throat: Mucous membranes are moist.   Neck: No stridor. Painless ROM.  Cardiovascular: Normal rate, regular rhythm. Grossly normal heart sounds.  Good peripheral circulation. Respiratory: Normal respiratory effort.  No retractions. Lungs CTAB. Gastrointestinal: Soft and nontender. No distention. No abdominal bruits. No CVA tenderness. Genitourinary:  Musculoskeletal: No lower extremity tenderness nor edema.  No joint effusions. Neurologic:  Normal speech and language. No gross focal neurologic deficits are appreciated. No facial droop Skin:  Skin is warm, dry and intact. No rash noted. Psychiatric: Mood and affect are normal. Speech and behavior are normal.  ____________________________________________   LABS (all labs ordered are listed, but only abnormal results are displayed)  Results for orders placed or performed during the hospital encounter of 12/05/17 (from the past 24 hour(s))  CBC     Status: Abnormal   Collection Time: 12/05/17  7:33 AM  Result Value Ref Range   WBC 5.9 3.8 - 10.6 K/uL   RBC 4.69 4.40 - 5.90 MIL/uL   Hemoglobin 14.0 13.0 - 18.0 g/dL   HCT 40.4 40.0 - 52.0 %   MCV 86.2 80.0 - 100.0 fL   MCH 29.9 26.0 - 34.0 pg   MCHC 34.7 32.0 - 36.0 g/dL   RDW  14.8 (H) 11.5 - 14.5 %   Platelets 174 150 - 440 K/uL   ____________________________________________  EKG My review and personal interpretation at  Time: 7:10   Indication: palpitations  Rate: 65  Rhythm: sinus with occasional pvc Axis: normal Other: normal intervals, no stemi ____________________________________________  RADIOLOGY  I personally reviewed all radiographic images ordered to evaluate for the above acute complaints and reviewed radiology reports and findings.  These findings were personally discussed with the patient.  Please see medical record for radiology report.  ____________________________________________   PROCEDURES  Procedure(s) performed:  Procedures    Critical Care performed: no ____________________________________________   INITIAL IMPRESSION / ASSESSMENT AND PLAN / ED COURSE  Pertinent labs & imaging results that were available during my care of the patient were reviewed by me and considered in my medical decision making (see chart for details).   DDX: Dysrhythmia, electrolyte abnormality, anemia, hypothyroid, stress, dehydration  SRIJAN GIVAN is a 57 y.o. who presents to the ED with symptoms as described above.  Patient is AFVSS in ED. Exam as above. Given current presentation have considered the above differential.  The patient will be placed on continuous pulse oximetry and telemetry for monitoring.  Laboratory evaluation will be sent to evaluate for the above complaints.   Unlikely ACS or ischemic event.  Seems to be having symptomatic PVCs.  Will monitor on telemetry and check blood work.  Clinical Course as of Dec 05 1128  Sun Dec 05, 2017  0954 She reassessed.  Still having occasional PACs on monitor but no evidence of other associated ectopy.  No pain.  Blood work is otherwise reassuring.  TSH is normal.  At this point do believe patient stable and appropriate for outpatient follow-up.   [PR]    Clinical Course User Index [PR] Merlyn Lot, MD     As part of my medical decision making, I reviewed the following data within the [facility] notes reviewed and incorporated, Labs reviewed, notes from prior ED visits.   ____________________________________________   FINAL CLINICAL IMPRESSION(S) / ED DIAGNOSES  Final diagnoses:  Palpitations      NEW MEDICATIONS STARTED DURING THIS VISIT:  New Prescriptions   No medications on file     Note:  This document was prepared using Dragon voice recognition software and may include unintentional dictation errors.    Merlyn Lot, MD 12/05/17 [phone]

## 2017-12-08 NOTE — Progress Notes (Signed)
Cardiology Office Note  Date:  12/09/2017   ID:  AVANT PRINTY, DOB 02-09-1961, MRN [id number]  PCP:  Abner Greenspan, MD   Chief Complaint  Patient presents with  . other    Follow up from [facility] ER; palpitations. Meds reviewed by the pt. verbally. "doing well."     HPI:  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 He had significant thrombus. DES was placed to the distal RCA, aspiration thrombectomy with large thrombus revealed. Started on aspirin and brilinta. No other significant disease noted. He presents today for routine follow-up of his coronary artery disease  Seen in the emergency room 12/05/2017 for palpitations Hospital records reviewed with the patient in detail Feels like his heart is skipping a beat , not racing Felt to be having symptomatic PACs Although emergency room note also detail PACs Was having bowel issue  He was in the hospital for right knee replacement July 2044m, redo [name]   Has OSA, As not had recent sleep study Does not sleep well  Weight continues to run high Eating the wrong foods Recently restarted going to the gym  Lab work reviewed Glucose numbers running high, discussed that he is prediabetic Needs to change his lifestyle  Lipids at goal  EKG personally reviewed by myself on todays visit  shows normal sinus rhythm rate 73 bpm no significant ST or T-wave changes  Other past medical history reviewed remote history of ABD surgery Colon resection 2013 Followed by Hernia surgery 2013 Surgical resection of kidney  05/08/2016  found mass in kidney, cancer, aggressive nature  2017 lab work Total chol 125, LDL 52  Echocardiogram in the hospital was essentially normal with normal ejection fraction estimated at greater than 55%, normal right ventricular systolic pressure, no significant wall motion  abnormality.    PMH:   has a past medical history of Arthritis, Cancer ([place]), Childhood asthma, Chronic kidney disease, Coronary artery disease, CVA (cerebral infarction) (10/2010), Diverticulosis (7/12), ED (erectile dysfunction), Heart murmur, History of kidney cancer, History of kidney stones, Hyperlipidemia, Hypertension, Kidney stones, OSA (obstructive sleep apnea), Pes planus, STEMI (ST elevation myocardial infarction) ([place]) (07/2012), Stroke ([place]), and Testosterone deficiency.  PSH:    Past Surgical History:  Procedure Laterality Date  . ABDOMINAL EXPLORATION SURGERY  05/07/2014   w/LO;&  Repair multiple, incarcerated incisional hernias with mesh  . CARDIAC CATHETERIZATION    . carotid dopplers  8/12   normal - after cva   . COLON SURGERY     due to diverticulosis  . CORONARY ANGIOPLASTY WITH STENT PLACEMENT  07/2012   to the distal RCA  . EXTRACORPOREAL SHOCK WAVE LITHOTRIPSY  2014  . HEMICOLECTOMY  10/2010   for diverticulosis, complic by leaking anastamosis/ abcess/ addn surg and iliostomy   . HERNIA REPAIR  05/07/2014   8 repairs  . ILEOSTOMY  10/2010  . ILEOSTOMY CLOSURE  06/2011  . INCISIONAL HERNIA REPAIR N/A 05/07/2014   Procedure: REPAIR RECURRENT INCISIONAL HERNIA;  Surgeon: Fanny Skates, MD;  Location: [place];  Service: General;  Laterality: N/A;  . INSERTION OF MESH N/A 05/07/2014   Procedure: INSERTION OF MESH;  Surgeon: Fanny Skates, MD;  Location: [place];  Service: General;  Laterality: N/A;  . JOINT REPLACEMENT     right knee  . KNEE ARTHROTOMY Right 10/28/2017   Procedure: KNEE OPEN LATERAL RELEASE  WITH MEDIAL CAPSULE REEFING;  Surgeon: [name], MD;  Location: [facility];  Service: Orthopedics;  Laterality: Right;  . ROBOTIC ASSITED PARTIAL NEPHRECTOMY Right 05/08/2016   Procedure: ROBOTIC ASSITED RETROPERITONEAL PARTIAL NEPHRECTOMY;  Surgeon: Alexis Frock, MD;  [facility];  Service: Urology;  Laterality: Right;  . TESTICLE SURGERY     as a child; "they  didn't descend"  . TONSILLECTOMY AND ADENOIDECTOMY     as a child  . TOTAL KNEE ARTHROPLASTY Right 08/17/2017   Procedure: TOTAL KNEE ARTHROPLASTY;  Surgeon: [name], MD;  Location: [facility];  Service: Orthopedics;  Laterality: Right;  Marland Kitchen VASECTOMY      Current Outpatient Medications  Medication Sig Dispense Refill  . aspirin EC 81 MG tablet Take 162 mg by mouth daily.     Marland Kitchen atorvastatin (LIPITOR) 40 MG tablet TAKE ONE TABLET BY MOUTH ONCE DAILY (Patient taking differently: Take 40 mg by mouth daily at 6 PM. ) 90 tablet 3  . azelastine (ASTELIN) 0.1 % nasal spray Place 1 spray into both nostrils daily. Use in each nostril as directed    . fluticasone (FLONASE) 50 MCG/ACT nasal spray Place 2 sprays into both nostrils daily.    Marland Kitchen HYDROcodone-acetaminophen (NORCO) 7.5-325 MG tablet Take 1-2 tablets by mouth every 4 (four) hours as needed for moderate pain. 30 tablet 0  . ibuprofen (ADVIL,MOTRIN) 200 MG tablet Take 800 mg by mouth 2 (two) times daily as needed for headache or moderate pain.    Marland Kitchen lisinopril (PRINIVIL,ZESTRIL) 10 MG tablet Take 1 tablet (10 mg total) by mouth daily. 90 tablet 3  . loratadine (CLARITIN) 10 MG tablet Take 10 mg by mouth daily.    . metoprolol tartrate (LOPRESSOR) 25 MG tablet TAKE ONE TABLET BY MOUTH TWICE DAILY 180 tablet 3  . nitroGLYCERIN (NITROSTAT) 0.4 MG SL tablet Place 1 tablet (0.4 mg total) under the tongue every 5 (five) minutes as needed for chest pain. 25 tablet 3  . pyridOXINE (VITAMIN B-6) 100 MG tablet Take 100 mg by mouth daily.    . vitamin B-12 (CYANOCOBALAMIN) 1000 MCG tablet Take 1,000 mcg by mouth daily.    Marland Kitchen zolpidem (AMBIEN) 5 MG tablet Take 5 mg by mouth at bedtime as needed for sleep.  0   No current facility-administered medications for this visit.      Allergies:   Patient has no known allergies.   Social History:  The patient  reports that he quit smoking about 39 years ago. His smoking use included cigarettes. He has a 2.00  pack-year smoking history. He has never used smokeless tobacco. He reports that he drinks alcohol. He reports that he does not use drugs.   Family History:   family history includes Alcohol abuse in his mother; Cancer in his sister; Heart disease in his father; Heart disease (age of onset: 9) in his mother; Hypertension in his father and mother.    Review of Systems: Review of Systems  Constitutional: Negative.        Weight gain  HENT: Negative.   Respiratory: Negative.   Cardiovascular: Negative.   Gastrointestinal: Negative.   Musculoskeletal: Positive for joint pain.  Neurological: Negative.   Psychiatric/Behavioral: Negative.   All other systems reviewed and are negative.    PHYSICAL EXAM: VS:  BP (!) 142/88 (BP Location: Left Arm, Patient Position: Sitting, Cuff Size: Large)   Pulse 73   Ht 5\' 9"  (1.753 m)   Wt 272 lb 8 oz (123.6 kg)  BMI 40.24 kg/m  , BMI Body mass index is 40.24 kg/m.  No significant change in exam Constitutional:  oriented to person, place, and time. No distress. Obese HENT:  Head: Normocephalic and atraumatic.  Eyes:  no discharge. No scleral icterus.  Neck: Normal range of motion. Neck supple. No JVD present.  Cardiovascular: Normal rate, regular rhythm, normal heart sounds and intact distal pulses. Exam reveals no gallop and no friction rub. No edema No murmur heard. Pulmonary/Chest: Effort normal and breath sounds normal. No stridor. No respiratory distress.  no wheezes.  no rales.  no tenderness.  Abdominal: Soft.  no distension.  no tenderness.  Musculoskeletal: Normal range of motion.  no  tenderness or deformity.  Well healed right knee incision Neurological:  normal muscle tone. Coordination normal. No atrophy Skin: Skin is warm and dry. No rash noted. not diaphoretic.  Psychiatric:  normal mood and affect. behavior is normal. Thought content normal.   Recent Labs: 07/09/2017: ALT 28 12/05/2017: BUN 19; Creatinine, Ser 0.92; Hemoglobin  14.0; Magnesium 2.0; Platelets 174; Potassium 4.4; Sodium 143; TSH 1.939    Lipid Panel Lab Results  Component Value Date   CHOL 116 07/09/2017   HDL 47 07/09/2017   LDLCALC 41 07/09/2017   TRIG 138 07/09/2017      Wt Readings from Last 3 Encounters:  12/09/17 272 lb 8 oz (123.6 kg)  12/05/17 262 lb (118.8 kg)  10/28/17 265 lb 9.6 oz (120.5 kg)       ASSESSMENT AND PLAN:   ST elevation myocardial infarction (STEMI), unspecified artery (HCC)  stent to his distal RCA Now on aspirin alone No anginal symptoms [place] regular exercise program  Hyperlipidemia, unspecified hyperlipidemia type Cholesterol is at goal on the current lipid regimen. No changes to the medications were made.Stable  HYPERTENSION, CONTROLLED Blood pressure is well controlled on today's visit. No changes made to the medications.  Coronary artery disease involving native coronary artery of native heart without angina pectoris Currently with no symptoms of angina. No further workup at this time. Continue current medication regimen.  Stable  Disposition:   F/U  12 months   Total encounter time more than 25 minutes  Greater than 50% was spent in counseling and coordination of care with the patient   Signed, Esmond Plants, M.D., Ph.D. 12/09/2017  [place], [place]

## 2017-12-09 ENCOUNTER — Encounter: Payer: Self-pay | Admitting: Cardiovascular Disease

## 2017-12-09 ENCOUNTER — Ambulatory Visit (INDEPENDENT_AMBULATORY_CARE_PROVIDER_SITE_OTHER): Payer: BC Managed Care – PPO | Admitting: Cardiovascular Disease

## 2017-12-09 VITALS — BP 142/88 | HR 73 | Ht 69.0 in | Wt 272.5 lb

## 2017-12-09 DIAGNOSIS — I10 Essential (primary) hypertension: Secondary | ICD-10-CM

## 2017-12-09 DIAGNOSIS — I25118 Atherosclerotic heart disease of native coronary artery with other forms of angina pectoris: Secondary | ICD-10-CM

## 2017-12-09 DIAGNOSIS — E785 Hyperlipidemia, unspecified: Secondary | ICD-10-CM

## 2017-12-09 MED ORDER — LISINOPRIL 10 MG PO TABS: 10.0000 mg | ORAL_TABLET | Freq: Every day | ORAL | 3 refills | Status: DC

## 2017-12-09 NOTE — Patient Instructions (Signed)

## 2017-12-18 ENCOUNTER — Other Ambulatory Visit: Payer: Self-pay | Admitting: Cardiovascular Disease

## 2018-01-28 ENCOUNTER — Other Ambulatory Visit: Payer: Self-pay | Admitting: Otolaryngology

## 2018-01-28 DIAGNOSIS — R42 Dizziness and giddiness: Secondary | ICD-10-CM

## 2018-01-28 DIAGNOSIS — R51 Headache: Secondary | ICD-10-CM

## 2018-01-28 DIAGNOSIS — G501 Atypical facial pain: Secondary | ICD-10-CM

## 2018-01-28 DIAGNOSIS — H903 Sensorineural hearing loss, bilateral: Secondary | ICD-10-CM

## 2018-01-28 DIAGNOSIS — R519 Headache, unspecified: Secondary | ICD-10-CM

## 2018-01-28 DIAGNOSIS — IMO0001 Reserved for inherently not codable concepts without codable children: Secondary | ICD-10-CM

## 2018-02-11 ENCOUNTER — Ambulatory Visit
Admission: RE | Admit: 2018-02-11 | Discharge: 2018-02-11 | Disposition: A | Discharge status: Home / Self Care | Payer: BC Managed Care – PPO | Source: Ambulatory Visit | Attending: Otolaryngology | Admitting: Otolaryngology

## 2018-02-11 DIAGNOSIS — H903 Sensorineural hearing loss, bilateral: Secondary | ICD-10-CM | POA: Diagnosis not present

## 2018-02-11 DIAGNOSIS — R42 Dizziness and giddiness: Secondary | ICD-10-CM | POA: Diagnosis not present

## 2018-02-11 DIAGNOSIS — IMO0001 Reserved for inherently not codable concepts without codable children: Secondary | ICD-10-CM

## 2018-02-11 DIAGNOSIS — G501 Atypical facial pain: Secondary | ICD-10-CM | POA: Insufficient documentation

## 2018-02-11 LAB — POCT I-STAT CREATININE: CREATININE: 1 mg/dL (ref 0.61–1.24)

## 2018-02-11 MED ORDER — GADOBUTROL 1 MMOL/ML IV SOLN
10.0000 mL | Freq: Once | INTRAVENOUS | Status: AC | PRN
  Administered 2018-02-11: 10 mL via INTRAVENOUS

## 2018-10-05 ENCOUNTER — Other Ambulatory Visit: Payer: Self-pay | Admitting: Cardiovascular Disease

## 2018-12-23 ENCOUNTER — Other Ambulatory Visit: Payer: Self-pay

## 2018-12-23 MED ORDER — LISINOPRIL 10 MG PO TABS: 10.0000 mg | ORAL_TABLET | Freq: Every day | ORAL | 0 refills | Status: DC

## 2019-01-02 ENCOUNTER — Other Ambulatory Visit: Payer: Self-pay | Admitting: Cardiovascular Disease

## 2019-01-16 ENCOUNTER — Other Ambulatory Visit: Payer: Self-pay | Admitting: Cardiovascular Disease

## 2019-01-16 MED ORDER — ATORVASTATIN CALCIUM 40 MG PO TABS: 40.0000 mg | ORAL_TABLET | Freq: Every day | ORAL | 0 refills | Status: DC

## 2019-01-16 NOTE — Telephone Encounter (Signed)
°*  STAT* If patient is at the pharmacy, call can be transferred to refill team.   1. Which medications need to be refilled? (please list name of each medication and dose if known) Lipitor 40 mg daily  2. Which pharmacy/location (including street and city if local pharmacy) is medication to be sent to? [facility] in [place]  3. Do they need a 30 day or 90 day supply? [place]

## 2019-01-29 NOTE — Progress Notes (Signed)
Cardiology Office Note  Date:  01/30/2019   ID:  DIO BETHELL, DOB 26-Oct-1960, MRN [id number]  PCP:  Abner Greenspan, MD   Chief Complaint  Patient presents with  . other    12 month follow up. Meds reviewed by the pt. verbally. "doing well."     HPI:  Mr. Difranco is a pleasant 58 year old gentleman who installs air conditioning units at [employer]  Hx of CAD  presented to the hospital August 05 2012 with STEMI, occluded distal RCA. He had significant thrombus. DES was placed to the distal RCA, aspiration thrombectomy with large thrombus revealed. Started on aspirin and brilinta. No other significant disease noted. He presents today for routine follow-up of his coronary artery disease  In general he reports he is feeling well with no complaints Continues to work at [employer] Denies any chest pain concerning for angina  No recent palpitations Can't eat banana, 2-3 a week, gets palpitations As in 2019  Weight down , watching diet No regular exercise program   right knee replacement July 2019, redo [name]   Has OSA, As not had recent sleep study Does not sleep well  EKG personally reviewed by myself on todays visit  shows normal sinus rhythm rate 60 bpm no significant ST or T-wave changes  Other past medical history reviewed Seen in the emergency room 12/05/2017 for palpitations Feels like his heart is skipping a beat , not racing Felt to be having symptomatic PACs Although emergency room note also detail PACs Was having bowel issue  remote history of ABD surgery Colon resection 2013 Followed by Hernia surgery 2013 Surgical resection of kidney  05/08/2016  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   PMH:   has a past medical history  of Arthritis, Cancer ([place]), Childhood asthma, Chronic kidney disease, Coronary artery disease, CVA (cerebral infarction) (10/2010), Diverticulosis (7/12), ED (erectile dysfunction), Heart murmur, History of kidney cancer, History of kidney stones, Hyperlipidemia, Hypertension, Kidney stones, OSA (obstructive sleep apnea), Pes planus, STEMI (ST elevation myocardial infarction) ([place]) (07/2012), Stroke ([place]), and Testosterone deficiency.  PSH:    Past Surgical History:  Procedure Laterality Date  . ABDOMINAL EXPLORATION SURGERY  05/07/2014   w/LO;&  Repair multiple, incarcerated incisional hernias with mesh  . CARDIAC CATHETERIZATION    . carotid dopplers  8/12   normal - after cva   . COLON SURGERY     due to diverticulosis  . CORONARY ANGIOPLASTY WITH STENT PLACEMENT  07/2012   to the distal RCA  . EXTRACORPOREAL SHOCK WAVE LITHOTRIPSY  2014  . HEMICOLECTOMY  10/2010   for diverticulosis, complic by leaking anastamosis/ abcess/ addn surg and iliostomy   . HERNIA REPAIR  05/07/2014   8 repairs  . ILEOSTOMY  10/2010  . ILEOSTOMY CLOSURE  06/2011  . INCISIONAL HERNIA REPAIR N/A 05/07/2014   Procedure: REPAIR RECURRENT INCISIONAL HERNIA;  Surgeon: Fanny Skates, MD;  Location: [facility];  Service: General;  Laterality: N/A;  . INSERTION OF MESH N/A 05/07/2014   Procedure: INSERTION OF MESH;  Surgeon: Fanny Skates, MD;  Location: [facility];  Service: General;  Laterality: N/A;  . JOINT REPLACEMENT     right knee  . KNEE ARTHROTOMY Right 10/28/2017   Procedure: KNEE OPEN LATERAL RELEASE WITH MEDIAL CAPSULE REEFING;  Surgeon: [name], MD;  Location: [facility];  Service: Orthopedics;  Laterality: Right;  . ROBOTIC ASSITED PARTIAL NEPHRECTOMY Right 05/08/2016   Procedure: ROBOTIC ASSITED RETROPERITONEAL PARTIAL NEPHRECTOMY;  Surgeon: Alexis Frock, MD;  [facility];  Service: Urology;  Laterality: Right;  . TESTICLE SURGERY     as a child; "they didn't descend"  . TONSILLECTOMY AND ADENOIDECTOMY      as a child  . TOTAL KNEE ARTHROPLASTY Right 08/17/2017   Procedure: TOTAL KNEE ARTHROPLASTY;  Surgeon: [name], MD;  Location: [facility];  Service: Orthopedics;  Laterality: Right;  Marland Kitchen VASECTOMY      Current Outpatient Medications  Medication Sig Dispense Refill  . aspirin EC 81 MG tablet Take 162 mg by mouth daily.     Marland Kitchen atorvastatin (LIPITOR) 40 MG tablet Take 1 tablet (40 mg total) by mouth daily. 90 tablet 0  . azelastine (ASTELIN) 0.1 % nasal spray Place 1 spray into both nostrils daily. Use in each nostril as directed    . fluticasone (FLONASE) 50 MCG/ACT nasal spray Place 2 sprays into both nostrils daily.    Marland Kitchen HYDROcodone-acetaminophen (NORCO) 7.5-325 MG tablet Take 1-2 tablets by mouth every 4 (four) hours as needed for moderate pain. 30 tablet 0  . ibuprofen (ADVIL,MOTRIN) 200 MG tablet Take 800 mg by mouth 2 (two) times daily as needed for headache or moderate pain.    Marland Kitchen lisinopril (ZESTRIL) 20 MG tablet Take 1 tablet (20 mg total) by mouth daily. 90 tablet 3  . loratadine (CLARITIN) 10 MG tablet Take 10 mg by mouth daily.    . meloxicam (MOBIC) 15 MG tablet Take 15 mg by mouth daily.     . metoprolol tartrate (LOPRESSOR) 25 MG tablet TAKE 1 TABLET BY MOUTH TWICE DAILY 180 tablet 0  . nitroGLYCERIN (NITROSTAT) 0.4 MG SL tablet Place 1 tablet (0.4 mg total) under the tongue every 5 (five) minutes as needed for chest pain. 25 tablet 3  . pyridOXINE (VITAMIN B-6) 100 MG tablet Take 100 mg by mouth daily.    . tadalafil (CIALIS) 5 MG tablet Take 5 mg by mouth daily as needed.     . vitamin B-12 (CYANOCOBALAMIN) 1000 MCG tablet Take 1,000 mcg by mouth daily.    Marland Kitchen zolpidem (AMBIEN) 5 MG tablet Take 5 mg by mouth at bedtime as needed for sleep.  0   No current facility-administered medications for this visit.      Allergies:   Hydromorphone hcl and Sildenafil citrate   Social History:  The patient  reports that he quit smoking about 40 years ago. His smoking use included  cigarettes. He has a 2.00 pack-year smoking history. He has never used smokeless tobacco. He reports current alcohol use. He reports that he does not use drugs.   Family History:   family history includes Alcohol abuse in his mother; Cancer in his sister; Heart disease in his father; Heart disease (age of onset: 2) in his mother; Hypertension in his father and mother.    Review of Systems: Review of Systems  Constitutional: Negative.   HENT: Negative.   Respiratory: Negative.   Cardiovascular: Negative.   Gastrointestinal: Negative.   Musculoskeletal: Positive for joint pain.  Neurological: Negative.   Psychiatric/Behavioral: Negative.   All other systems reviewed and are negative.   PHYSICAL EXAM: VS:  BP (!) 156/80 (BP Location: Left Arm, Patient Position: Sitting, Cuff Size: Normal)   Pulse 60   Temp (!) 97.1 F (36.2 C)  Ht 5\' 9"  (1.753 m)   Wt 259 lb 4 oz (117.6 kg)   BMI 38.28 kg/m  , BMI Body mass index is 38.28 kg/m.  Constitutional:  oriented to person, place, and time. No distress.  HENT:  Head: Grossly normal Eyes:  no discharge. No scleral icterus.  Neck: No JVD, no carotid bruits  Cardiovascular: Regular rate and rhythm, no murmurs appreciated Pulmonary/Chest: Clear to auscultation bilaterally, no wheezes or rails Abdominal: Soft.  no distension.  no tenderness.  Musculoskeletal: Normal range of motion Neurological:  normal muscle tone. Coordination normal. No atrophy Skin: Skin warm and dry Psychiatric: normal affect, pleasant  Recent Labs: 02/11/2018: Creatinine, Ser 1.00    Lipid Panel Lab Results  Component Value Date   CHOL 116 07/09/2017   HDL 47 07/09/2017   LDLCALC 41 07/09/2017   TRIG 138 07/09/2017      Wt Readings from Last 3 Encounters:  01/30/19 259 lb 4 oz (117.6 kg)  12/09/17 272 lb 8 oz (123.6 kg)  12/05/17 262 lb (118.8 kg)     ASSESSMENT AND PLAN:  Coronary artery disease with stable angina Currently with no symptoms of  angina. No further workup at this time. Continue current medication regimen.  stent to his distal RCA  Hyperlipidemia, unspecified hyperlipidemia type Last lipid panel, numbers at goal Continue current medications  HYPERTENSION, CONTROLLED Blood pressure elevated on today's visit also with primary care Recommended increase lisinopril up to 20 mg daily, previously 10 mg daily He does not have blood pressure cuff at home, recommended he consider buying one or borrowing one for further medication titration   Disposition:   F/U  12 months   Total encounter time more than 25 minutes  Greater than 50% was spent in counseling and coordination of care with the patient   Signed, Esmond Plants, M.D., Ph.D. 01/30/2019  [place], [place]

## 2019-01-30 ENCOUNTER — Ambulatory Visit (INDEPENDENT_AMBULATORY_CARE_PROVIDER_SITE_OTHER): Payer: BC Managed Care – PPO | Admitting: Cardiovascular Disease

## 2019-01-30 ENCOUNTER — Other Ambulatory Visit: Payer: Self-pay

## 2019-01-30 ENCOUNTER — Encounter: Payer: Self-pay | Admitting: Cardiovascular Disease

## 2019-01-30 VITALS — BP 156/80 | HR 60 | Temp 97.1°F | Ht 69.0 in | Wt 259.2 lb

## 2019-01-30 DIAGNOSIS — E785 Hyperlipidemia, unspecified: Secondary | ICD-10-CM | POA: Diagnosis not present

## 2019-01-30 DIAGNOSIS — I10 Essential (primary) hypertension: Secondary | ICD-10-CM

## 2019-01-30 DIAGNOSIS — I25118 Atherosclerotic heart disease of native coronary artery with other forms of angina pectoris: Secondary | ICD-10-CM

## 2019-01-30 MED ORDER — LISINOPRIL 20 MG PO TABS: 20.0000 mg | ORAL_TABLET | Freq: Every day | ORAL | 3 refills | Status: DC

## 2019-01-30 NOTE — Patient Instructions (Signed)
Medication Instructions:  Please increase the lisinopril up to 20 mg daily Please call with blood pressure measurements   If you need a refill on your cardiac medications before your next appointment, please call your pharmacy.    Lab work: No new labs needed   If you have labs (blood work) drawn today and your tests are completely normal, you will receive your results only by: Marland Kitchen MyChart Message (if you have MyChart) OR . A paper copy in the mail If you have any lab test that is abnormal or we need to change your treatment, we will call you to review the results.   Testing/Procedures: No new testing needed   Follow-Up: At [facility], you and your health needs are our priority.  As part of our continuing mission to provide you with exceptional heart care, we have created designated Provider Care Teams.  These Care Teams include your primary Cardiologist (physician) and Advanced Practice Providers (APPs -  Physician Assistants and Nurse Practitioners) who all work together to provide you with the care you need, when you need it.  . You will need a follow up appointment in 12 months .   Please call our office 2 months in advance to schedule this appointment.    . Providers on your designated Care Team:   . Murray Hodgkins, NP . Christell Faith, PA-C . Marrianne Mood, PA-C  Any Other Special Instructions Will Be Listed Below (If Applicable).  For educational health videos Log in to : [email or web address] Or : [email or web address], password : [email or web address]

## 2019-03-27 ENCOUNTER — Other Ambulatory Visit: Payer: Self-pay | Admitting: Cardiovascular Disease

## 2019-04-03 ENCOUNTER — Other Ambulatory Visit: Payer: Self-pay | Admitting: Cardiovascular Disease

## 2019-04-12 ENCOUNTER — Other Ambulatory Visit: Payer: Self-pay | Admitting: Cardiovascular Disease

## 2019-05-01 IMAGING — CR DG ABDOMEN 1V
4 series · 4 of 4 positions shown · non-contrast
Comparison: 11/26/2016

CLINICAL DATA: Nausea and vomiting since knee replacement 2 days
ago

EXAM:
ABDOMEN - 1 VIEW

[abdomen kub (1 of 4)]
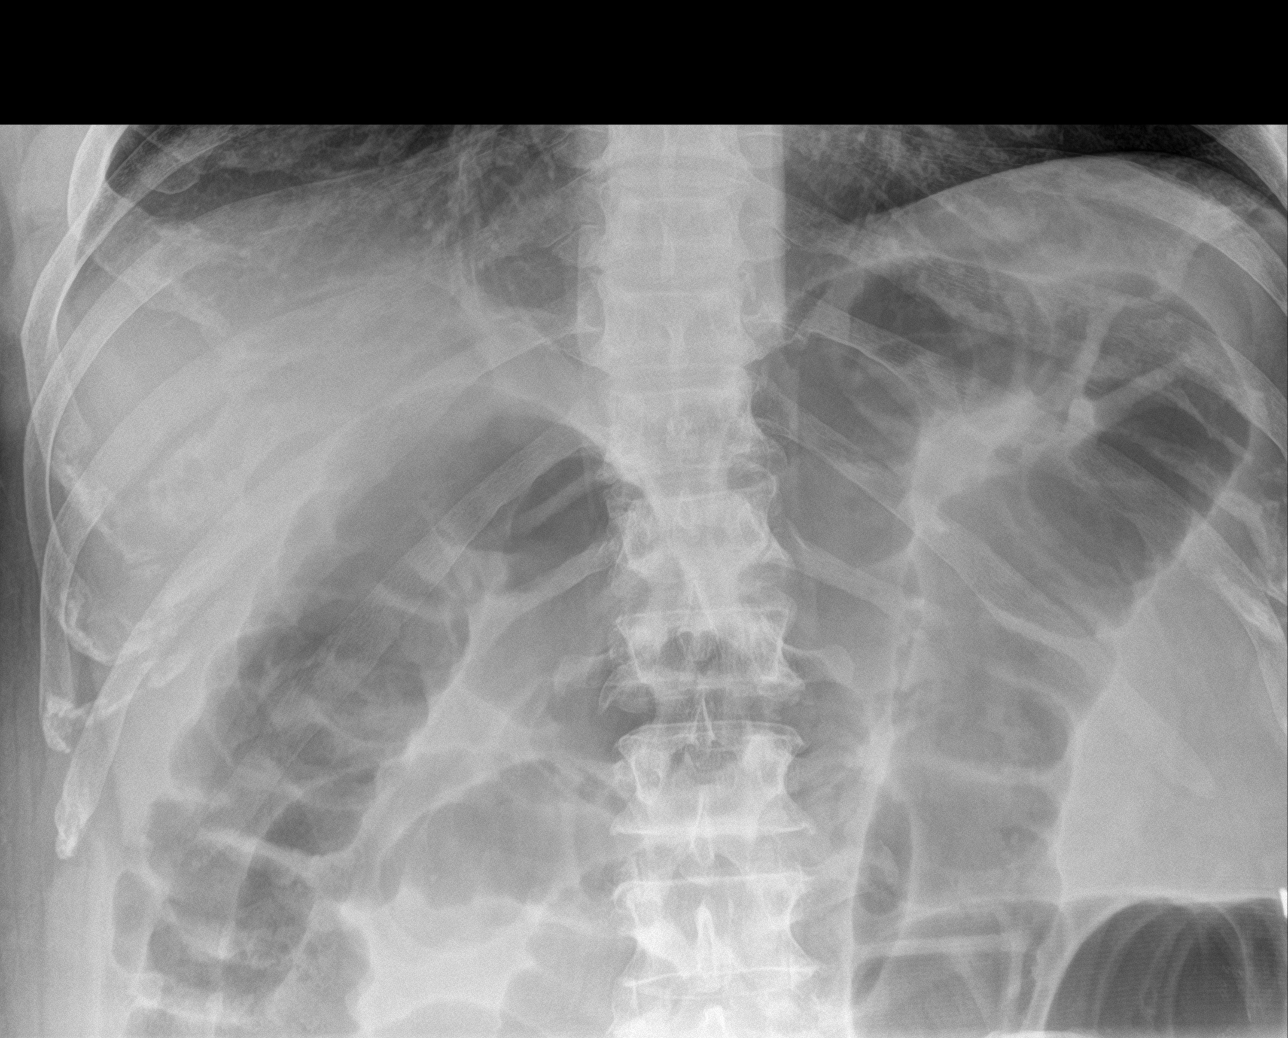

[abdomen kub (2 of 4)]
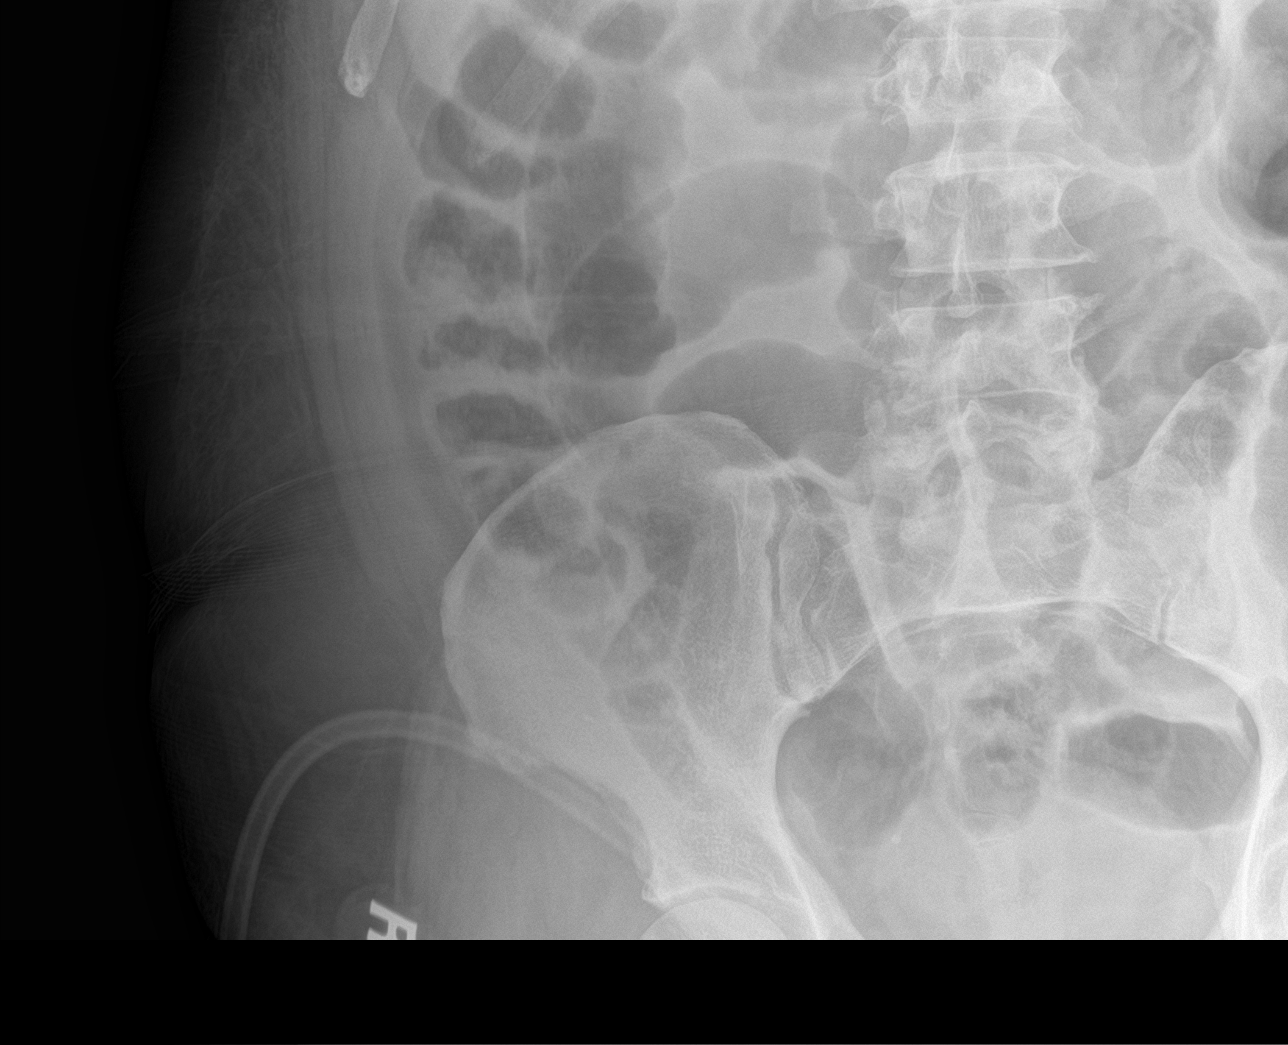

[abdomen kub (3 of 4)]
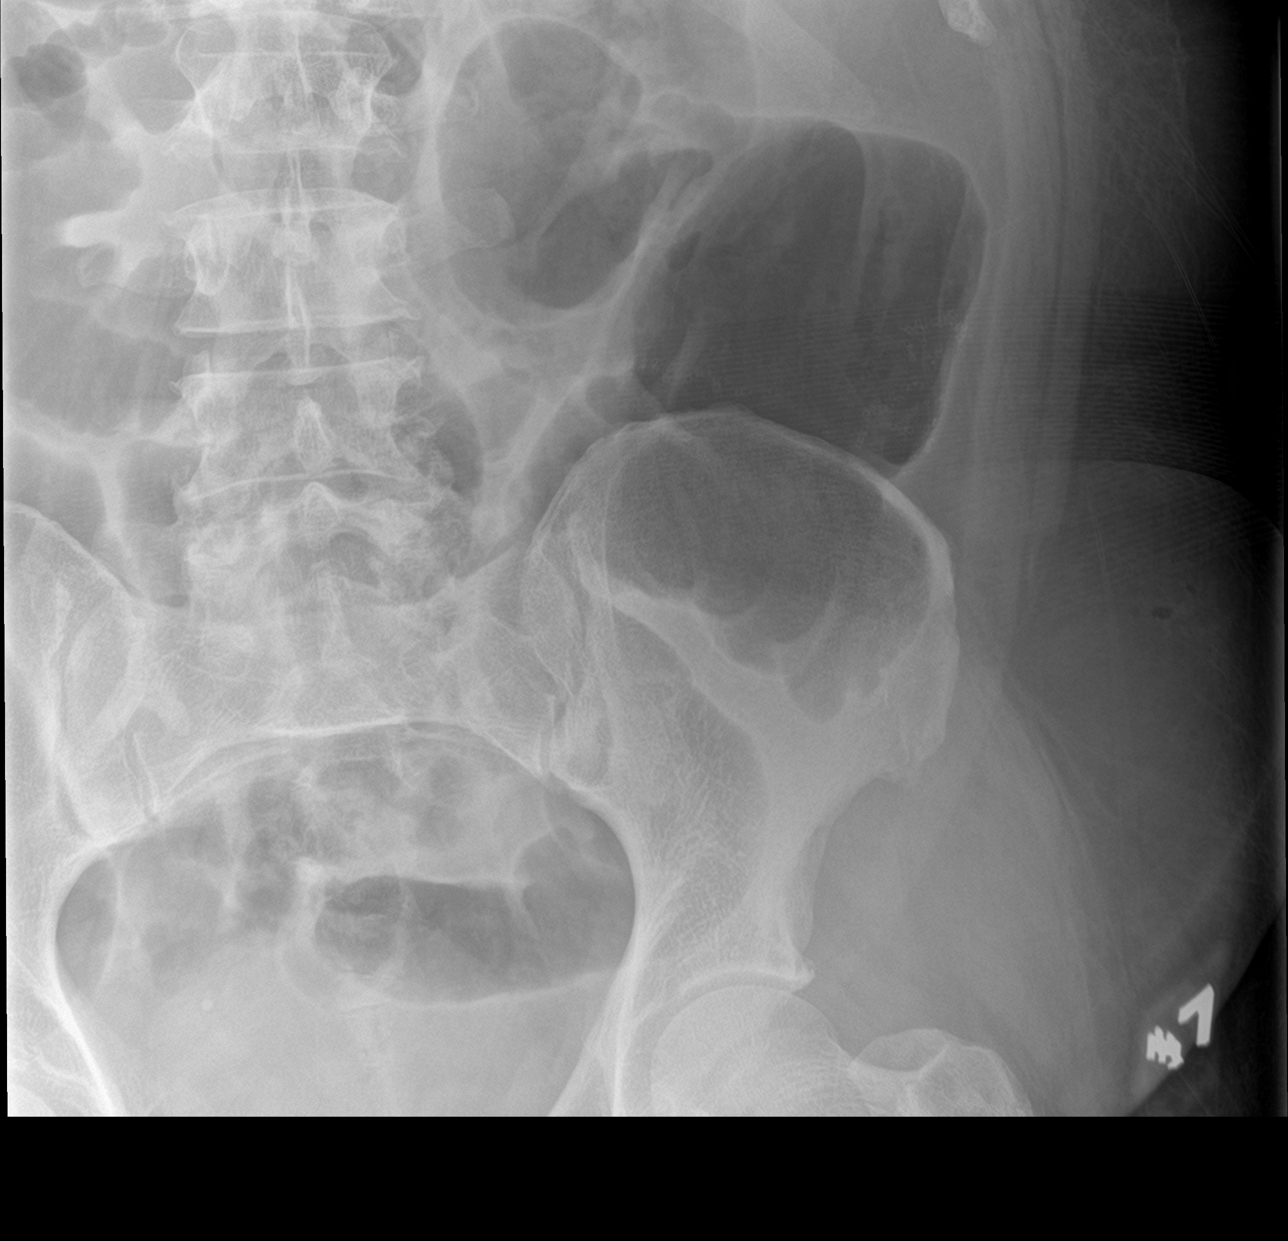

[abdomen kub (4 of 4)]
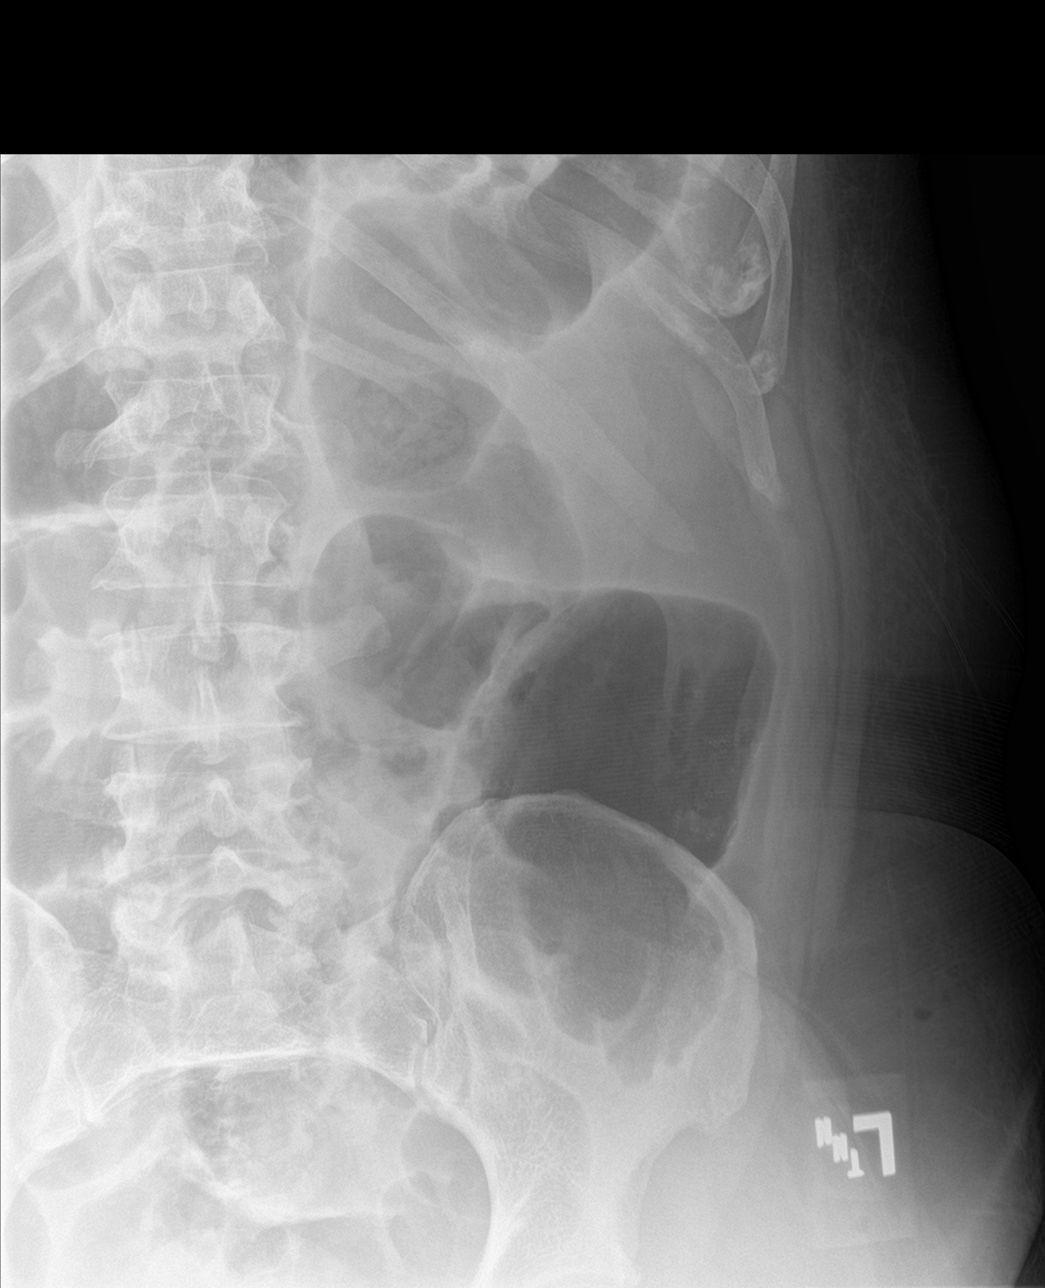

[4 of 4 positions shown; findings below may reference images not displayed]

FINDINGS: Diffuse gaseous distention of colon. A few gas dilated small bowel
loops are seen. Gas reaches the distal sigmoid. Mild right lower
lobe atelectasis.
IMPRESSION: Diffuse gaseous distention of colon compatible with ileus.

## 2019-05-02 IMAGING — CR DG ABDOMEN 1V
1 series · 3 of 3 positions shown · non-contrast
Comparison: 08/19/2017

CLINICAL DATA: Evaluate ileus.

EXAM:
ABDOMEN - 1 VIEW

[Series 1: dg abd 1 view · 0.14mm/px · 3 of 3 slices shown]
[im 1/3]
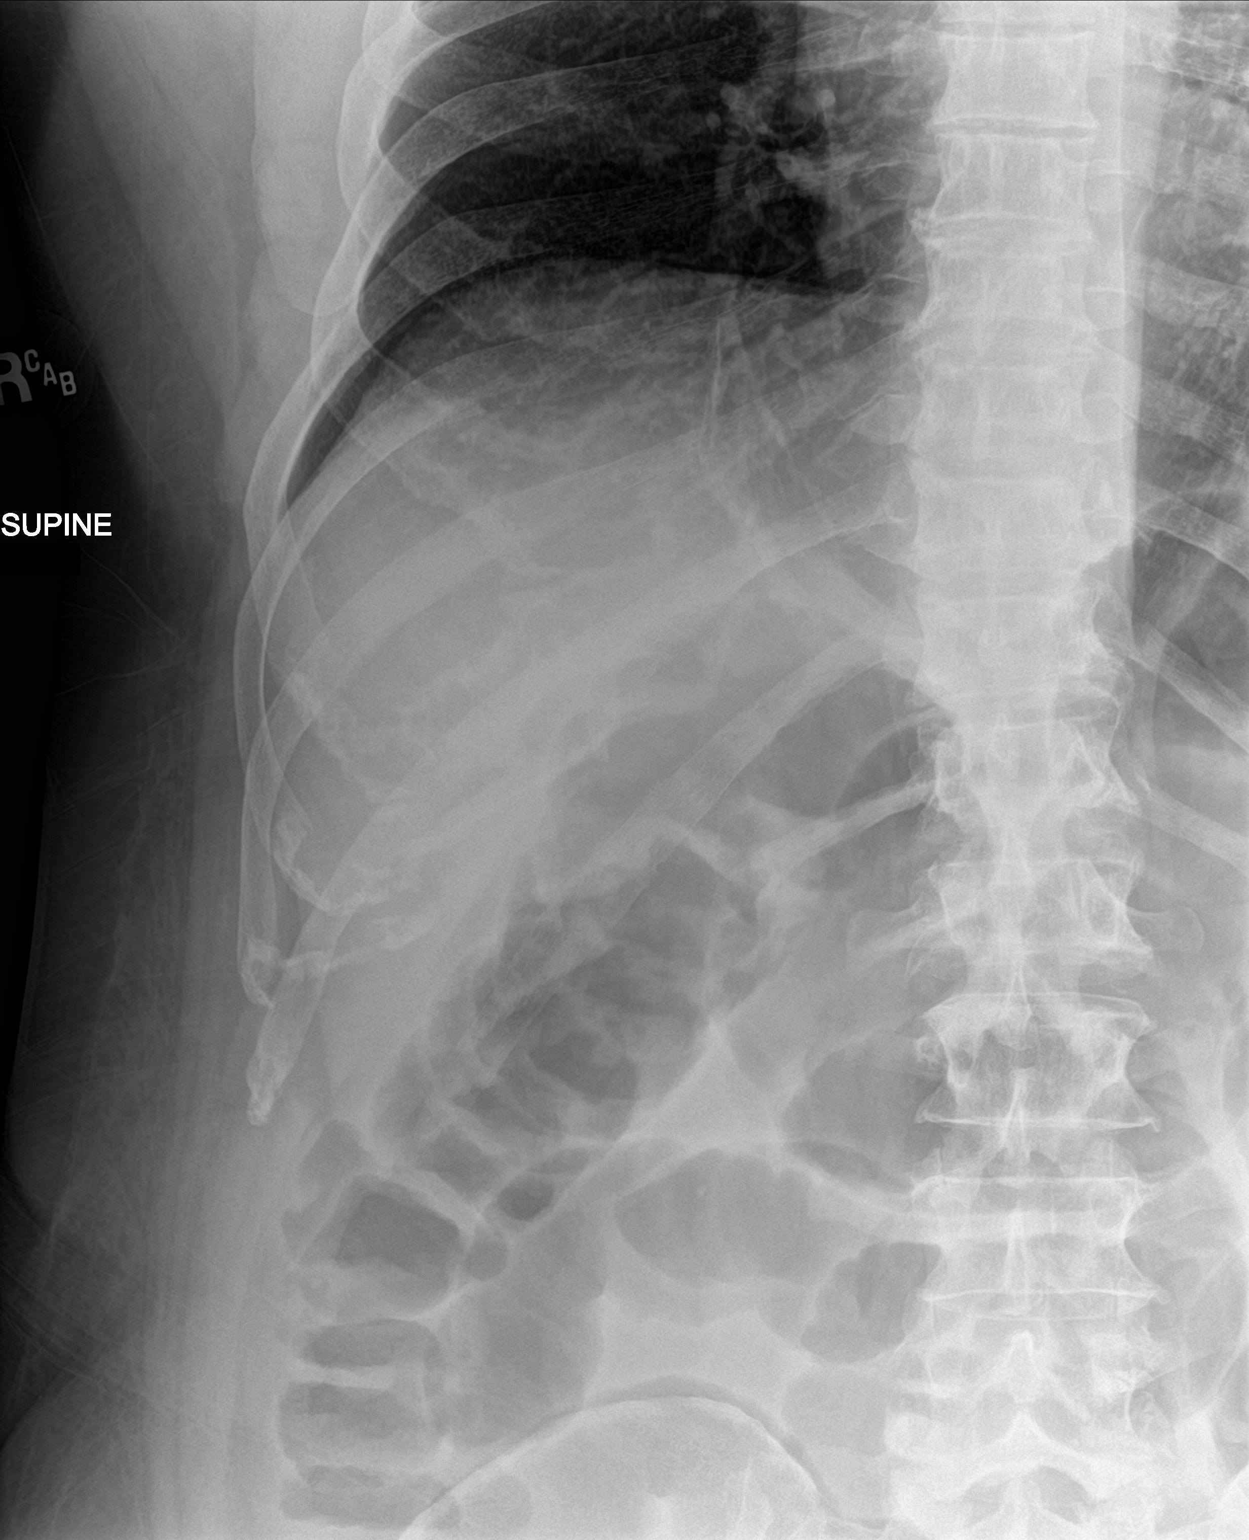
[im 2/3]
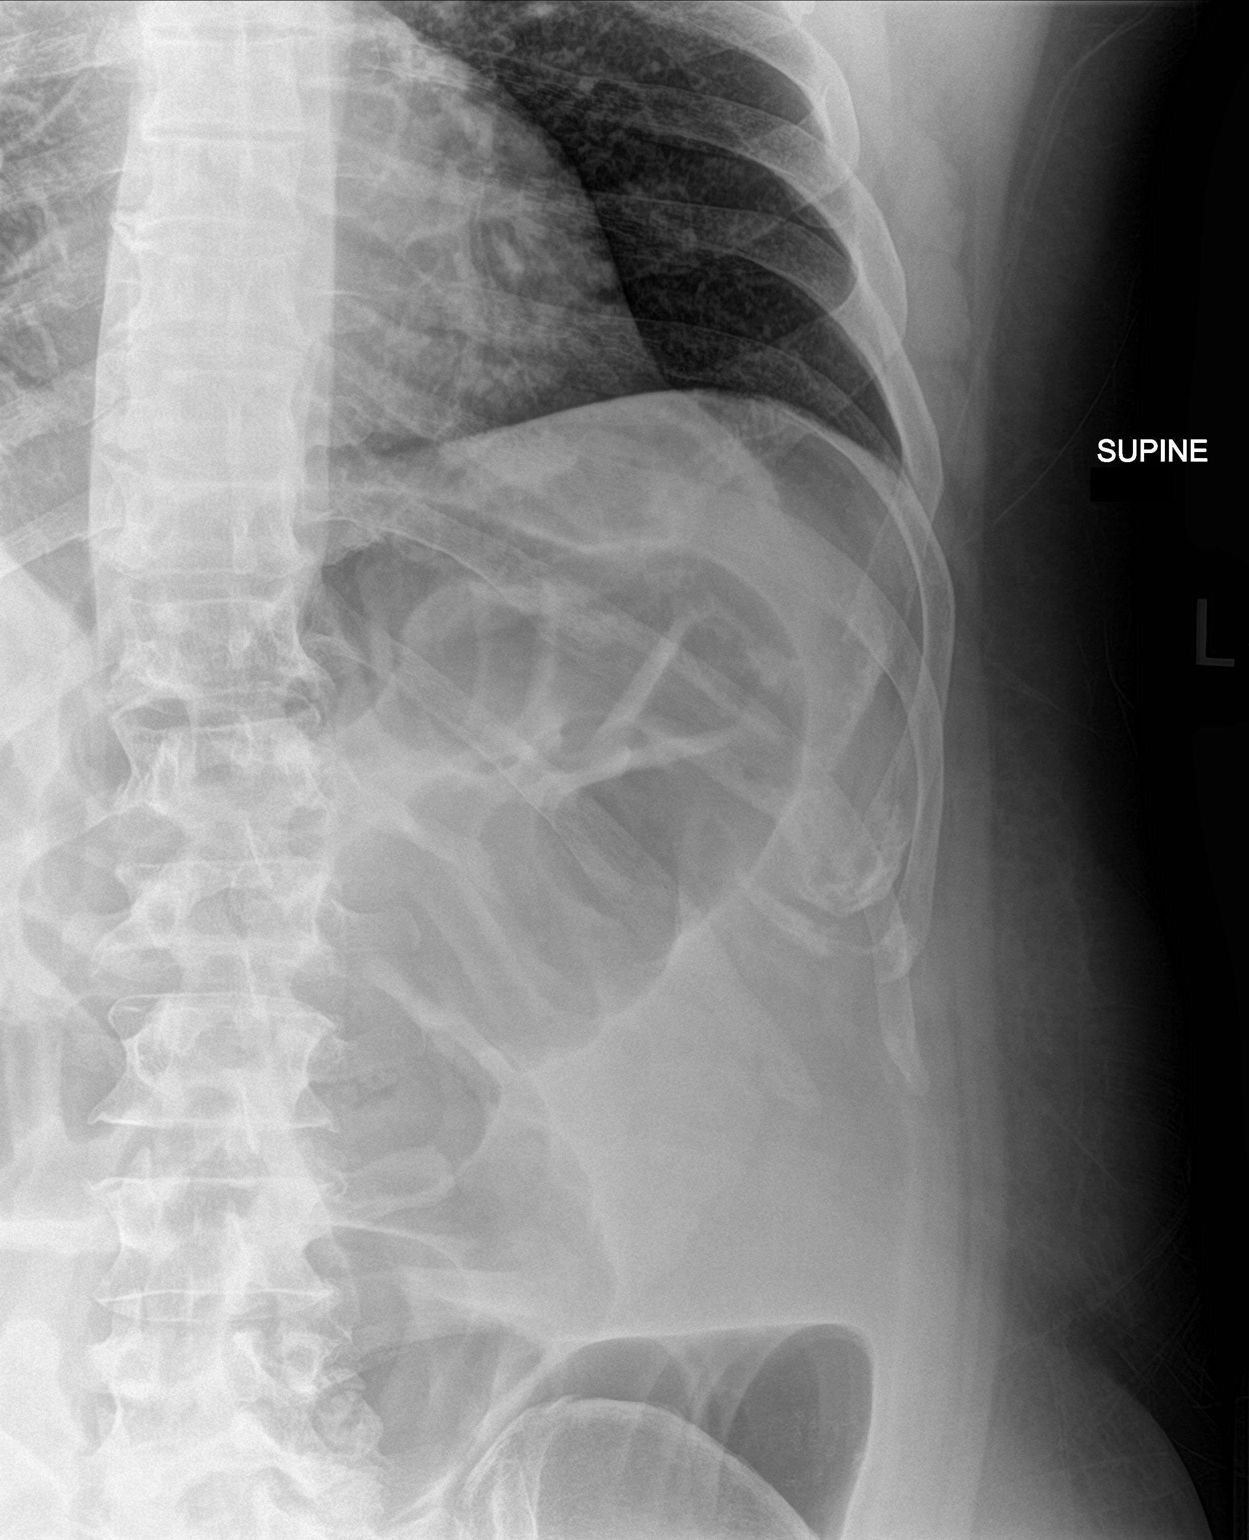
[im 3/3]
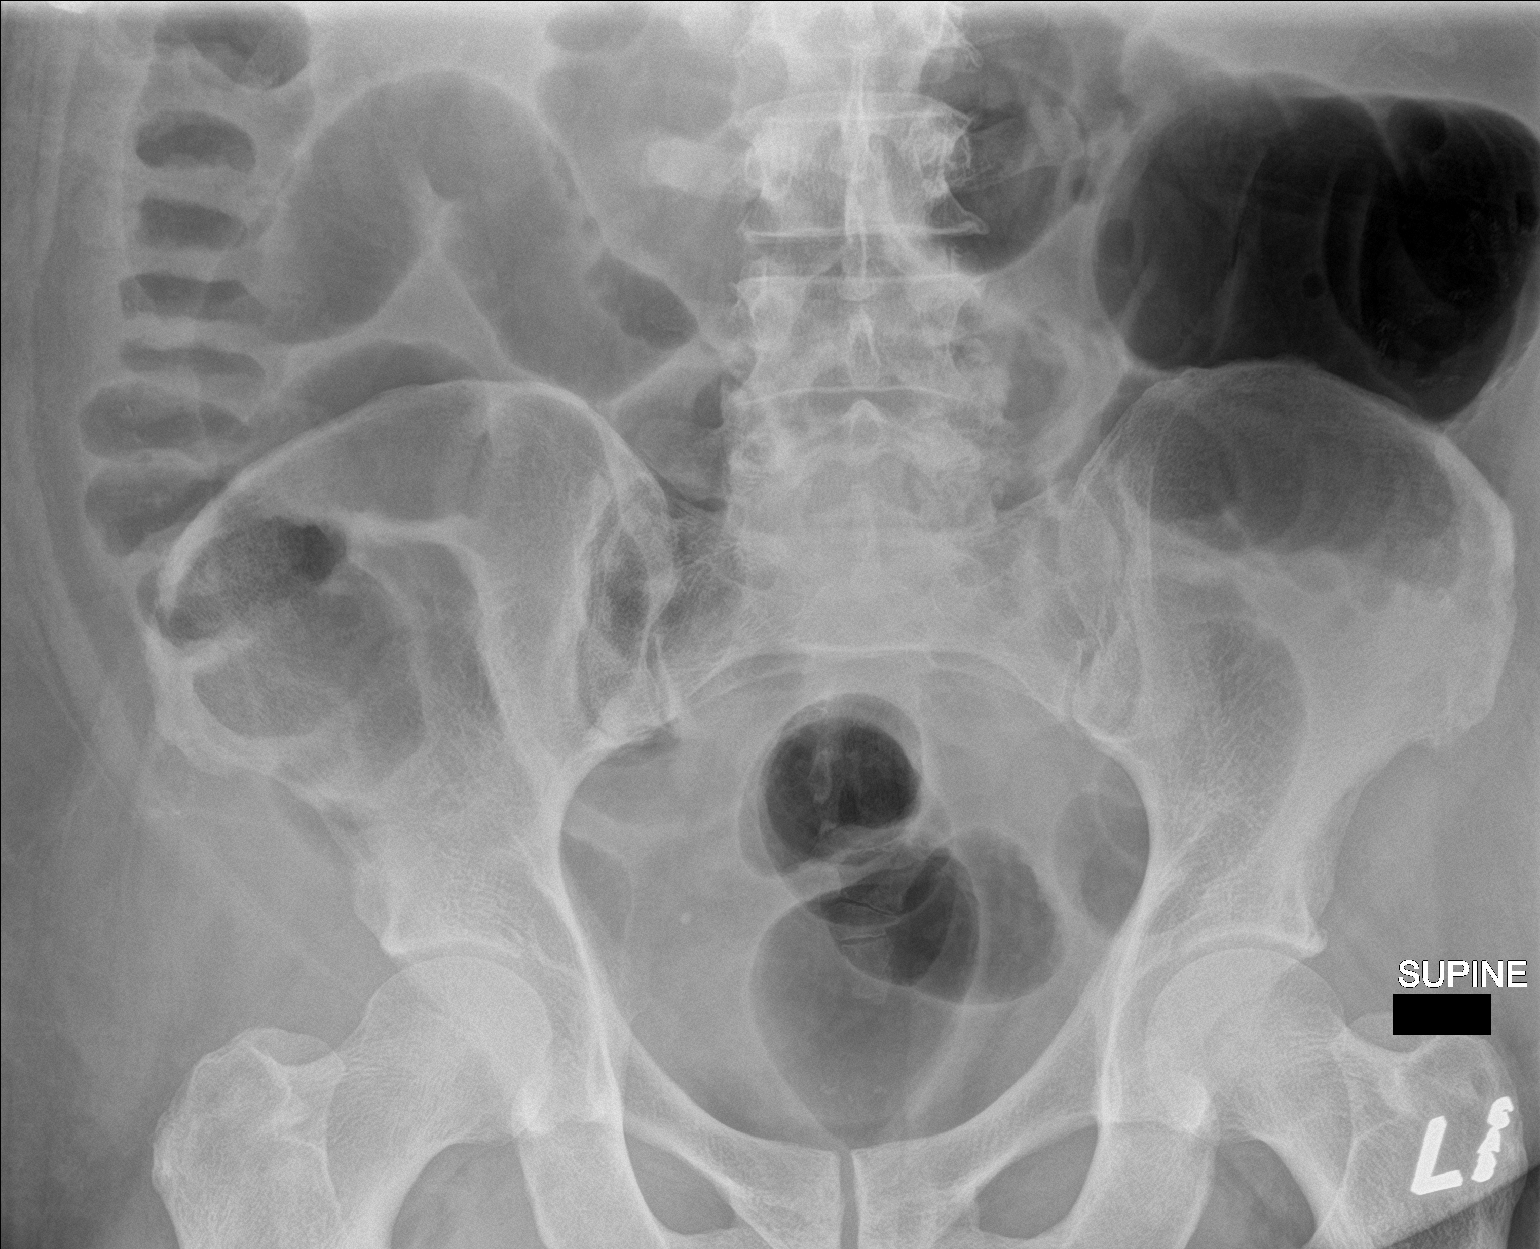

[3 of 3 positions shown; findings below may reference images not displayed]

FINDINGS: Gaseous distention of large and small bowel again noted, not
significantly changed since prior study. No free air organomegaly.
IMPRESSION: Stable ileus pattern.

## 2019-07-17 ENCOUNTER — Other Ambulatory Visit: Payer: Self-pay | Admitting: Cardiovascular Disease

## 2019-08-17 IMAGING — CR DG CHEST 2V
2 series · 2 of 2 positions shown · non-contrast
Comparison: 11/26/2016

CLINICAL DATA: Heart palpitations since yesterday morning
intermittently.

EXAM:
CHEST - 2 VIEW

[chest pa]
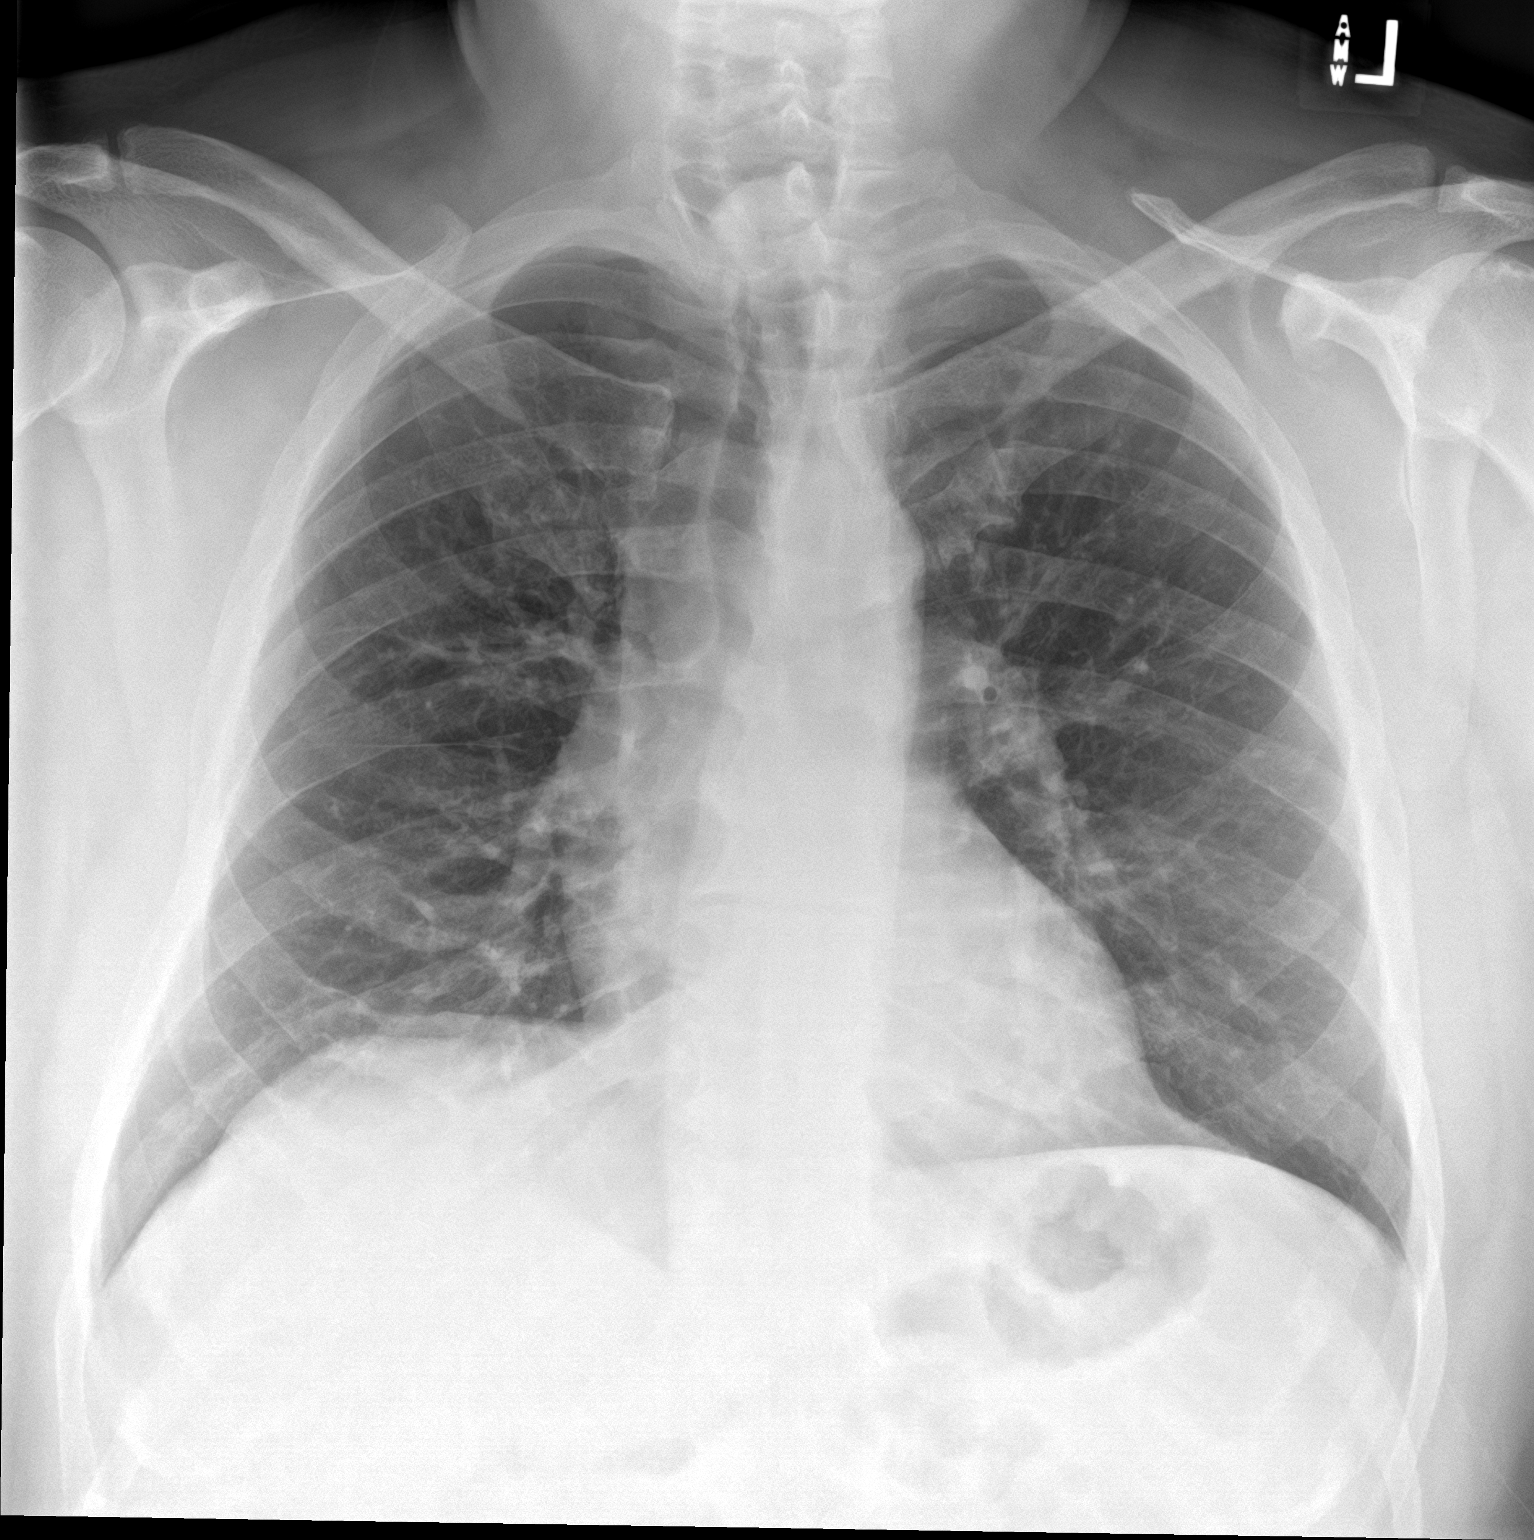

[chest lat]
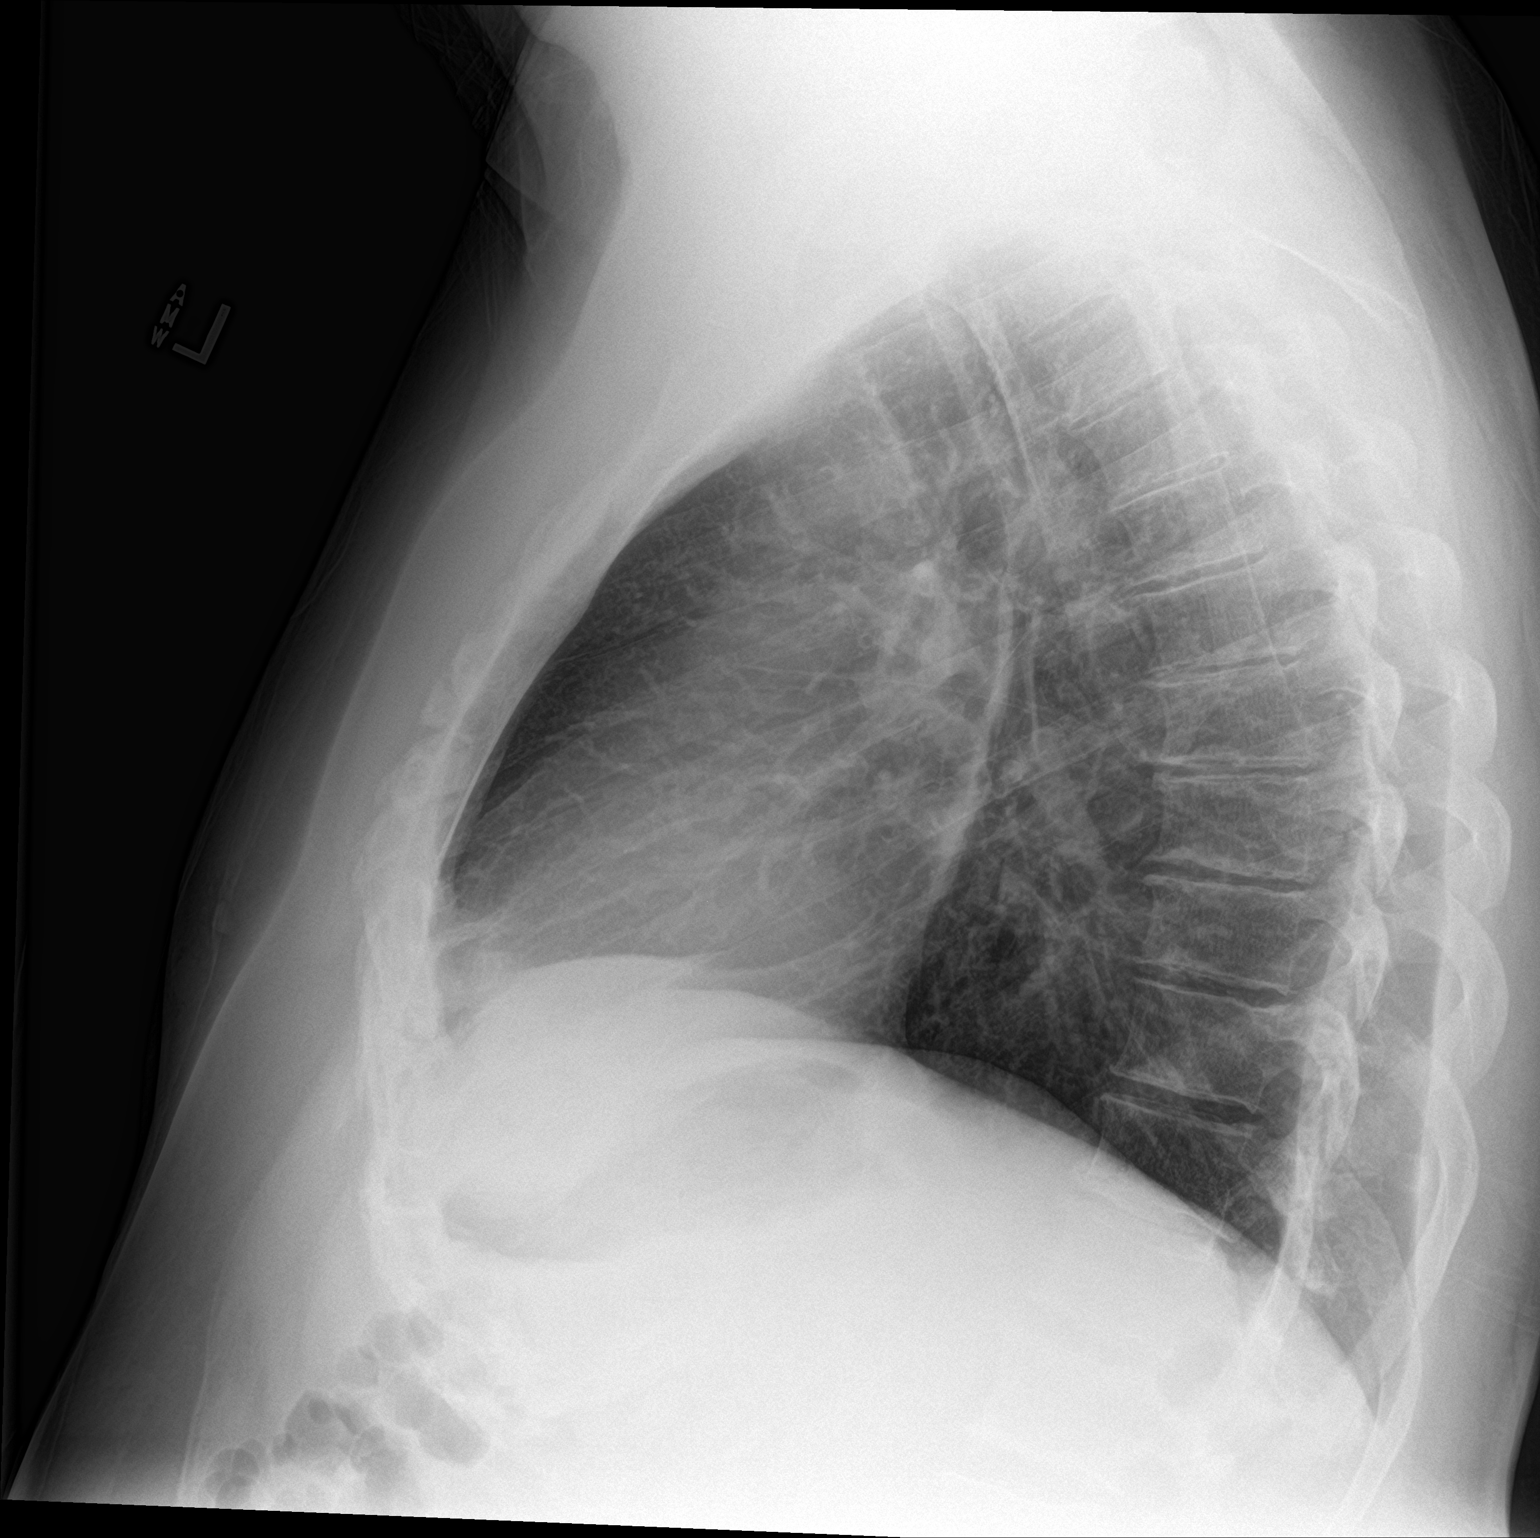

[2 of 2 positions shown; findings below may reference images not displayed]

FINDINGS: Lungs are adequately inflated and otherwise clear. Cardiomediastinal
silhouette is within normal. There is minimal degenerative change of
the spine.
IMPRESSION: No active cardiopulmonary disease.

## 2020-01-27 NOTE — Progress Notes (Signed)
Cardiology Office Note  Date:  01/29/2020   ID:  Jacob Marks, DOB Oct 07, 1960, MRN [id number]  PCP:  Abner Greenspan, MD   Chief Complaint  Patient presents with  . Other    12 month follow up. Meds reviewed verbally with patient.     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 He had significant thrombus. DES was placed to the distal RCA, aspiration thrombectomy with large thrombus revealed. Started on aspirin and brilinta. No other significant disease noted. He presents today for routine follow-up of his coronary artery disease  Still working, does HVAC work through [employer] continues to run [employer] well, overall but does report some symptoms as below Sometimes end of the day, with shoulder pain/burning, Sometimes across chest Happened 2-3 times last week "could be GERD", might be arthritis, he is uncertain  No exercise   right knee replacement July 2019, redo, [name]   Has OSA, uses CPAP Unable to use the CPAP when he sleeps on his side  EKG personally reviewed by myself on todays visit  shows normal sinus rhythm rate 59 bpm no significant ST or T-wave changes  Other past medical history reviewed Seen in the emergency room 12/05/2017 for palpitations Feels like his heart is skipping a beat , not racing Felt to be having symptomatic PACs Although emergency room note also detail PACs Was having bowel issue  remote history of ABD surgery Colon resection 2013 Followed by Hernia surgery 2013 Surgical resection of kidney  05/08/2016  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   PMH:    has a past medical history of Arthritis, Cancer ([place]), Childhood asthma, Chronic kidney disease, Coronary artery disease, CVA (cerebral infarction) (10/2010), Diverticulosis (7/12), ED (erectile dysfunction), Heart murmur, History of kidney cancer, History of kidney stones, Hyperlipidemia, Hypertension, Kidney stones, OSA (obstructive sleep apnea), Pes planus, STEMI (ST elevation myocardial infarction) ([place]) (07/2012), Stroke ([place]), and Testosterone deficiency.  PSH:    Past Surgical History:  Procedure Laterality Date  . ABDOMINAL EXPLORATION SURGERY  05/07/2014   w/LO;&  Repair multiple, incarcerated incisional hernias with mesh  . CARDIAC CATHETERIZATION    . carotid dopplers  8/12   normal - after cva   . COLON SURGERY     due to diverticulosis  . CORONARY ANGIOPLASTY WITH STENT PLACEMENT  07/2012   to the distal RCA  . EXTRACORPOREAL SHOCK WAVE LITHOTRIPSY  2014  . HEMICOLECTOMY  10/2010   for diverticulosis, complic by leaking anastamosis/ abcess/ addn surg and iliostomy   . HERNIA REPAIR  05/07/2014   8 repairs  . ILEOSTOMY  10/2010  . ILEOSTOMY CLOSURE  06/2011  . INCISIONAL HERNIA REPAIR N/A 05/07/2014   Procedure: REPAIR RECURRENT INCISIONAL HERNIA;  Surgeon: Fanny Skates, MD;  Location: [facility];  Service: General;  Laterality: N/A;  . INSERTION OF MESH N/A 05/07/2014   Procedure: INSERTION OF MESH;  Surgeon: Fanny Skates, MD;  Location: [facility];  Service: General;  Laterality: N/A;  . JOINT REPLACEMENT     right knee  . KNEE ARTHROTOMY Right 10/28/2017   Procedure: KNEE OPEN LATERAL RELEASE WITH MEDIAL CAPSULE  REEFING;  Surgeon: [name], MD;  Location: [facility];  Service: Orthopedics;  Laterality: Right;  . ROBOTIC ASSITED PARTIAL NEPHRECTOMY Right 05/08/2016   Procedure: ROBOTIC ASSITED RETROPERITONEAL PARTIAL NEPHRECTOMY;  Surgeon: Alexis Frock, MD;  [facility];  Service: Urology;  Laterality: Right;  . TESTICLE SURGERY     as a child; "they didn't descend"  .  TONSILLECTOMY AND ADENOIDECTOMY     as a child  . TOTAL KNEE ARTHROPLASTY Right 08/17/2017   Procedure: TOTAL KNEE ARTHROPLASTY;  Surgeon: [name], MD;  Location: [facility];  Service: Orthopedics;  Laterality: Right;  Marland Kitchen VASECTOMY      Current Outpatient Medications  Medication Sig Dispense Refill  . aspirin EC 81 MG tablet Take 162 mg by mouth daily.     Marland Kitchen atorvastatin (LIPITOR) 40 MG tablet TAKE 1 TABLET(40 MG) BY MOUTH DAILY 90 tablet 2  . azelastine (ASTELIN) 0.1 % nasal spray Place 1 spray into both nostrils daily. Use in each nostril as directed    . chlorthalidone (HYGROTON) 25 MG tablet Take 12.5 mg by mouth daily.    . fluticasone (FLONASE) 50 MCG/ACT nasal spray Place 2 sprays into both nostrils daily.    Marland Kitchen HYDROcodone-acetaminophen (NORCO) 7.5-325 MG tablet Take 1-2 tablets by mouth every 4 (four) hours as needed for moderate pain. 30 tablet 0  . ibuprofen (ADVIL,MOTRIN) 200 MG tablet Take 800 mg by mouth 2 (two) times daily as needed for headache or moderate pain.    Marland Kitchen lisinopril (ZESTRIL) 20 MG tablet Take 1 tablet (20 mg total) by mouth daily. 90 tablet 3  . loratadine (CLARITIN) 10 MG tablet Take 10 mg by mouth daily.    . meloxicam (MOBIC) 15 MG tablet Take 15 mg by mouth daily.     . metoprolol tartrate (LOPRESSOR) 25 MG tablet TAKE 1 TABLET BY MOUTH TWICE DAILY 180 tablet 3  . nitroGLYCERIN (NITROSTAT) 0.4 MG SL tablet Place 1 tablet (0.4 mg total) under the tongue every 5 (five) minutes as needed for chest pain. 25 tablet 3  . pyridOXINE (VITAMIN B-6) 100 MG tablet Take 100 mg by mouth daily.    . tadalafil (CIALIS) 5 MG tablet Take 5 mg by mouth daily as needed.     . vitamin B-12 (CYANOCOBALAMIN) 1000 MCG tablet Take 1,000 mcg by mouth daily.    Marland Kitchen zolpidem (AMBIEN) 5 MG tablet Take 5 mg by mouth at bedtime as needed for sleep.  0   No current facility-administered medications for this visit.     Allergies:   Hydromorphone hcl and Sildenafil citrate   Social  History:  The patient  reports that he quit smoking about 41 years ago. His smoking use included cigarettes. He has a 2.00 pack-year smoking history. He has never used smokeless tobacco. He reports current alcohol use. He reports that he does not use drugs.   Family History:   family history includes Alcohol abuse in his mother; Cancer in his sister; Heart disease in his father; Heart disease (age of onset: 5) in his mother; Hypertension in his father and mother.    Review of Systems: Review of Systems  Constitutional: Negative.   HENT: Negative.   Respiratory: Negative.   Cardiovascular: Negative.        Periodic chest and shoulder burning  Gastrointestinal: Negative.   Musculoskeletal: Positive for joint pain.  Neurological: Negative.   Psychiatric/Behavioral: Negative.   All other systems reviewed and are negative.   PHYSICAL EXAM: VS:  BP 130/74 (  BP Location: Left Arm, Patient Position: Sitting, Cuff Size: Normal)   Pulse (!) 59   Ht 5\' 8"  (1.727 m)   Wt 266 lb (120.7 kg)   BMI 40.45 kg/m  , BMI Body mass index is 40.45 kg/m.  Constitutional:  oriented to person, place, and time. No distress.  HENT:  Head: Grossly normal Eyes:  no discharge. No scleral icterus.  Neck: No JVD, no carotid bruits  Cardiovascular: Regular rate and rhythm, no murmurs appreciated Pulmonary/Chest: Clear to auscultation bilaterally, no wheezes or rails Abdominal: Soft.  no distension.  no tenderness.  Musculoskeletal: Normal range of motion Neurological:  normal muscle tone. Coordination normal. No atrophy Skin: Skin warm and dry Psychiatric: normal affect, pleasant  Recent Labs: No results found for requested labs within last 8760 hours.    Lipid Panel Lab Results  Component Value Date   CHOL 116 07/09/2017   HDL 47 07/09/2017   LDLCALC 41 07/09/2017   TRIG 138 07/09/2017      Wt Readings from Last 3 Encounters:  01/29/20 266 lb (120.7 kg)  01/30/19 259 lb 4 oz (117.6 kg)   12/09/17 272 lb 8 oz (123.6 kg)     ASSESSMENT AND PLAN:  Coronary artery disease with stable angina Prior stent to his distal RCA Nonspecific chest and shoulder pain at the end of the day presenting at rest extending into the evening he reports sometimes several times a week Stress testing offered, he has declined at this time but will call us if symptoms get worse He is thinking it might be more reflux though he is uncertain Recommend he closely monitor symptoms and keep in touch with our office  Hyperlipidemia, unspecified hyperlipidemia type Order placed for lipid labs today  HYPERTENSION, CONTROLLED Blood pressure is well controlled on today's visit. No changes made to the medications. Recommend lifestyle modification, exercise program    Total encounter time more than 25 minutes  Greater than 50% was spent in counseling and coordination of care with the patient   Signed, Esmond Plants, M.D., Ph.D. 01/29/2020  [place], [place]

## 2020-01-29 ENCOUNTER — Ambulatory Visit: Payer: BC Managed Care – PPO | Admitting: Cardiovascular Disease

## 2020-01-29 ENCOUNTER — Other Ambulatory Visit: Payer: Self-pay

## 2020-01-29 ENCOUNTER — Encounter: Payer: Self-pay | Admitting: Cardiovascular Disease

## 2020-01-29 VITALS — BP 130/74 | HR 59 | Ht 68.0 in | Wt 266.0 lb

## 2020-01-29 DIAGNOSIS — I10 Essential (primary) hypertension: Secondary | ICD-10-CM | POA: Diagnosis not present

## 2020-01-29 DIAGNOSIS — E785 Hyperlipidemia, unspecified: Secondary | ICD-10-CM

## 2020-01-29 DIAGNOSIS — I25118 Atherosclerotic heart disease of native coronary artery with other forms of angina pectoris: Secondary | ICD-10-CM | POA: Diagnosis not present

## 2020-01-29 NOTE — Patient Instructions (Addendum)
Medication Instructions:  No changes  If you need a refill on your cardiac medications before your next appointment, please call your pharmacy.    Lab work: Labs today: Lipids and CMP   If you have labs (blood work) drawn today and your tests are completely normal, you will receive your results only by: Marland Kitchen MyChart Message (if you have MyChart) OR . A paper copy in the mail If you have any lab test that is abnormal or we need to change your treatment, we will call you to review the results.   Testing/Procedures: No new testing needed   Follow-Up: At [facility], you and your health needs are our priority.  As part of our continuing mission to provide you with exceptional heart care, we have created designated Provider Care Teams.  These Care Teams include your primary Cardiologist (physician) and Advanced Practice Providers (APPs -  Physician Assistants and Nurse Practitioners) who all work together to provide you with the care you need, when you need it.  . You will need a follow up appointment in 12 months  . Providers on your designated Care Team:   . Murray Hodgkins, NP . Christell Faith, PA-C . Marrianne Mood, PA-C  Any Other Special Instructions Will Be Listed Below (If Applicable).  COVID-19 Vaccine Information can be found at: ShippingScam.co.uk For questions related to vaccine distribution or appointments, please email vaccine@[email or web address] .com or call [phone])[phone].    i

## 2020-01-30 LAB — COMPREHENSIVE METABOLIC PANEL
ALT: 22 IU/L (ref 0–44)
AST: 25 IU/L (ref 0–40)
Albumin/Globulin Ratio: 2.3 — ABNORMAL HIGH (ref 1.2–2.2)
Albumin: 4.4 g/dL (ref 3.8–4.9)
Alkaline Phosphatase: 95 IU/L (ref 44–121)
BUN/Creatinine Ratio: 15 (ref 9–20)
BUN: 14 mg/dL (ref 6–24)
Bilirubin Total: 1 mg/dL (ref 0.0–1.2)
CO2: 28 mmol/L (ref 20–29)
Calcium: 9.6 mg/dL (ref 8.7–10.2)
Chloride: 100 mmol/L (ref 96–106)
Creatinine, Ser: 0.91 mg/dL (ref 0.76–1.27)
GFR calc Af Amer: 106 mL/min/{1.73_m2} (ref >59)
GFR calc non Af Amer: 92 mL/min/{1.73_m2} (ref >59)
Globulin, Total: 1.9 g/dL (ref 1.5–4.5)
Glucose: 142 mg/dL — ABNORMAL HIGH (ref 65–99)
Potassium: 3.7 mmol/L (ref 3.5–5.2)
Sodium: 139 mmol/L (ref 134–144)
Total Protein: 6.3 g/dL (ref 6.0–8.5)

## 2020-01-30 LAB — LIPID PANEL
Chol/HDL Ratio: 2.8 ratio (ref 0.0–5.0)
Cholesterol, Total: 121 mg/dL (ref 100–199)
HDL: 44 mg/dL (ref >39)
LDL Chol Calc (NIH): 56 mg/dL (ref 0–99)
Triglycerides: 118 mg/dL (ref 0–149)
VLDL Cholesterol Cal: 21 mg/dL (ref 5–40)

## 2020-02-05 ENCOUNTER — Other Ambulatory Visit: Payer: Self-pay | Admitting: Cardiovascular Disease

## 2020-02-05 MED ORDER — LISINOPRIL 20 MG PO TABS: 20.0000 mg | ORAL_TABLET | Freq: Every day | ORAL | 3 refills | Status: DC

## 2020-02-05 NOTE — Telephone Encounter (Signed)
*  STAT* If patient is at the pharmacy, call can be transferred to refill team.   1. Which medications need to be refilled? (please list name of each medication and dose if known) lisinopril 20 MG 1 tablet by mouth daily   2. Which pharmacy/location (including street and city if local pharmacy) is medication to be sent to? [facility] in [place]   3. Do they need a 30 day or 90 day supply? 90 day

## 2020-02-05 NOTE — Telephone Encounter (Signed)
Rx request sent to pharmacy.  

## 2020-02-08 ENCOUNTER — Other Ambulatory Visit: Payer: Self-pay | Admitting: Cardiovascular Disease

## 2020-04-10 ENCOUNTER — Other Ambulatory Visit: Payer: Self-pay | Admitting: Cardiovascular Disease

## 2020-11-29 ENCOUNTER — Other Ambulatory Visit: Payer: Self-pay | Admitting: Orthopedic Surgery

## 2020-11-29 DIAGNOSIS — M5441 Lumbago with sciatica, right side: Secondary | ICD-10-CM

## 2020-11-29 DIAGNOSIS — M4316 Spondylolisthesis, lumbar region: Secondary | ICD-10-CM

## 2020-11-29 DIAGNOSIS — M47816 Spondylosis without myelopathy or radiculopathy, lumbar region: Secondary | ICD-10-CM

## 2020-12-06 ENCOUNTER — Other Ambulatory Visit: Payer: Self-pay

## 2020-12-06 ENCOUNTER — Ambulatory Visit
Admission: RE | Admit: 2020-12-06 | Discharge: 2020-12-06 | Disposition: A | Discharge status: Home / Self Care | Payer: BC Managed Care – PPO | Source: Ambulatory Visit | Attending: Orthopedic Surgery | Admitting: Orthopedic Surgery

## 2020-12-06 DIAGNOSIS — M47816 Spondylosis without myelopathy or radiculopathy, lumbar region: Secondary | ICD-10-CM | POA: Insufficient documentation

## 2020-12-06 DIAGNOSIS — M5441 Lumbago with sciatica, right side: Secondary | ICD-10-CM | POA: Diagnosis present

## 2020-12-06 DIAGNOSIS — M4316 Spondylolisthesis, lumbar region: Secondary | ICD-10-CM | POA: Diagnosis present

## 2021-01-09 ENCOUNTER — Other Ambulatory Visit: Payer: Self-pay | Admitting: Cardiovascular Disease

## 2021-01-09 NOTE — Telephone Encounter (Signed)
LMOV  

## 2021-01-09 NOTE — Telephone Encounter (Signed)
Please schedule 12 month F/U appointment. Thank you! 

## 2021-01-28 ENCOUNTER — Other Ambulatory Visit: Payer: Self-pay | Admitting: Cardiovascular Disease

## 2021-01-28 NOTE — Telephone Encounter (Signed)
Please schedule 12 month F/U appointment. Thank you! 

## 2021-03-05 NOTE — Progress Notes (Signed)
Cardiology Office Note  Date:  03/07/2021   ID:  Jacob Marks, DOB [birth year], MRN [id number]  PCP:  Abner Greenspan, MD   Chief Complaint  Patient presents with   12 month follow up     "Doing well." Medications reviewed by the patient verbally.     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 He had significant thrombus. DES was placed to the distal RCA, aspiration thrombectomy with large thrombus revealed. Started on aspirin and brilinta. No other significant disease noted. He presents today for routine follow-up of his coronary artery disease  Last office visit with myself October 2021  Weight up to 270 in June 22 Started keto , weight down , now 230 Pleased with his weight loss, plans to lose more  BP well controlled 248 systolic  Pulse was running low, cut back on metoprolol 12.5 BID No palpitations  Labs: A1C 5.4 in 2021 Total chol 121 in 2021  Still working, does HVAC work through [employer] No exercise  right knee replacement July 2019, redo, [name]   Has OSA, uses CPAP Unable to use the CPAP when he sleeps on his side  EKG personally reviewed by myself on todays visit  shows normal sinus rhythm rate 63 bpm no significant ST or T-wave changes  Other past medical history reviewed Seen in the emergency room 12/05/2017 for palpitations Feels like his heart is skipping a beat , not racing Felt to be having symptomatic PACs Although emergency room note also detail PACs Was having bowel issue  remote history of ABD surgery Colon resection 2013 Followed by Hernia surgery 2013 Surgical resection of kidney  05/08/2016  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   PMH:   has a past medical history of Arthritis, Cancer ([place]), Childhood asthma, Chronic kidney disease, Coronary artery disease, CVA (cerebral infarction) (10/2010), Diverticulosis (7/12), ED (erectile dysfunction), Heart murmur, History of kidney cancer, History of kidney stones, Hyperlipidemia, Hypertension, Kidney stones, OSA (obstructive sleep apnea), Pes planus, STEMI (ST elevation myocardial infarction) ([place]) (07/2012), Stroke ([place]), and Testosterone deficiency.  PSH:    Past Surgical History:  Procedure Laterality Date   ABDOMINAL EXPLORATION SURGERY  05/07/2014   w/LO;&  Repair multiple, incarcerated incisional hernias with mesh   CARDIAC CATHETERIZATION     carotid dopplers  8/12   normal - after cva    COLON SURGERY     due to diverticulosis   CORONARY ANGIOPLASTY WITH STENT PLACEMENT  07/2012   to the distal RCA   EXTRACORPOREAL SHOCK WAVE LITHOTRIPSY  2014   HEMICOLECTOMY  10/2010   for diverticulosis, complic by leaking anastamosis/ abcess/ addn surg and iliostomy    HERNIA REPAIR  05/07/2014   8 repairs   ILEOSTOMY  10/2010   ILEOSTOMY CLOSURE  06/2011   INCISIONAL HERNIA REPAIR N/A 05/07/2014   Procedure: REPAIR RECURRENT INCISIONAL HERNIA;  Surgeon: Fanny Skates, MD;  Location: [place];  Service: General;  Laterality: N/A;   INSERTION OF MESH N/A 05/07/2014   Procedure: INSERTION OF MESH;  Surgeon: Fanny Skates, MD;  Location: [place];  Service: General;  Laterality: N/A;  JOINT REPLACEMENT     right knee   KNEE ARTHROTOMY Right 10/28/2017   Procedure: KNEE OPEN LATERAL RELEASE WITH MEDIAL CAPSULE REEFING;  Surgeon: [name], MD;  Location: [facility];  Service: Orthopedics;  Laterality: Right;   ROBOTIC ASSITED PARTIAL NEPHRECTOMY Right 05/08/2016   Procedure: ROBOTIC ASSITED RETROPERITONEAL PARTIAL NEPHRECTOMY;  Surgeon: Alexis Frock, MD;  Location: [facility];  Service: Urology;   Laterality: Right;   TESTICLE SURGERY     as a child; "they didn't descend"   TONSILLECTOMY AND ADENOIDECTOMY     as a child   TOTAL KNEE ARTHROPLASTY Right 08/17/2017   Procedure: TOTAL KNEE ARTHROPLASTY;  Surgeon: [name], MD;  Location: [facility];  Service: Orthopedics;  Laterality: Right;   VASECTOMY      Current Outpatient Medications  Medication Sig Dispense Refill   aspirin EC 81 MG tablet Take 162 mg by mouth daily.      atorvastatin (LIPITOR) 40 MG tablet Take 1 tablet (40 mg total) by mouth daily. PLEASE CALL TO SCHEDULE APPOINTMENT FOR FURTHER REFILLS. THANK YOU! 30 tablet 0   chlorthalidone (HYGROTON) 25 MG tablet Take 12.5 mg by mouth daily.     ibuprofen (ADVIL,MOTRIN) 200 MG tablet Take 800 mg by mouth 2 (two) times daily as needed for headache or moderate pain.     lisinopril (ZESTRIL) 20 MG tablet TAKE 1 TABLET(20 MG) BY MOUTH DAILY 30 tablet 0   loratadine (CLARITIN) 10 MG tablet Take 10 mg by mouth daily.     metoprolol tartrate (LOPRESSOR) 25 MG tablet TAKE 1 TABLET BY MOUTH TWICE DAILY PLEASE CALL TO SCHEDULE OFFICE VISIT FOR FURTHER REFILLS. THANK YOU! 60 tablet 0   nitroGLYCERIN (NITROSTAT) 0.4 MG SL tablet Place 1 tablet (0.4 mg total) under the tongue every 5 (five) minutes as needed for chest pain. 25 tablet 3   pyridOXINE (VITAMIN B-6) 100 MG tablet Take 100 mg by mouth daily.     tadalafil (CIALIS) 5 MG tablet Take 5 mg by mouth daily as needed.      azelastine (ASTELIN) 0.1 % nasal spray Place 1 spray into both nostrils daily. Use in each nostril as directed (Patient not taking: Reported on 03/07/2021)     fluticasone (FLONASE) 50 MCG/ACT nasal spray Place 2 sprays into both nostrils daily. (Patient not taking: Reported on 03/07/2021)     HYDROcodone-acetaminophen (NORCO) 7.5-325 MG tablet Take 1-2 tablets by mouth every 4 (four) hours as needed for moderate pain. (Patient not taking: Reported on 03/07/2021) 30 tablet 0   meloxicam (MOBIC) 15 MG tablet Take  15 mg by mouth daily.  (Patient not taking: Reported on 03/07/2021)     vitamin B-12 (CYANOCOBALAMIN) 1000 MCG tablet Take 1,000 mcg by mouth daily. (Patient not taking: Reported on 03/07/2021)     zolpidem (AMBIEN) 5 MG tablet Take 5 mg by mouth at bedtime as needed for sleep. (Patient not taking: Reported on 03/07/2021)  0   No current facility-administered medications for this visit.     Allergies:   Hydromorphone hcl and Sildenafil citrate   Social History:  The patient  reports that he quit smoking about 42 years ago. His smoking use included cigarettes. He has a 2.00 pack-year smoking history. He has never used smokeless tobacco. He reports current alcohol use. He reports that he does not use drugs.   Family History:   family history includes Alcohol abuse in his mother; Cancer in his sister; Heart disease in his father; Heart disease (age of  onset: 40) in his mother; Hypertension in his father and mother.    Review of Systems: Review of Systems  Constitutional: Negative.   HENT: Negative.    Respiratory: Negative.    Cardiovascular: Negative.   Gastrointestinal: Negative.   Musculoskeletal:  Positive for joint pain.  Neurological: Negative.   Psychiatric/Behavioral: Negative.    All other systems reviewed and are negative.  PHYSICAL EXAM: VS:  BP 110/60 (BP Location: Left Arm, Patient Position: Sitting, Cuff Size: Normal)   Pulse 63   Ht 5\' 8"  (1.727 m)   Wt 242 lb 2 oz (109.8 kg)   SpO2 98%   BMI 36.81 kg/m  , BMI Body mass index is 36.81 kg/m.  Constitutional:  oriented to person, place, and time. No distress.  HENT:  Head: Grossly normal Eyes:  no discharge. No scleral icterus.  Neck: No JVD, no carotid bruits  Cardiovascular: Regular rate and rhythm, no murmurs appreciated Pulmonary/Chest: Clear to auscultation bilaterally, no wheezes or rails Abdominal: Soft.  no distension.  no tenderness.  Musculoskeletal: Normal range of motion Neurological:  normal muscle  tone. Coordination normal. No atrophy Skin: Skin warm and dry Psychiatric: normal affect, pleasant  Recent Labs: No results found for requested labs within last 8760 hours.    Lipid Panel Lab Results  Component Value Date   CHOL 121 01/29/2020   HDL 44 01/29/2020   LDLCALC 56 01/29/2020   TRIG 118 01/29/2020      Wt Readings from Last 3 Encounters:  03/07/21 242 lb 2 oz (109.8 kg)  01/29/20 266 lb (120.7 kg)  01/30/19 259 lb 4 oz (117.6 kg)     ASSESSMENT AND PLAN:  Coronary artery disease with stable angina Prior stent to his distal RCA Currently with no symptoms of angina. No further workup at this time. Continue current medication regimen.  Hyperlipidemia, unspecified hyperlipidemia type He will have labs done at [place] he call us after these are done so we can evaluate him  HYPERTENSION, CONTROLLED Blood pressure is well controlled on today's visit. No changes made to the medications.  Obesity We have encouraged continued exercise, careful diet management in an effort to lose weight.     Total encounter time more than 25 minutes  Greater than 50% was spent in counseling and coordination of care with the patient   Signed, Esmond Plants, M.D., Ph.D. 03/07/2021  [place], [place]

## 2021-03-07 ENCOUNTER — Ambulatory Visit (INDEPENDENT_AMBULATORY_CARE_PROVIDER_SITE_OTHER): Payer: BC Managed Care – PPO | Admitting: Cardiovascular Disease

## 2021-03-07 ENCOUNTER — Encounter: Payer: Self-pay | Admitting: Cardiovascular Disease

## 2021-03-07 ENCOUNTER — Other Ambulatory Visit: Payer: Self-pay

## 2021-03-07 VITALS — BP 110/60 | HR 63 | Ht 68.0 in | Wt 242.1 lb

## 2021-03-07 DIAGNOSIS — I25118 Atherosclerotic heart disease of native coronary artery with other forms of angina pectoris: Secondary | ICD-10-CM | POA: Diagnosis not present

## 2021-03-07 DIAGNOSIS — E785 Hyperlipidemia, unspecified: Secondary | ICD-10-CM

## 2021-03-07 DIAGNOSIS — I10 Essential (primary) hypertension: Secondary | ICD-10-CM | POA: Diagnosis not present

## 2021-03-07 MED ORDER — ATORVASTATIN CALCIUM 40 MG PO TABS: 40.0000 mg | ORAL_TABLET | Freq: Every day | ORAL | 3 refills | Status: DC

## 2021-03-07 MED ORDER — TADALAFIL 5 MG PO TABS: 5.0000 mg | ORAL_TABLET | Freq: Every day | ORAL | 3 refills | Status: AC | PRN

## 2021-03-07 MED ORDER — CHLORTHALIDONE 25 MG PO TABS: 12.5000 mg | ORAL_TABLET | Freq: Every day | ORAL | 3 refills | Status: DC

## 2021-03-07 MED ORDER — LISINOPRIL 20 MG PO TABS: 20.0000 mg | ORAL_TABLET | Freq: Every day | ORAL | 3 refills | Status: DC

## 2021-03-07 MED ORDER — METOPROLOL TARTRATE 25 MG PO TABS: 12.5000 mg | ORAL_TABLET | Freq: Two times a day (BID) | ORAL | 3 refills | Status: DC

## 2021-03-07 MED ORDER — METOPROLOL TARTRATE 25 MG PO TABS: 25.0000 mg | ORAL_TABLET | Freq: Two times a day (BID) | ORAL | 3 refills | Status: DC

## 2021-03-07 NOTE — Patient Instructions (Addendum)
Medication Instructions:  Please Continue  metoprolol tartrate 12.5 twice a day  If you need a refill on your cardiac medications before your next appointment, please call your pharmacy.   Lab work: No new labs needed  Testing/Procedures: No new testing needed  Follow-Up: At [facility], you and your health needs are our priority.  As part of our continuing mission to provide you with exceptional heart care, we have created designated Provider Care Teams.  These Care Teams include your primary Cardiologist (physician) and Advanced Practice Providers (APPs -  Physician Assistants and Nurse Practitioners) who all work together to provide you with the care you need, when you need it.  You will need a follow up appointment in 12 months  Providers on your designated Care Team:   Murray Hodgkins, NP Christell Faith, PA-C Cadence Kathlen Mody, [place]  COVID-19 Vaccine Information can be found at: [email or web address].uk For questions related to vaccine distribution or appointments, please email [email or web address] or call [phone].

## 2022-03-02 ENCOUNTER — Other Ambulatory Visit: Payer: Self-pay

## 2022-03-02 MED ORDER — ATORVASTATIN CALCIUM 40 MG PO TABS: 40.0000 mg | ORAL_TABLET | Freq: Every day | ORAL | 0 refills | Status: DC

## 2022-03-02 MED ORDER — LISINOPRIL 20 MG PO TABS: 20.0000 mg | ORAL_TABLET | Freq: Every day | ORAL | 0 refills | Status: DC

## 2022-03-02 MED ORDER — METOPROLOL TARTRATE 25 MG PO TABS: 12.5000 mg | ORAL_TABLET | Freq: Two times a day (BID) | ORAL | 0 refills | Status: DC

## 2022-03-07 NOTE — Progress Notes (Unsigned)
Cardiology Office Note  Date:  03/09/2022   ID:  Jacob Marks, DOB [birth year], MRN [id number]  PCP:  Jacob Greenspan, MD   Chief Complaint  Patient presents with   12 month follow up     "Doing well." Medications reviewed by the patient verbally.     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 He had significant thrombus. DES was placed to the distal RCA, aspiration thrombectomy with large thrombus revealed. Started on aspirin and brilinta. No other significant disease noted. 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  Weight remains high, he had a low of 230, trended back up to 260  Blood pressure well controlled No significant palpitations on metoprolol  EKG personally reviewed by myself on todays visit Normal sinus rhythm rate 61 bpm no significant ST-T wave changes  Other past medical history reviewed  right knee replacement July 2019, redo, Jacob Marks   Has OSA, uses CPAP   emergency room 12/05/2017 for palpitations Feels like his heart is skipping a beat , not racing Felt to be having symptomatic PACs Although emergency room note also detail PACs Was having bowel issue  remote history of ABD surgery Colon resection 2013 Followed by Hernia surgery 2013 Surgical resection of kidney  05/08/2016  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   PMH:   has a past medical history of Arthritis, Cancer ([place]), Childhood asthma, Chronic kidney disease, Coronary artery disease, CVA (cerebral infarction) (10/2010), Diverticulosis (7/12), ED (erectile dysfunction), Heart murmur, History of kidney cancer, History of kidney stones, Hyperlipidemia, Hypertension, Kidney stones, OSA (obstructive sleep apnea), Pes planus, STEMI (ST elevation myocardial infarction) ([place]) (07/2012), Stroke ([place]), and Testosterone deficiency.  PSH:    Past Surgical History:  Procedure Laterality Date   ABDOMINAL EXPLORATION SURGERY  05/07/2014   w/LO;&  Repair multiple, incarcerated incisional hernias with mesh   CARDIAC CATHETERIZATION     carotid dopplers  8/12   normal - after cva    COLON SURGERY     due to diverticulosis   CORONARY ANGIOPLASTY WITH STENT PLACEMENT  07/2012   to the distal RCA   EXTRACORPOREAL SHOCK WAVE LITHOTRIPSY  2014   HEMICOLECTOMY  10/2010   for diverticulosis, complic by leaking anastamosis/ abcess/ addn surg and iliostomy    HERNIA REPAIR  05/07/2014   8 repairs   ILEOSTOMY  10/2010   ILEOSTOMY CLOSURE  06/2011   INCISIONAL HERNIA REPAIR N/A 05/07/2014   Procedure: REPAIR RECURRENT INCISIONAL HERNIA;  Surgeon: Jacob Skates, MD;  Location: [place];  Service: General;  Laterality: N/A;   INSERTION OF MESH N/A 05/07/2014   Procedure: INSERTION OF MESH;  Surgeon: Jacob Skates, MD;  Location: [place];  Service: General;  Laterality:  N/A;   JOINT REPLACEMENT     right knee   KNEE ARTHROTOMY Right 10/28/2017   Procedure: KNEE OPEN LATERAL RELEASE WITH MEDIAL CAPSULE REEFING;  Surgeon: Jacob Knows, MD;  Location: [facility];  Service: Orthopedics;  Laterality: Right;   ROBOTIC ASSITED PARTIAL NEPHRECTOMY Right 05/08/2016   Procedure: ROBOTIC ASSITED RETROPERITONEAL PARTIAL NEPHRECTOMY;  Surgeon: Jacob Frock, MD;   [facility];  Service: Urology;  Laterality: Right;   TESTICLE SURGERY     as a child; "they didn't descend"   TONSILLECTOMY AND ADENOIDECTOMY     as a child   TOTAL KNEE ARTHROPLASTY Right 08/17/2017   Procedure: TOTAL KNEE ARTHROPLASTY;  Surgeon: Jacob Knows, MD;  Location: [facility];  Service: Orthopedics;  Laterality: Right;   VASECTOMY      Current Outpatient Medications  Medication Sig Dispense Refill   aspirin EC 81 MG tablet Take 162 mg by mouth daily.      atorvastatin (LIPITOR) 40 MG tablet Take 1 tablet (40 mg total) by mouth daily. 90 tablet 0   chlorthalidone (HYGROTON) 25 MG tablet Take 0.5 tablets (12.5 mg total) by mouth daily. 45 tablet 3   ibuprofen (ADVIL,MOTRIN) 200 MG tablet Take 800 mg by mouth 2 (two) times daily as needed for headache or moderate pain.     lisinopril (ZESTRIL) 20 MG tablet Take 1 tablet (20 mg total) by mouth daily. 90 tablet 0   loratadine (CLARITIN) 10 MG tablet Take 10 mg by mouth daily.     metoprolol tartrate (LOPRESSOR) 25 MG tablet Take 25 mg by mouth 2 (two) times daily.     nitroGLYCERIN (NITROSTAT) 0.4 MG SL tablet Place 1 tablet (0.4 mg total) under the tongue every 5 (five) minutes as needed for chest pain. 25 tablet 3   pyridOXINE (VITAMIN B-6) 100 MG tablet Take 100 mg by mouth daily.     tadalafil (CIALIS) 5 MG tablet Take 1 tablet (5 mg total) by mouth daily as needed. 30 tablet 3   zolpidem (AMBIEN) 5 MG tablet Take 5 mg by mouth at bedtime as needed for sleep.  0   HYDROcodone-acetaminophen (NORCO) 7.5-325 MG tablet Take 1-2 tablets by mouth every 4 (four) hours as needed for moderate pain. (Patient not taking: Reported on 03/07/2021) 30 tablet 0   meloxicam (MOBIC) 15 MG tablet Take 15 mg by mouth daily.  (Patient not taking: Reported on 03/07/2021)     No current facility-administered medications for this visit.     Allergies:   Hydromorphone hcl and Sildenafil citrate   Social History:  The patient  reports that he  quit smoking about 43 years ago. His smoking use included cigarettes. He has a 2.00 pack-year smoking history. He has never used smokeless tobacco. He reports current alcohol use. He reports that he does not use drugs.   Family History:   family history includes Alcohol abuse in his mother; Cancer in his sister; Heart disease in his father; Heart disease (age of onset: 55) in his mother; Hypertension in his father and mother.    Review of Systems: Review of Systems  Constitutional: Negative.   HENT: Negative.    Respiratory: Negative.    Cardiovascular: Negative.   Gastrointestinal: Negative.   Musculoskeletal:  Positive for joint pain.  Neurological: Negative.   Psychiatric/Behavioral: Negative.    All other systems reviewed and are negative.   PHYSICAL EXAM: VS:  BP 128/70 (BP Location: Left Arm, Patient Position: Sitting, Cuff Size: Normal)   Pulse 61  Ht '5\' 8"'$  (1.727 m)   Wt 261 lb 4 oz (118.5 kg)   SpO2 96%   BMI 39.72 kg/m  , BMI Body mass index is 39.72 kg/m.  Constitutional:  oriented to person, place, and time. No distress.  HENT:  Head: Grossly normal Eyes:  no discharge. No scleral icterus.  Neck: No JVD, no carotid bruits  Cardiovascular: Regular rate and rhythm, no murmurs appreciated Pulmonary/Chest: Clear to auscultation bilaterally, no wheezes or rails Abdominal: Soft.  no distension.  no tenderness.  Musculoskeletal: Normal range of motion Neurological:  normal muscle tone. Coordination normal. No atrophy Skin: Skin warm and dry Psychiatric: normal affect, pleasant  Recent Labs: No results found for requested labs within last 365 days.    Lipid Panel Lab Results  Component Value Date   CHOL 121 01/29/2020   HDL 44 01/29/2020   LDLCALC 56 01/29/2020   TRIG 118 01/29/2020    Wt Readings from Last 3 Encounters:  03/09/22 261 lb 4 oz (118.5 kg)  03/07/21 242 lb 2 oz (109.8 kg)  01/29/20 266 lb (120.7 kg)     ASSESSMENT AND PLAN:  Coronary  artery disease with stable angina Prior stent to his distal RCA Currently with no symptoms of angina. No further workup at this time. Continue current medication regimen.  Hyperlipidemia, unspecified hyperlipidemia type Last performed 2 years ago, was at goal We have ordered repeat lipid panel today  HYPERTENSION, CONTROLLED Blood pressure is well controlled on today's visit. No changes made to the medications.  Morbid obesity Weight has trended back up We have encouraged continued exercise, careful diet management in an effort to lose weight.    Total encounter time more than 30 minutes  Greater than 50% was spent in counseling and coordination of care with the patient   Signed, Esmond Plants, M.D., Ph.D. 03/09/2022  [place], [place]

## 2022-03-09 ENCOUNTER — Ambulatory Visit: Payer: BC Managed Care – PPO | Attending: Cardiovascular Disease | Admitting: Cardiovascular Disease

## 2022-03-09 ENCOUNTER — Other Ambulatory Visit
Admission: RE | Admit: 2022-03-09 | Discharge: 2022-03-09 | Disposition: A | Discharge status: Home / Self Care | Payer: BC Managed Care – PPO | Source: Ambulatory Visit | Attending: Cardiovascular Disease | Admitting: Cardiovascular Disease

## 2022-03-09 ENCOUNTER — Encounter: Payer: Self-pay | Admitting: Cardiovascular Disease

## 2022-03-09 VITALS — BP 128/70 | HR 61 | Ht 68.0 in | Wt 261.2 lb

## 2022-03-09 DIAGNOSIS — I10 Essential (primary) hypertension: Secondary | ICD-10-CM

## 2022-03-09 DIAGNOSIS — E785 Hyperlipidemia, unspecified: Secondary | ICD-10-CM | POA: Insufficient documentation

## 2022-03-09 DIAGNOSIS — I25118 Atherosclerotic heart disease of native coronary artery with other forms of angina pectoris: Secondary | ICD-10-CM

## 2022-03-09 LAB — LIPID PANEL
Cholesterol: 116 mg/dL (ref 0–200)
HDL: 44 mg/dL (ref >40)
LDL Cholesterol: 48 mg/dL (ref 0–99)
Total CHOL/HDL Ratio: 2.6 RATIO
Triglycerides: 118 mg/dL (ref <150)
VLDL: 24 mg/dL (ref 0–40)

## 2022-03-09 NOTE — Patient Instructions (Addendum)
Medication Instructions:  No changes  If you need a refill on your cardiac medications before your next appointment, please call your pharmacy.    Lab work: - Your physician recommends that you have lab work today if possible:  Risk analyst at [facility] 1st desk on the right to check in (REGISTRATION)  Lab hours: Monday- Friday (7:30 am- 5:30 pm)   Testing/Procedures: No new testing needed   Follow-Up: At [facility], you and your health needs are our priority.  As part of our continuing mission to provide you with exceptional heart care, we have created designated Provider Care Teams.  These Care Teams include your primary Cardiologist (physician) and Advanced Practice Providers (APPs -  Physician Assistants and Nurse Practitioners) who all work together to provide you with the care you need, when you need it.  You will need a follow up appointment in 12 months  Providers on your designated Care Team:   Murray Hodgkins, NP Christell Faith, PA-C Cadence Kathlen Mody, [place]  COVID-19 Vaccine Information can be found at: [email or web address].uk For questions related to vaccine distribution or appointments, please email [email or web address] or call [phone].

## 2022-06-21 ENCOUNTER — Other Ambulatory Visit: Payer: Self-pay | Admitting: Cardiovascular Disease

## 2022-06-22 ENCOUNTER — Other Ambulatory Visit: Payer: Self-pay | Admitting: Cardiovascular Disease

## 2022-10-01 ENCOUNTER — Other Ambulatory Visit: Payer: Self-pay | Admitting: Cardiovascular Disease

## 2022-10-01 NOTE — Telephone Encounter (Signed)
Request is for Metoprolol 25 mg 1/2 tab bid   last visit 03/09/22:  Blood pressure well controlled.  No significant palpitations on metoprolol--f/u plan to Return in about 1 year   Documented in 03/07/21 office visit:  Pulse was running low, cut back on metoprolol 12.5 BID.  No palpitations

## 2022-12-07 ENCOUNTER — Other Ambulatory Visit: Payer: Self-pay | Admitting: Cardiovascular Disease

## 2022-12-27 ENCOUNTER — Other Ambulatory Visit: Payer: Self-pay | Admitting: Cardiovascular Disease

## 2023-05-24 NOTE — Progress Notes (Deleted)
 Cardiology Office Note  Date:  05/24/2023   ID:  RODNEY YERA, DOB Oct 15, 1960, MRN [id number]  PCP:  Randeen Laine LABOR, MD   No chief complaint on file.   HPI:  Mr. Federici is a pleasant 63 year old gentleman who installs air conditioning units at [employer]  Hx of CAD  presented to the hospital August 05 2012 with STEMI, occluded distal RCA. He had significant thrombus. DES was placed to the distal RCA, aspiration thrombectomy with large thrombus revealed. Started on aspirin  and brilinta . No other significant disease noted. Sleep apnea on CPAP He presents today for routine follow-up of his coronary artery disease  Last office visit with myself Nov 2023    Going to retire 4/24 In general reports he has been feeling well Sees primary care in 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  Weight remains high, he had a low of 230, trended back up to 260  Blood pressure well controlled No significant palpitations on metoprolol   EKG personally reviewed by myself on todays visit Normal sinus rhythm rate 61 bpm no significant ST-T wave changes  Other past medical history reviewed  right knee replacement July 2019, redo, Ozell Flake   Has OSA, uses CPAP   emergency room 12/05/2017 for palpitations Feels like his heart is skipping a beat , not racing Felt to be having symptomatic PACs Although emergency room note also detail PACs Was having bowel issue  remote history of ABD surgery Colon resection 2013 Followed by Hernia surgery 2013 Surgical resection of kidney  05/08/2016  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   PMH:   has a past medical history of Arthritis, Cancer (HCC), Childhood asthma, Chronic kidney disease, Coronary artery disease, CVA (cerebral infarction) (10/2010), Diverticulosis (7/12), ED (erectile dysfunction), Heart murmur, History of kidney cancer, History of kidney stones, Hyperlipidemia, Hypertension, Kidney stones, OSA (obstructive sleep apnea), Pes planus, STEMI (ST elevation myocardial infarction) (HCC) (07/2012), Stroke (HCC), and Testosterone  deficiency.  PSH:    Past Surgical History:  Procedure Laterality Date   ABDOMINAL EXPLORATION SURGERY  05/07/2014   w/LO;&  Repair multiple, incarcerated incisional hernias with mesh   CARDIAC CATHETERIZATION     carotid dopplers  8/12   normal - after cva    COLON SURGERY     due to diverticulosis   CORONARY ANGIOPLASTY WITH STENT PLACEMENT  07/2012   to the distal RCA   EXTRACORPOREAL SHOCK WAVE LITHOTRIPSY  2014   HEMICOLECTOMY  10/2010   for diverticulosis, complic by leaking anastamosis/ abcess/ addn surg and iliostomy    HERNIA REPAIR  05/07/2014   8 repairs   ILEOSTOMY  10/2010   ILEOSTOMY CLOSURE  06/2011   INCISIONAL HERNIA REPAIR N/A 05/07/2014   Procedure: REPAIR RECURRENT INCISIONAL HERNIA;  Surgeon: Elon Pacini, MD;  Location: [facility];  Service: General;  Laterality: N/A;   INSERTION OF MESH N/A 05/07/2014   Procedure: INSERTION OF MESH;  Surgeon: Elon Pacini, MD;  Location: [facility];  Service: General;  Laterality: N/A;   JOINT REPLACEMENT     right knee   KNEE ARTHROTOMY Right 10/28/2017  Procedure: KNEE OPEN LATERAL RELEASE WITH MEDIAL CAPSULE REEFING;  Surgeon: Kathlynn Sharper, MD;  Location: [facility] ORS;  Service: Orthopedics;  Laterality: Right;   ROBOTIC ASSITED PARTIAL NEPHRECTOMY Right 05/08/2016   Procedure: ROBOTIC ASSITED RETROPERITONEAL PARTIAL NEPHRECTOMY;  Surgeon: Ricardo Likens, MD;  Location: [facility];  Service: Urology;  Laterality: Right;   TESTICLE SURGERY     as a child;  they didn't descend   TONSILLECTOMY AND ADENOIDECTOMY     as a child   TOTAL KNEE ARTHROPLASTY Right 08/17/2017   Procedure: TOTAL KNEE ARTHROPLASTY;  Surgeon: Kathlynn Sharper, MD;  Location: [facility] ORS;  Service: Orthopedics;  Laterality: Right;   VASECTOMY      Current Outpatient Medications  Medication Sig Dispense Refill   aspirin  EC 81 MG tablet Take 162 mg by mouth daily.      atorvastatin  (LIPITOR) 40 MG tablet Take 1 tablet (40 mg total) by mouth daily. PLEASE CALL [phone] TO SCHEDULE YEARLY VISIT. THANK YOU. 90 tablet 0   chlorthalidone  (HYGROTON ) 25 MG tablet TAKE 1/2 TABLET(12.5 MG) BY MOUTH DAILY 45 tablet 0   HYDROcodone -acetaminophen  (NORCO) 7.5-325 MG tablet Take 1-2 tablets by mouth every 4 (four) hours as needed for moderate pain. (Patient not taking: Reported on 03/07/2021) 30 tablet 0   ibuprofen (ADVIL,MOTRIN) 200 MG tablet Take 800 mg by mouth 2 (two) times daily as needed for headache or moderate pain.     lisinopril  (ZESTRIL ) 20 MG tablet Take 1 tablet (20 mg total) by mouth daily. PLEASE CALL OFFICE TO SCHEDULE APPOINTMENT PRIOR TO NEXT REFILL 90 tablet 0   loratadine  (CLARITIN ) 10 MG tablet Take 10 mg by mouth daily.     meloxicam (MOBIC) 15 MG tablet Take 15 mg by mouth daily.  (Patient not taking: Reported on 03/07/2021)     metoprolol  tartrate (LOPRESSOR ) 25 MG tablet Take 0.5 tablets (12.5 mg total) by mouth 2 (two) times daily. 90 tablet 0   nitroGLYCERIN  (NITROSTAT ) 0.4 MG SL tablet Place 1 tablet (0.4 mg total) under the tongue every 5 (five) minutes as needed for chest pain. 25 tablet 3   pyridOXINE  (VITAMIN B-6) 100 MG tablet Take 100 mg by mouth daily.     tadalafil  (CIALIS ) 5 MG tablet Take 1 tablet (5 mg total) by mouth daily as needed. 30 tablet 3   zolpidem  (AMBIEN ) 5 MG tablet Take 5 mg by mouth at bedtime as needed for sleep.  0   No current facility-administered medications for this visit.     Allergies:   Hydromorphone  hcl and Sildenafil  citrate   Social History:  The patient  reports that he quit smoking about 45 years ago. His smoking use included cigarettes. He started smoking about 47 years ago. He has a 2 pack-year smoking history. He has never used smokeless tobacco. He reports current alcohol use. He reports that he does not use drugs.   Family History:   family history includes Alcohol abuse in his mother; Cancer in his sister; Heart disease in his father; Heart disease (age of onset: 29) in his mother; Hypertension in his father and mother.    Review of Systems: Review of Systems  Constitutional: Negative.   HENT: Negative.    Respiratory: Negative.    Cardiovascular: Negative.   Gastrointestinal: Negative.   Musculoskeletal:  Positive for joint pain.  Neurological: Negative.   Psychiatric/Behavioral: Negative.    All other systems reviewed and are negative.   PHYSICAL EXAM: VS:  There were no vitals taken for this  visit. , BMI There is no height or weight on file to calculate BMI.  Constitutional:  oriented to person, place, and time. No distress.  HENT:  Head: Grossly normal Eyes:  no discharge. No scleral icterus.  Neck: No JVD, no carotid bruits  Cardiovascular: Regular rate and rhythm, no murmurs appreciated Pulmonary/Chest: Clear to auscultation bilaterally, no wheezes or rails Abdominal: Soft.  no distension.  no tenderness.  Musculoskeletal: Normal range of motion Neurological:  normal muscle tone. Coordination normal. No atrophy Skin: Skin warm and dry Psychiatric: normal affect, pleasant  Recent Labs: No results found for requested labs within last 365 days.    Lipid Panel Lab Results  Component Value Date   CHOL 116 03/09/2022   HDL 44 03/09/2022   LDLCALC 48 03/09/2022   TRIG 118 03/09/2022    Wt Readings from Last 3 Encounters:  03/09/22 261 lb 4 oz (118.5 kg)  03/07/21 242 lb 2 oz (109.8 kg)  01/29/20 266 lb (120.7 kg)     ASSESSMENT AND PLAN:  Coronary artery disease  with stable angina Prior stent to his distal RCA Currently with no symptoms of angina. No further workup at this time. Continue current medication regimen.  Hyperlipidemia, unspecified hyperlipidemia type Last performed 2 years ago, was at goal We have ordered repeat lipid panel today  HYPERTENSION, CONTROLLED Blood pressure is well controlled on today's visit. No changes made to the medications.  Morbid obesity Weight has trended back up We have encouraged continued exercise, careful diet management in an effort to lose weight.    Total encounter time more than 30 minutes  Greater than 50% was spent in counseling and coordination of care with the patient   Signed, Velinda Lunger, M.D., Ph.D. 05/24/2023  [facility] [place], [place] [phone]

## 2023-05-25 ENCOUNTER — Ambulatory Visit: Payer: BC Managed Care – PPO | Admitting: Cardiovascular Disease

## 2023-05-25 DIAGNOSIS — I10 Essential (primary) hypertension: Secondary | ICD-10-CM

## 2023-05-25 DIAGNOSIS — I213 ST elevation (STEMI) myocardial infarction of unspecified site: Secondary | ICD-10-CM

## 2023-05-25 DIAGNOSIS — I25118 Atherosclerotic heart disease of native coronary artery with other forms of angina pectoris: Secondary | ICD-10-CM

## 2023-05-25 DIAGNOSIS — E785 Hyperlipidemia, unspecified: Secondary | ICD-10-CM

## 2023-05-25 DIAGNOSIS — G4730 Sleep apnea, unspecified: Secondary | ICD-10-CM

## 2023-08-01 NOTE — Progress Notes (Deleted)
 Cardiology Office Note  Date:  08/01/2023   ID:  Jacob Marks, DOB 1960/09/13, MRN [id number]  PCP:  Clemens Curt, MD   No chief complaint on file.   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 He had significant thrombus. DES was placed to the distal RCA, aspiration thrombectomy with large thrombus revealed. Started on aspirin and brilinta. No other significant disease noted. 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  Weight remains high, he had a low of 230, trended back up to 260  Blood pressure well controlled No significant palpitations on metoprolol  EKG personally reviewed by myself on todays visit Normal sinus rhythm rate 61 bpm no significant ST-T wave changes  Other past medical history reviewed  right knee replacement July 2019, redo, [name]   Has OSA, uses CPAP   emergency room 12/05/2017 for palpitations Feels like his heart is skipping a beat , not racing Felt to be having symptomatic PACs Although emergency room note also detail PACs Was having bowel issue  remote history of ABD surgery Colon resection 2013 Followed by Hernia surgery 2013 Surgical resection of kidney  05/08/2016  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   PMH:   has a past medical history of Arthritis, Cancer (HCC), Childhood asthma, Chronic kidney disease, Coronary artery disease, CVA (cerebral infarction) (10/2010), Diverticulosis (7/12), ED (erectile dysfunction), Heart murmur, History of kidney cancer, History of kidney stones, Hyperlipidemia, Hypertension, Kidney stones, OSA (obstructive sleep apnea), Pes planus, STEMI (ST elevation myocardial infarction) (HCC) (07/2012), Stroke (HCC), and Testosterone deficiency.  PSH:    Past Surgical History:  Procedure Laterality Date   ABDOMINAL EXPLORATION SURGERY  05/07/2014   w/LO;&  Repair multiple, incarcerated incisional hernias with mesh   CARDIAC CATHETERIZATION     carotid dopplers  8/12   normal - after cva    COLON SURGERY     due to diverticulosis   CORONARY ANGIOPLASTY WITH STENT PLACEMENT  07/2012   to the distal RCA   EXTRACORPOREAL SHOCK WAVE LITHOTRIPSY  2014   HEMICOLECTOMY  10/2010   for diverticulosis, complic by leaking anastamosis/ abcess/ addn surg and iliostomy    HERNIA REPAIR  05/07/2014   8 repairs   ILEOSTOMY  10/2010   ILEOSTOMY CLOSURE  06/2011   INCISIONAL HERNIA REPAIR N/A 05/07/2014   Procedure: REPAIR RECURRENT INCISIONAL HERNIA;  Surgeon: Boyce Byes, MD;  Location: [facility];  Service: General;  Laterality: N/A;   INSERTION OF MESH N/A 05/07/2014   Procedure: INSERTION OF MESH;  Surgeon: Boyce Byes, MD;  Location: [facility];  Service: General;  Laterality: N/A;   JOINT REPLACEMENT     right knee   KNEE ARTHROTOMY Right 10/28/2017   Procedure: KNEE  OPEN LATERAL RELEASE WITH MEDIAL CAPSULE REEFING;  Surgeon: [name], MD;  Location: [facility] ORS;  Service: Orthopedics;  Laterality: Right;   ROBOTIC ASSITED PARTIAL NEPHRECTOMY Right 05/08/2016   Procedure: ROBOTIC ASSITED RETROPERITONEAL PARTIAL NEPHRECTOMY;  Surgeon: Osborn Blaze, MD;  Location: [facility];  Service: Urology;  Laterality: Right;   TESTICLE SURGERY     as a child;  "they didn't descend"   TONSILLECTOMY AND ADENOIDECTOMY     as a child   TOTAL KNEE ARTHROPLASTY Right 08/17/2017   Procedure: TOTAL KNEE ARTHROPLASTY;  Surgeon: [name], MD;  Location: [facility] ORS;  Service: Orthopedics;  Laterality: Right;   VASECTOMY      Current Outpatient Medications  Medication Sig Dispense Refill   aspirin EC 81 MG tablet Take 162 mg by mouth daily.      atorvastatin (LIPITOR) 40 MG tablet Take 1 tablet (40 mg total) by mouth daily. PLEASE CALL [phone] TO SCHEDULE YEARLY VISIT. THANK YOU. 90 tablet 0   chlorthalidone (HYGROTON) 25 MG tablet TAKE 1/2 TABLET(12.5 MG) BY MOUTH DAILY 45 tablet 0   HYDROcodone-acetaminophen (NORCO) 7.5-325 MG tablet Take 1-2 tablets by mouth every 4 (four) hours as needed for moderate pain. (Patient not taking: Reported on 03/07/2021) 30 tablet 0   ibuprofen (ADVIL,MOTRIN) 200 MG tablet Take 800 mg by mouth 2 (two) times daily as needed for headache or moderate pain.     lisinopril (ZESTRIL) 20 MG tablet Take 1 tablet (20 mg total) by mouth daily. PLEASE CALL OFFICE TO SCHEDULE APPOINTMENT PRIOR TO NEXT REFILL 90 tablet 0   loratadine (CLARITIN) 10 MG tablet Take 10 mg by mouth daily.     meloxicam (MOBIC) 15 MG tablet Take 15 mg by mouth daily.  (Patient not taking: Reported on 03/07/2021)     metoprolol tartrate (LOPRESSOR) 25 MG tablet Take 0.5 tablets (12.5 mg total) by mouth 2 (two) times daily. 90 tablet 0   nitroGLYCERIN (NITROSTAT) 0.4 MG SL tablet Place 1 tablet (0.4 mg total) under the tongue every 5 (five) minutes as needed for chest pain. 25 tablet 3   pyridOXINE (VITAMIN B-6) 100 MG tablet Take 100 mg by mouth daily.     tadalafil (CIALIS) 5 MG tablet Take 1 tablet (5 mg total) by mouth daily as needed. 30 tablet 3   zolpidem (AMBIEN) 5 MG tablet Take 5 mg by mouth at bedtime as needed for sleep.  0   No current facility-administered medications for this visit.     Allergies:   Hydromorphone hcl and Sildenafil  citrate   Social History:  The patient  reports that he quit smoking about 45 years ago. His smoking use included cigarettes. He started smoking about 47 years ago. He has a 2 pack-year smoking history. He has never used smokeless tobacco. He reports current alcohol use. He reports that he does not use drugs.   Family History:   family history includes Alcohol abuse in his mother; Cancer in his sister; Heart disease in his father; Heart disease (age of onset: 70) in his mother; Hypertension in his father and mother.    Review of Systems: Review of Systems  Constitutional: Negative.   HENT: Negative.    Respiratory: Negative.    Cardiovascular: Negative.   Gastrointestinal: Negative.   Musculoskeletal:  Positive for joint pain.  Neurological: Negative.   Psychiatric/Behavioral: Negative.    All other systems reviewed and are negative.   PHYSICAL EXAM: VS:  There were no vitals taken for this visit. ,  BMI There is no height or weight on file to calculate BMI.  Constitutional:  oriented to person, place, and time. No distress.  HENT:  Head: Grossly normal Eyes:  no discharge. No scleral icterus.  Neck: No JVD, no carotid bruits  Cardiovascular: Regular rate and rhythm, no murmurs appreciated Pulmonary/Chest: Clear to auscultation bilaterally, no wheezes or rails Abdominal: Soft.  no distension.  no tenderness.  Musculoskeletal: Normal range of motion Neurological:  normal muscle tone. Coordination normal. No atrophy Skin: Skin warm and dry Psychiatric: normal affect, pleasant  Recent Labs: No results found for requested labs within last 365 days.    Lipid Panel Lab Results  Component Value Date   CHOL 116 03/09/2022   HDL 44 03/09/2022   LDLCALC 48 03/09/2022   TRIG 118 03/09/2022    Wt Readings from Last 3 Encounters:  03/09/22 261 lb 4 oz (118.5 kg)  03/07/21 242 lb 2 oz (109.8 kg)  01/29/20 266 lb (120.7 kg)     ASSESSMENT AND PLAN:  Coronary artery disease  with stable angina Prior stent to his distal RCA Currently with no symptoms of angina. No further workup at this time. Continue current medication regimen.  Hyperlipidemia, unspecified hyperlipidemia type Last performed 2 years ago, was at goal We have ordered repeat lipid panel today  HYPERTENSION, CONTROLLED Blood pressure is well controlled on today's visit. No changes made to the medications.  Morbid obesity Weight has trended back up We have encouraged continued exercise, careful diet management in an effort to lose weight.    Total encounter time more than 30 minutes  Greater than 50% was spent in counseling and coordination of care with the patient   Signed, Juanda Noon, M.D., Ph.D. 08/01/2023  [facility] [place], [place] [phone]

## 2023-08-02 ENCOUNTER — Ambulatory Visit: Payer: 59 | Admitting: Cardiovascular Disease

## 2023-08-02 DIAGNOSIS — E785 Hyperlipidemia, unspecified: Secondary | ICD-10-CM

## 2023-08-02 DIAGNOSIS — I10 Essential (primary) hypertension: Secondary | ICD-10-CM

## 2023-08-02 DIAGNOSIS — I25118 Atherosclerotic heart disease of native coronary artery with other forms of angina pectoris: Secondary | ICD-10-CM

## 2023-08-02 DIAGNOSIS — G4730 Sleep apnea, unspecified: Secondary | ICD-10-CM

## 2023-08-04 NOTE — Progress Notes (Unsigned)
 Cardiology Office Note    Date:  08/05/2023   ID:  JAYDENN BOCCIO, DOB 04-13-1961, MRN [id number]  PCP:  Warren Lacy, MD  Cardiologist:  Julien Nordmann, MD  Electrophysiologist:  None   Chief Complaint: Follow-up  History of Present Illness:   Jacob Marks is a 63 y.o. male with history of CAD with inferior ST elevation MI in 07/2012 status post PCI/DES to the distal RCA, HTN, HLD, diverticulitis status post partial colectomy with ileostomy and revision with postoperative CVA in 2016, RCC status post nephrectomy in 2018, nephrolithiasis, GERD, obesity, and OSA on CPAP who presents for follow up of CAD.  He was admitted to the hospital in 07/2012 with an inferior ST elevation MI.  LHC with aspiration thrombectomy which large thrombus revealed.  Minor luminal irregularities involving the left coronary tree.  He underwent successful PCI/DES.  EF 60% by LV gram.  Echo showed an EF of 55-60%, diastolic function, normal RV systolic function, ventricular cavity size, and RVSP.  He has not required ischemic evaluation since.  He was last seen in the office in 02/2022 and was without symptoms of angina or cardiac decompensation.  He comes in today continuing to do very well from a cardiac perspective.  No symptoms of angina or cardiac decompensation.  No dyspnea, palpitations, dizziness, presyncope, or syncope.  No falls or symptoms concerning for bleeding.  Has been working around the house preparing for his mother-in-law to move in from [place].  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  With these activities he has been without symptoms of cardiac limitation.  Main limiting factor at this time is low back pain.  Adherent and tolerating cardiac medications without off target effect.  Overall feels well from a cardiac perspective and does not have any acute concerns at this time.   Labs independently reviewed: 01/2023 - A1c 6.7, TC 112, TG 188, HDL 40, LDL 34, TSH  normal, potassium 3.5, BUN 17, serum creatinine 0.95, albumin 4.2, AST/ALT normal 05/2021 - Hgb 15.6, PLT 152  Past Medical History:  Diagnosis Date   Arthritis    osteoarthritis of right knee   Cancer (HCC)    kidney   Childhood asthma    Chronic kidney disease    renal cyst right   Coronary artery disease    CVA (cerebral infarction) 10/2010   after his hemicolectomy; "some paralysis right side of mouth and slight speech problem as a result" (05/07/2014)   Diverticulosis 7/12   with hemicolectomy-- complications    ED (erectile dysfunction)    Heart murmur    as a child   History of kidney cancer    lesion on the right kidney   History of kidney stones    Hyperlipidemia    Hypertension    Kidney stones    OSA (obstructive sleep apnea)    "has CPAP; doesn't wear it" (05/07/2014)   Pes planus    STEMI (ST elevation myocardial infarction) (HCC) 07/2012   Stroke (HCC)    slight right sided weakness - in face    Testosterone deficiency     Past Surgical History:  Procedure Laterality Date   ABDOMINAL EXPLORATION SURGERY  05/07/2014   w/LO;&  Repair multiple, incarcerated incisional hernias with mesh   CARDIAC CATHETERIZATION     carotid dopplers  8/12   normal - after cva    COLON SURGERY     due to diverticulosis   CORONARY ANGIOPLASTY  WITH STENT PLACEMENT  07/2012   to the distal RCA   EXTRACORPOREAL SHOCK WAVE LITHOTRIPSY  2014   HEMICOLECTOMY  10/2010   for diverticulosis, complic by leaking anastamosis/ abcess/ addn surg and iliostomy    HERNIA REPAIR  05/07/2014   8 repairs   ILEOSTOMY  10/2010   ILEOSTOMY CLOSURE  06/2011   INCISIONAL HERNIA REPAIR N/A 05/07/2014   Procedure: REPAIR RECURRENT INCISIONAL HERNIA;  Surgeon: Boyce Byes, MD;  Location: [facility] OR;  Service: General;  Laterality: N/A;   INSERTION OF MESH N/A 05/07/2014   Procedure: INSERTION OF MESH;  Surgeon: Boyce Byes, MD;  Location: [facility] OR;  Service: General;  Laterality: N/A;   JOINT REPLACEMENT      right knee   KNEE ARTHROTOMY Right 10/28/2017   Procedure: KNEE OPEN LATERAL RELEASE WITH MEDIAL CAPSULE REEFING;  Surgeon: [name], MD;  Location: [facility] ORS;  Service: Orthopedics;  Laterality: Right;   ROBOTIC ASSITED PARTIAL NEPHRECTOMY Right 05/08/2016   Procedure: ROBOTIC ASSITED RETROPERITONEAL PARTIAL NEPHRECTOMY;  Surgeon: Osborn Blaze, MD;  Location: [facility] ORS;  Service: Urology;  Laterality: Right;   TESTICLE SURGERY     as a child; "they didn't descend"   TONSILLECTOMY AND ADENOIDECTOMY     as a child   TOTAL KNEE ARTHROPLASTY Right 08/17/2017   Procedure: TOTAL KNEE ARTHROPLASTY;  Surgeon: [name], MD;  Location: [facility] ORS;  Service: Orthopedics;  Laterality: Right;   VASECTOMY      Current Medications: Current Meds  Medication Sig   aspirin EC 81 MG tablet Take 162 mg by mouth daily.    atorvastatin (LIPITOR) 40 MG tablet Take 1 tablet (40 mg total) by mouth daily. PLEASE CALL [phone] TO SCHEDULE YEARLY VISIT. THANK YOU.   hydrochlorothiazide (HYDRODIURIL) 25 MG tablet Take 25 mg by mouth daily.   HYDROcodone-acetaminophen (NORCO) 7.5-325 MG tablet Take 1-2 tablets by mouth every 4 (four) hours as needed for moderate pain.   ibuprofen (ADVIL,MOTRIN) 200 MG tablet Take 800 mg by mouth 2 (two) times daily as needed for headache or moderate pain.   lisinopril (ZESTRIL) 20 MG tablet Take 1 tablet (20 mg total) by mouth daily. PLEASE CALL OFFICE TO SCHEDULE APPOINTMENT PRIOR TO NEXT REFILL   loratadine (CLARITIN) 10 MG tablet Take 10 mg by mouth daily.   meloxicam (MOBIC) 15 MG tablet Take 15 mg by mouth daily.   metoprolol tartrate (LOPRESSOR) 25 MG tablet Take 0.5 tablets (12.5 mg total) by mouth 2 (two) times daily.   MOUNJARO 7.5 MG/0.5ML Pen    nitroGLYCERIN (NITROSTAT) 0.4 MG SL tablet Place 1 tablet (0.4 mg total) under the tongue every 5 (five) minutes as needed for chest pain.   pyridOXINE (VITAMIN B-6) 100 MG tablet Take 100 mg by mouth daily.   tadalafil  (CIALIS) 5 MG tablet Take 1 tablet (5 mg total) by mouth daily as needed.   zolpidem (AMBIEN) 5 MG tablet Take 5 mg by mouth at bedtime as needed for sleep.    Allergies:   Hydromorphone hcl and Sildenafil citrate   Social History   Socioeconomic History   Marital status: Married    Spouse name: Not on file   Number of children: Not on file   Years of education: Not on file   Highest education level: Not on file  Occupational History   Not on file  Tobacco Use   Smoking status: Former    Current packs/day: 0.00    Average packs/day: 1 pack/day for 2.0 years (2.0  ttl pk-yrs)    Types: Cigarettes    Start date: 04/20/1976    Quit date: 04/20/1978    Years since quitting: 45.3   Smokeless tobacco: Never  Vaping Use   Vaping status: Never Used  Substance and Sexual Activity   Alcohol use: Yes    Alcohol/week: 0.0 standard drinks of alcohol    Comment: occasional   Drug use: No   Sexual activity: Yes  Other Topics Concern   Not on file  Social History Narrative   Not on file   Social Drivers of Health   Financial Resource Strain: Low Risk  (02/03/2023)   [facility]   Overall Financial Resource Strain (CARDIA)    Difficulty of Paying Living Expenses: Not hard at all  Food Insecurity: No Food Insecurity (02/03/2023)   Received from [facility]   Hunger Vital Sign    Worried About Running Out of Food in the Last Year: Never true    Ran Out of Food in the Last Year: Never true  Transportation Needs: No Transportation Needs (02/03/2023)   [facility]    Lack of Transportation (Medical): No    Lack of Transportation (Non-Medical): No  Physical Activity: Not on file  Stress: Not on file  Social Connections: Not on file     Family History:  The patient's family history includes Alcohol abuse in his mother; Cancer in his sister; Heart disease in his father; Heart disease (age of onset: 71) in his mother;  Hypertension in his father and mother.  ROS:   12-point review of systems is negative unless otherwise noted in the HPI.   EKGs/Labs/Other Studies Reviewed:    Studies reviewed were summarized above. The additional studies were reviewed today: As above.   EKG:  EKG is ordered today.  The EKG ordered today demonstrates NSR 63 bpm, no acute ST-T changes  Recent Labs: No results found for requested labs within last 365 days.  Recent Lipid Panel    Component Value Date/Time   CHOL 116 03/09/2022 1227   CHOL 121 01/29/2020 1044   CHOL 134 08/07/2012 0343   TRIG 118 03/09/2022 1227   TRIG 130 08/07/2012 0343   HDL 44 03/09/2022 1227   HDL 44 01/29/2020 1044   HDL 35 (L) 08/07/2012 0343   CHOLHDL 2.6 03/09/2022 1227   VLDL 24 03/09/2022 1227   VLDL 26 08/07/2012 0343   LDLCALC 48 03/09/2022 1227   LDLCALC 56 01/29/2020 1044   LDLCALC 73 08/07/2012 0343   LDLDIRECT 133.9 01/15/2010 1535    PHYSICAL EXAM:    VS:  BP 134/76   Pulse 62   Ht 5\' 8"  (1.727 m)   Wt 262 lb 6.4 oz (119 kg)   SpO2 96%   BMI 39.90 kg/m   BMI: Body mass index is 39.9 kg/m.  Physical Exam Vitals reviewed.  Constitutional:      Appearance: He is well-developed.  HENT:     Head: Normocephalic and atraumatic.  Eyes:     General:        Right eye: No discharge.        Left eye: No discharge.  Cardiovascular:     Rate and Rhythm: Normal rate and regular rhythm.     Heart sounds: Normal heart sounds, S1 normal and S2 normal. Heart sounds not distant. No midsystolic click and no opening snap. No murmur heard.    No friction rub.  Pulmonary:     Effort: Pulmonary effort is normal. No respiratory distress.     Breath sounds: Normal breath sounds. No decreased breath sounds, wheezing, rhonchi or rales.  Chest:     Chest wall: No tenderness.  Musculoskeletal:     Cervical back: Normal range of motion.     Right lower leg: No edema.     Left lower leg: No edema.  Skin:    General: Skin is warm and  dry.     Nails: There is no clubbing.  Neurological:     Mental Status: He is alert and oriented to person, place, and time.  Psychiatric:        Speech: Speech normal.        Behavior: Behavior normal.        Thought Content: Thought content normal.        Judgment: Judgment normal.     Wt Readings from Last 3 Encounters:  08/05/23 262 lb 6.4 oz (119 kg)  03/09/22 261 lb 4 oz (118.5 kg)  03/07/21 242 lb 2 oz (109.8 kg)     ASSESSMENT & PLAN:   CAD involving the native coronary arteries without angina: He continues to do very well and is without symptoms of angina or cardiac decompensation.  Continue aggressive risk factor modification and second prevention including aspirin 81 mg, atorvastatin 40 mg, and Lopressor 12.5 mg twice daily.  No indication for further ischemic testing at this time.  HTN: Blood pressure is well-controlled in the office today.  He remains on chlorthalidone 12.5 mg, lisinopril 20 mg and Lopressor 12.5 mg twice daily.  Labs obtained through PCP's office.  HLD: LDL 34 in 01/2023.  He remains on atorvastatin 40 mg.  Labs through PCP's office.    Disposition: F/u with Dr. Gollan or an APP in 12 months.   Medication Adjustments/Labs and Tests Ordered: Current medicines are reviewed at length with the patient today.  Concerns regarding medicines are outlined above. Medication changes, Labs and Tests ordered today are summarized above and listed in the Patient Instructions accessible in Encounters.   Signed, Varney Gentleman, PA-C 08/05/2023 12:51 PM     [facility] [place] [address] [place], [place] [address] [phone]

## 2023-08-05 ENCOUNTER — Ambulatory Visit: Attending: Physician Assistant | Admitting: Physician Assistant

## 2023-08-05 ENCOUNTER — Encounter: Payer: Self-pay | Admitting: Physician Assistant

## 2023-08-05 VITALS — BP 134/76 | HR 62 | Ht 68.0 in | Wt 262.4 lb

## 2023-08-05 DIAGNOSIS — E785 Hyperlipidemia, unspecified: Secondary | ICD-10-CM

## 2023-08-05 DIAGNOSIS — I10 Essential (primary) hypertension: Secondary | ICD-10-CM

## 2023-08-05 DIAGNOSIS — I2510 Atherosclerotic heart disease of native coronary artery without angina pectoris: Secondary | ICD-10-CM

## 2023-08-05 NOTE — Patient Instructions (Signed)
 Medication Instructions:  Your Physician recommend you continue on your current medication as directed.    *If you need a refill on your cardiac medications before your next appointment, please call your pharmacy*  Lab Work: None ordered at this time   Follow-Up: At [facility], you and your health needs are our priority.  As part of our continuing mission to provide you with exceptional heart care, our providers are all part of one team.  This team includes your primary Cardiologist (physician) and Advanced Practice Providers or APPs (Physician Assistants and Nurse Practitioners) who all work together to provide you with the care you need, when you need it.  Your next appointment:   12 month(s)  Provider:   You may see Timothy Gollan, MD or Varney Gentleman, PA-C

## 2023-08-10 ENCOUNTER — Ambulatory Visit: Admitting: Cardiovascular Disease

## 2023-10-12 ENCOUNTER — Telehealth: Payer: Self-pay | Admitting: Cardiovascular Disease

## 2023-10-12 NOTE — Telephone Encounter (Signed)
   Name: Jacob Marks  DOB: Feb 15, 1961  MRN: [id number]  Primary Cardiologist: Evalene Lunger, MD   Preoperative team, please contact this patient and set up a phone call appointment for further preoperative risk assessment. Please obtain consent and complete medication review. Thank you for your help.  I confirm that guidance regarding antiplatelet and oral anticoagulation therapy has been completed and, if necessary, noted below.  None requested.  I also confirmed the patient resides in the state of [place] . As per [facility] Medical Board telemedicine laws, the patient must reside in the state in which the provider is licensed.   [name], NP 10/12/2023, 2:06 PM [facility]

## 2023-10-12 NOTE — Telephone Encounter (Signed)
   Pre-operative Risk Assessment    Patient Name: Jacob Marks  DOB: [birth year] MRN: [id number]   Date of last office visit: 08/05/23 Date of next office visit: n/a   Request for Surgical Clearance    Procedure:  left knee total replacement  Date of Surgery:  Clearance TBD                                Surgeon:  Dr. Lonni Ditty Surgeon's Group or Practice Name:  [facility] Phone number:  [phone] Fax number:  [phone]   Type of Clearance Requested:   - Medical    Type of Anesthesia:  Not Indicated   Additional requests/questions:    Bonney Othel VEAR Lavelle   10/12/2023, 1:52 PM

## 2023-10-12 NOTE — Telephone Encounter (Signed)
 Left message to call back to schedule tele pre op appt.

## 2023-10-13 NOTE — Telephone Encounter (Signed)
 LVM asking pt to call our office to schedule virtual visit.

## 2023-10-14 NOTE — Telephone Encounter (Signed)
 3rd attempt to reach pt to schedule a tele preop appt. I will send update the requesting office pt will need to call our office to schedule a tele preop appt.   In the meantime will remove from the preop call back pool .

## 2023-10-21 NOTE — Telephone Encounter (Signed)
 Patient was returning call. Please advise ?

## 2023-10-26 NOTE — Telephone Encounter (Signed)
 Left message to call back to schedule tele pre op appt.

## 2023-10-28 NOTE — Telephone Encounter (Signed)
 Patient is returning call.

## 2023-10-28 NOTE — Telephone Encounter (Signed)
 2nd attempt: Left message for pt to call our office and ask for the preop team to get TELE Preop app scheduled.

## 2023-11-01 ENCOUNTER — Telehealth: Payer: Self-pay | Admitting: *Deleted

## 2023-11-01 NOTE — Telephone Encounter (Signed)
 S/w the pt's wife who states the pt is with her right now. We were able to schedule a tele preop appt 11/03/23. Med rec and consent are done.     Patient Consent for Virtual Visit        [name] has provided verbal consent on 11/01/2023 for a virtual visit (video or telephone).   CONSENT FOR VIRTUAL VISIT FOR:  Jacob Marks  By participating in this virtual visit I agree to the following:  I hereby voluntarily request, consent and authorize [facility] and its employed or contracted physicians, physician assistants, nurse practitioners or other licensed health care professionals (the Practitioner), to provide me with telemedicine health care services (the "Services) as deemed necessary by the treating Practitioner. I acknowledge and consent to receive the Services by the Practitioner via telemedicine. I understand that the telemedicine visit will involve communicating with the Practitioner through live audiovisual communication technology and the disclosure of certain medical information by electronic transmission. I acknowledge that I have been given the opportunity to request an in-person assessment or other available alternative prior to the telemedicine visit and am voluntarily participating in the telemedicine visit.  I understand that I have the right to withhold or withdraw my consent to the use of telemedicine in the course of my care at any time, without affecting my right to future care or treatment, and that the Practitioner or I may terminate the telemedicine visit at any time. I understand that I have the right to inspect all information obtained and/or recorded in the course of the telemedicine visit and may receive copies of available information for a reasonable fee.  I understand that some of the potential risks of receiving the Services via telemedicine include:  Delay or interruption in medical evaluation due to technological equipment failure or  disruption; Information transmitted may not be sufficient (e.g. poor resolution of images) to allow for appropriate medical decision making by the Practitioner; and/or  In rare instances, security protocols could fail, causing a breach of personal health information.  Furthermore, I acknowledge that it is my responsibility to provide information about my medical history, conditions and care that is complete and accurate to the best of my ability. I acknowledge that Practitioner's advice, recommendations, and/or decision may be based on factors not within their control, such as incomplete or inaccurate data provided by me or distortions of diagnostic images or specimens that may result from electronic transmissions. I understand that the practice of medicine is not an exact science and that Practitioner makes no warranties or guarantees regarding treatment outcomes. I acknowledge that a copy of this consent can be made available to me via my patient portal [facility] MyChart), or I can request a printed copy by calling the office of [facility].    I understand that my insurance will be billed for this visit.   I have read or had this consent read to me. I understand the contents of this consent, which adequately explains the benefits and risks of the Services being provided via telemedicine.  I have been provided ample opportunity to ask questions regarding this consent and the Services and have had my questions answered to my satisfaction. I give my informed consent for the services to be provided through the use of telemedicine in my medical care

## 2023-11-01 NOTE — Telephone Encounter (Signed)
 S/w the pt's wife who states the pt is with her right now. We were able to schedule a tele preop appt 11/03/23. Med rec and consent are done.

## 2023-11-01 NOTE — Telephone Encounter (Signed)
 Surgeon needs ASA recommendations as well.

## 2023-11-03 ENCOUNTER — Ambulatory Visit: Attending: Cardiology | Admitting: Emergency Medicine

## 2023-11-03 DIAGNOSIS — Z01810 Encounter for preprocedural cardiovascular examination: Secondary | ICD-10-CM | POA: Diagnosis not present

## 2023-11-03 NOTE — Progress Notes (Signed)
 Virtual Visit via Telephone Note   Because of Jacob Marks co-morbid illnesses, he is at least at moderate risk for complications without adequate follow up.  This format is felt to be most appropriate for this patient at this time.  Due to technical limitations with video connection Web designer), today's appointment will be conducted as an audio only telehealth visit, and Jacob Marks verbally agreed to proceed in this manner.   All issues noted in this document were discussed and addressed.  No physical exam could be performed with this format.  Evaluation Performed:  Preoperative cardiovascular risk assessment _____________   Date:  11/03/2023   Patient ID:  Jacob Marks, DOB February 17, 1961, MRN [id number] Patient Location:  Home Provider location:   Office  Primary Care Provider:  Collie Nelwyn Agent, MD Primary Cardiologist:  Evalene Lunger, MD  Chief Complaint / Patient Profile   63 y.o. y/o male with a h/o coronary artery disease with inferior ST elevation MI in 07/2012 s/p PCI/DES to the distal RCA, hypertension, hyperlipidemia, diverticulitis status post partial colectomy with ileostomy and revision with postoperative CVA in 2016, RCC status post nephrectomy in 2018, nephrolithiasis, GERD, obesity, OSA on CPAP who is pending left total knee replacement with [facility] orth[facility] on date TBD and presents today for telephonic preoperative cardiovascular risk assessment.  History of Present Illness    Jacob Marks is a 63 y.o. male who presents via audio/video conferencing for a telehealth visit today.  Pt was last seen in cardiology clinic on 08/05/2023 by Bernardino Bring, PA.  At that time Jacob Marks was doing well.  The patient is now pending procedure as outlined above. Since his last visit, he denies chest pain, shortness of breath, lower extremity edema, fatigue, palpitations, melena, hematuria, hemoptysis, diaphoresis, weakness, presyncope, syncope, orthopnea, and PND.  Today  patient is doing well overall as he remains entirely asymptomatic.  He is without any acute cardiovascular concerns or complaints today.  He denies any exertional symptoms, dyspnea, or chest pains.  He does stay fairly active doing various house chores and activities as well as yard work such as mowing and working in the garden without exertional symptoms.  Overall he is able to complete greater than 4 METS. Past Medical History    Past Medical History:  Diagnosis Date   Arthritis    osteoarthritis of right knee   Cancer (HCC)    kidney   Childhood asthma    Chronic kidney disease    renal cyst right   Coronary artery disease    CVA (cerebral infarction) 10/2010   after his hemicolectomy; some paralysis right side of mouth and slight speech problem as a result (05/07/2014)   Diverticulosis 7/12   with hemicolectomy-- complications    ED (erectile dysfunction)    Heart murmur    as a child   History of kidney cancer    lesion on the right kidney   History of kidney stones    Hyperlipidemia    Hypertension    Kidney stones    OSA (obstructive sleep apnea)    has CPAP; doesn't wear it (05/07/2014)   Pes planus    STEMI (ST elevation myocardial infarction) (HCC) 07/2012   Stroke (HCC)    slight right sided weakness - in face    Testosterone  deficiency    Past Surgical History:  Procedure Laterality Date   ABDOMINAL EXPLORATION SURGERY  05/07/2014   w/LO;&  Repair multiple, incarcerated incisional hernias with mesh  CARDIAC CATHETERIZATION     carotid dopplers  8/12   normal - after cva    COLON SURGERY     due to diverticulosis   CORONARY ANGIOPLASTY WITH STENT PLACEMENT  07/2012   to the distal RCA   EXTRACORPOREAL SHOCK WAVE LITHOTRIPSY  2014   HEMICOLECTOMY  10/2010   for diverticulosis, complic by leaking anastamosis/ abcess/ addn surg and iliostomy    HERNIA REPAIR  05/07/2014   8 repairs   ILEOSTOMY  10/2010   ILEOSTOMY CLOSURE  06/2011   INCISIONAL HERNIA REPAIR N/A  05/07/2014   Procedure: REPAIR RECURRENT INCISIONAL HERNIA;  Surgeon: Elon Pacini, MD;  Location: [facility];  Service: General;  Laterality: N/A;   INSERTION OF MESH N/A 05/07/2014   Procedure: INSERTION OF MESH;  Surgeon: Elon Pacini, MD;  Location: [facility];  Service: General;  Laterality: N/A;   JOINT REPLACEMENT     right knee   KNEE ARTHROTOMY Right 10/28/2017   Procedure: KNEE OPEN LATERAL RELEASE WITH MEDIAL CAPSULE REEFING;  Surgeon: Kathlynn Sharper, MD;  Location: [facility] ORS;  Service: Orthopedics;  Laterality: Right;   ROBOTIC ASSITED PARTIAL NEPHRECTOMY Right 05/08/2016   Procedure: ROBOTIC ASSITED RETROPERITONEAL PARTIAL NEPHRECTOMY;  Surgeon: Ricardo Likens, MD;  Location: [facility];  Service: Urology;  Laterality: Right;   TESTICLE SURGERY     as a child; they didn't descend   TONSILLECTOMY AND ADENOIDECTOMY     as a child   TOTAL KNEE ARTHROPLASTY Right 08/17/2017   Procedure: TOTAL KNEE ARTHROPLASTY;  Surgeon: Kathlynn Sharper, MD;  Location: [facility] ORS;  Service: Orthopedics;  Laterality: Right;   VASECTOMY      Allergies  Allergies  Allergen Reactions   Hydromorphone  Hcl Other (See Comments)    Passed out   Sildenafil Citrate Other (See Comments) and Palpitations    Patient is unfamiliar with this medication Viagra specifically. Tolerates generic sildenafil    Home Medications    Prior to Admission medications   Medication Sig Start Date End Date Taking? Authorizing Provider  aspirin  EC 81 MG tablet Take 162 mg by mouth daily.     [provider]  atorvastatin  (LIPITOR) 40 MG tablet Take 1 tablet (40 mg total) by mouth daily. PLEASE CALL [phone] TO SCHEDULE YEARLY VISIT. THANK YOU. 12/08/22   Gollan, Timothy J, MD  hydrochlorothiazide (HYDRODIURIL) 25 MG tablet Take 25 mg by mouth daily.    [provider]  HYDROcodone -acetaminophen  (NORCO) 7.5-325 MG tablet Take 1-2 tablets by mouth every 4 (four) hours as needed for moderate pain. 10/29/17   Charlene Debby BROCKS, PA-C  ibuprofen (ADVIL,MOTRIN) 200 MG tablet Take 800 mg by mouth 2 (two) times daily as needed for headache or moderate pain.    [provider]  lisinopril  (ZESTRIL ) 20 MG tablet Take 1 tablet (20 mg total) by mouth daily. PLEASE CALL OFFICE TO SCHEDULE APPOINTMENT PRIOR TO NEXT REFILL 12/28/22   Gollan, Timothy J, MD  loratadine  (CLARITIN ) 10 MG tablet Take 10 mg by mouth daily.    [provider]  meloxicam (MOBIC) 15 MG tablet Take 15 mg by mouth daily. 01/24/19   [provider]  metoprolol  tartrate (LOPRESSOR ) 25 MG tablet Take 0.5 tablets (12.5 mg total) by mouth 2 (two) times daily. 10/01/22   Gollan, Timothy J, MD  MOUNJARO 7.5 MG/0.5ML Pen  06/29/23   [provider]  nitroGLYCERIN  (NITROSTAT ) 0.4 MG SL tablet Place 1 tablet (0.4 mg total) under the tongue every 5 (five) minutes as needed  for chest pain. 04/30/15   Gollan, Timothy J, MD  pyridOXINE  (VITAMIN B-6) 100 MG tablet Take 100 mg by mouth daily.    [provider]  tadalafil  (CIALIS ) 5 MG tablet Take 1 tablet (5 mg total) by mouth daily as needed. 03/07/21   Gollan, Timothy J, MD  zolpidem  (AMBIEN ) 5 MG tablet Take 5 mg by mouth at bedtime as needed for sleep. 09/15/17   [provider]    Physical Exam    Vital Signs:  Jacob Marks does not have vital signs available for review today.  Given telephonic nature of communication, physical exam is limited. AAOx3. NAD. Normal affect.  Speech and respirations are unlabored.  Accessory Clinical Findings    None  Assessment & Plan    1.  Preoperative Cardiovascular Risk Assessment: According to the Revised Cardiac Risk Index (RCRI), his Perioperative Risk of Major Cardiac Event is (%): 6.6. His Functional Capacity in METs is: 5.62 according to the Duke Activity Status Index (DASI). Therefore, based on ACC/AHA guidelines, patient would be at acceptable risk for the planned procedure without further cardiovascular testing.  I will route this recommendation to the requesting party via Epic fax function.  The patient was advised that if he develops new symptoms prior to surgery to contact our office to arrange for a follow-up visit, and he verbalized understanding.  Ideally aspirin  should be continued without interruption, however if the bleeding risk is too great, aspirin  may be held for 5-7 days prior to surgery. Please resume aspirin  post operatively when it is felt to be safe from a bleeding standpoint.    A copy of this note will be routed to requesting surgeon.  Time:   Today, I have spent 6 minutes with the patient with telehealth technology discussing medical history, symptoms, and management plan.     Jacob LITTIE Louis, NP  11/03/2023, 2:49 PM

## 2024-03-13 ENCOUNTER — Telehealth (HOSPITAL_BASED_OUTPATIENT_CLINIC_OR_DEPARTMENT_OTHER): Payer: Self-pay | Admitting: *Deleted

## 2024-03-13 ENCOUNTER — Telehealth (HOSPITAL_BASED_OUTPATIENT_CLINIC_OR_DEPARTMENT_OTHER): Payer: Self-pay

## 2024-03-13 NOTE — Telephone Encounter (Signed)
 Pt has been scheduled tele preop appt 03/22/24. Procedure will not be scheduled until he has been cleared per the pt. Med rec and consent are done

## 2024-03-13 NOTE — Telephone Encounter (Signed)
   Name: Jacob Marks  DOB: Apr 26, 1960  MRN: [id number]  Primary Cardiologist: Evalene Lunger, MD   Preoperative team, please contact this patient and set up a phone call appointment for further preoperative risk assessment. Please obtain consent and complete medication review. Thank you for your help.  I confirm that guidance regarding antiplatelet and oral anticoagulation therapy has been completed and, if necessary, noted below.  Ideally aspirin  should be continued without interruption, however if the bleeding risk is too great, aspirin  may be held for 5-7 days prior to surgery. Please resume aspirin  post operatively when it is felt to be safe from a bleeding standpoint.    I also confirmed the patient resides in the state of [place] . As per [facility] [employer] Board telemedicine laws, the patient must reside in the state in which the provider is licensed.   Lum LITTIE Louis, NP 03/13/2024, 12:26 PM [facility]

## 2024-03-13 NOTE — Telephone Encounter (Signed)
   Pre-operative Risk Assessment    Patient Name: Jacob Marks  DOB: 1961-03-08 MRN: [id number]   Date of last office visit: Teleheath/PreOp with Lum Louis, NP and In-Person OV 08/05/2023 with Bernardino Bring, PA-C Date of next office visit: None  Request for Surgical Clearance    Procedure:  Left TKA  Date of Surgery:  Clearance TBD                                 Surgeon:  Dr. Lorelle Socks Group or Practice Name:  [facility] Phone number:  [phone] Fax number:  [phone]   Type of Clearance Requested:   - Medical  - Pharmacy:  Hold Aspirin  -Does not specify   Type of Anesthesia:  Does not specify    Additional requests/questions:    SignedPatrcia Iverson CROME   03/13/2024, 11:54 AM

## 2024-03-22 ENCOUNTER — Ambulatory Visit: Attending: Cardiology | Admitting: Nurse Practitioner

## 2024-03-22 DIAGNOSIS — Z01810 Encounter for preprocedural cardiovascular examination: Secondary | ICD-10-CM | POA: Diagnosis not present

## 2024-03-22 NOTE — Progress Notes (Signed)
 Virtual Visit via Telephone Note   Because of Jacob Marks co-morbid illnesses, he is at least at moderate risk for complications without adequate follow up.  This format is felt to be most appropriate for this patient at this time.  Due to technical limitations with video connection web designer), today's appointment will be conducted as an audio only telehealth visit, and Jacob Marks verbally agreed to proceed in this manner.   All issues noted in this document were discussed and addressed.  No physical exam could be performed with this format.  Evaluation Performed:  Preoperative cardiovascular risk assessment _____________   Date:  03/22/2024   Patient ID:  Jacob Marks, DOB 01-Jan-1961, MRN [id number] Patient Location:  Home Provider location:   Office  Primary Care Provider:  Collie Nelwyn Agent, MD Primary Cardiologist:  Jacob Lunger, MD  Chief Complaint / Patient Profile   63 y.o. y/o male with a h/o inferior ST elevation MI in 07/2012 s/p PCI/DES to the distal RCA, hypertension, hyperlipidemia, diverticulitis status post partial colectomy with ileostomy and revision with postoperative CVA in 2016, RCC status post nephrectomy in 2018, nephrolithiasis, GERD, obesity, OSA on CPAP who is pending left total knee arthroplasty with Dr. Conda of [facility] and [facility] and presents today for telephonic preoperative cardiovascular risk assessment.  History of Present Illness    Jacob Marks is a 63 y.o. male who presents via audio/video conferencing for a telehealth visit today.  Pt was last seen in cardiology clinic on 08/05/2023 by Jacob Bring, PA. He was seen virtually on 11/03/2023 by Jacob Louis, NP at that time Jacob Marks was doing well. The patient is now pending procedure as outlined above. Since his last visit, he has done well from a cardiac standpoint.   He denies chest pain, palpitations, dyspnea, pnd, orthopnea, n, v, dizziness, syncope,  edema, weight gain, or early satiety. All other systems reviewed and are otherwise negative except as noted above.   Past Medical History    Past Medical History:  Diagnosis Date   Arthritis    osteoarthritis of right knee   Cancer (HCC)    kidney   Childhood asthma    Chronic kidney disease    renal cyst right   Coronary artery disease    CVA (cerebral infarction) 10/2010   after his hemicolectomy; some paralysis right side of mouth and slight speech problem as a result (05/07/2014)   Diverticulosis 7/12   with hemicolectomy-- complications    ED (erectile dysfunction)    Heart murmur    as a child   History of kidney cancer    lesion on the right kidney   History of kidney stones    Hyperlipidemia    Hypertension    Kidney stones    OSA (obstructive sleep apnea)    has CPAP; doesn't wear it (05/07/2014)   Pes planus    STEMI (ST elevation myocardial infarction) (HCC) 07/2012   Stroke (HCC)    slight right sided weakness - in face    Testosterone  deficiency    Past Surgical History:  Procedure Laterality Date   ABDOMINAL EXPLORATION SURGERY  05/07/2014   w/LO;&  Repair multiple, incarcerated incisional hernias with mesh   CARDIAC CATHETERIZATION     carotid dopplers  8/12   normal - after cva    COLON SURGERY     due to diverticulosis   CORONARY ANGIOPLASTY WITH STENT PLACEMENT  07/2012   to the distal RCA  EXTRACORPOREAL SHOCK WAVE LITHOTRIPSY  2014   HEMICOLECTOMY  10/2010   for diverticulosis, complic by leaking anastamosis/ abcess/ addn surg and iliostomy    HERNIA REPAIR  05/07/2014   8 repairs   ILEOSTOMY  10/2010   ILEOSTOMY CLOSURE  06/2011   INCISIONAL HERNIA REPAIR N/A 05/07/2014   Procedure: REPAIR RECURRENT INCISIONAL HERNIA;  Surgeon: Jacob Pacini, MD;  Location: [facility];  Service: General;  Laterality: N/A;   INSERTION OF MESH N/A 05/07/2014   Procedure: INSERTION OF MESH;  Surgeon: Jacob Pacini, MD;  Location: [facility];  Service: General;  Laterality:  N/A;   JOINT REPLACEMENT     right knee   KNEE ARTHROTOMY Right 10/28/2017   Procedure: KNEE OPEN LATERAL RELEASE WITH MEDIAL CAPSULE REEFING;  Surgeon: Jacob Sharper, MD;  Location: [facility] ORS;  Service: Orthopedics;  Laterality: Right;   ROBOTIC ASSITED PARTIAL NEPHRECTOMY Right 05/08/2016   Procedure: ROBOTIC ASSITED RETROPERITONEAL PARTIAL NEPHRECTOMY;  Surgeon: Jacob Likens, MD;  Location: [facility];  Service: Urology;  Laterality: Right;   TESTICLE SURGERY     as a child; they didn't descend   TONSILLECTOMY AND ADENOIDECTOMY     as a child   TOTAL KNEE ARTHROPLASTY Right 08/17/2017   Procedure: TOTAL KNEE ARTHROPLASTY;  Surgeon: Jacob Sharper, MD;  Location: [facility] ORS;  Service: Orthopedics;  Laterality: Right;   VASECTOMY      Allergies  Allergies  Allergen Reactions   Hydromorphone  Hcl Other (See Comments)    Passed out   Sildenafil Citrate Other (See Comments) and Palpitations    Patient is unfamiliar with this medication Viagra specifically. Tolerates generic sildenafil    Home Medications    Prior to Admission medications   Medication Sig Start Date End Date Taking? Authorizing Provider  aspirin  EC 81 MG tablet Take 162 mg by mouth daily.     [provider]  atorvastatin  (LIPITOR) 40 MG tablet Take 1 tablet (40 mg total) by mouth daily. PLEASE CALL [phone] TO SCHEDULE YEARLY VISIT. THANK YOU. 12/08/22   Jacob Marks, Jacob J, MD  hydrochlorothiazide (HYDRODIURIL) 25 MG tablet Take 25 mg by mouth daily.    [provider]  HYDROcodone -acetaminophen  (NORCO) 7.5-325 MG tablet Take 1-2 tablets by mouth every 4 (four) hours as needed for moderate pain. 10/29/17   Jacob Debby BROCKS, PA-C  ibuprofen (ADVIL,MOTRIN) 200 MG tablet Take 800 mg by mouth 2 (two) times daily as needed for headache or moderate pain.    [provider]  lisinopril  (ZESTRIL ) 20 MG tablet Take 1 tablet (20 mg total) by mouth daily. PLEASE CALL OFFICE TO SCHEDULE APPOINTMENT PRIOR TO  NEXT REFILL 12/28/22   Jacob Marks, Jacob J, MD  loratadine  (CLARITIN ) 10 MG tablet Take 10 mg by mouth daily.    [provider]  meloxicam (MOBIC) 15 MG tablet Take 15 mg by mouth daily. 01/24/19   [provider]  metoprolol  tartrate (LOPRESSOR ) 25 MG tablet Take 0.5 tablets (12.5 mg total) by mouth 2 (two) times daily. 10/01/22   Jacob Marks, Jacob J, MD  MOUNJARO 7.5 MG/0.5ML Pen  06/29/23   [provider]  nitroGLYCERIN  (NITROSTAT ) 0.4 MG SL tablet Place 1 tablet (0.4 mg total) under the tongue every 5 (five) minutes as needed for chest pain. 04/30/15   Jacob Marks, Jacob J, MD  pyridOXINE  (VITAMIN B-6) 100 MG tablet Take 100 mg by mouth daily.    [provider]  tadalafil  (CIALIS ) 5 MG tablet Take 1 tablet (5 mg total) by mouth daily as  needed. 03/07/21   Jacob Marks, Jacob J, MD  zolpidem  (AMBIEN ) 5 MG tablet Take 5 mg by mouth at bedtime as needed for sleep. 09/15/17   [provider]    Physical Exam    Vital Signs:  Jacob Marks does not have vital signs available for review today.  Given telephonic nature of communication, physical exam is limited. AAOx3. NAD. Normal affect.  Speech and respirations are unlabored.  Accessory Clinical Findings    None  Assessment & Plan    1.  Preoperative Cardiovascular Risk Assessment:  According to the Revised Cardiac Risk Index (RCRI), his Perioperative Risk of Major Cardiac Event is (%): 6.6. His Functional Capacity in METs is: 7.99 according to the Duke Activity Status Index (DASI).Therefore, based on ACC/AHA guidelines, patient would be at acceptable risk for the planned procedure without further cardiovascular testing.   The patient was advised that if he develops new symptoms prior to surgery to contact our office to arrange for a follow-up visit, and he verbalized understanding.  Regarding ASA therapy, we recommend continuation of ASA throughout the perioperative period. However, if the surgeon feels  that cessation of ASA is required in the perioperative period, it may be stopped 5-7 days prior to surgery with a plan to resume it as soon as felt to be feasible from a surgical standpoint in the post-operative period.  A copy of this note will be routed to requesting surgeon.  Time:   Today, I have spent 5 minutes with the patient with telehealth technology discussing medical history, symptoms, and management plan.     Jacob JAYSON Braver, NP  03/22/2024, 11:31 AM

## 2024-05-08 ENCOUNTER — Other Ambulatory Visit: Payer: Self-pay | Admitting: Orthopedic Surgery

## 2024-05-11 ENCOUNTER — Telehealth: Payer: Self-pay | Admitting: Cardiovascular Disease

## 2024-05-11 NOTE — Telephone Encounter (Signed)
"  ° °  Pre-operative Risk Assessment    Patient Name: Jacob Marks  DOB: 02-02-1961 MRN: [id number]   Date of last office visit: 08/05/23 Date of next office visit: TBD   Request for Surgical Clearance    Procedure:  TKA  Date of Surgery:  Clearance 05/22/24                                Surgeon:  Lorelle Socks Group or Practice Name:  [facility] Phone number:  [phone] Fax number:  [phone]   Type of Clearance Requested:   - Medical    Type of Anesthesia:  Not Indicated   Additional requests/questions:  Please advise surgeon/provider what medications should be held.  Bonney Audrene LOISE Agapito   05/11/2024, 2:53 PM   "

## 2024-05-12 ENCOUNTER — Encounter
Admission: RE | Admit: 2024-05-12 | Discharge: 2024-05-12 | Disposition: A | Source: Ambulatory Visit | Attending: Orthopedic Surgery

## 2024-05-12 ENCOUNTER — Other Ambulatory Visit: Payer: Self-pay

## 2024-05-12 VITALS — Ht 68.0 in | Wt 262.0 lb

## 2024-05-12 DIAGNOSIS — M1710 Unilateral primary osteoarthritis, unspecified knee: Secondary | ICD-10-CM

## 2024-05-12 DIAGNOSIS — E119 Type 2 diabetes mellitus without complications: Secondary | ICD-10-CM

## 2024-05-12 DIAGNOSIS — Z01812 Encounter for preprocedural laboratory examination: Secondary | ICD-10-CM

## 2024-05-12 DIAGNOSIS — Z01818 Encounter for other preprocedural examination: Secondary | ICD-10-CM

## 2024-05-12 HISTORY — DX: Unilateral primary osteoarthritis, left knee: M17.12

## 2024-05-12 HISTORY — DX: Depression, unspecified: F32.A

## 2024-05-12 HISTORY — DX: Malignant neoplasm of unspecified kidney, except renal pelvis: C64.9

## 2024-05-12 HISTORY — DX: Gastro-esophageal reflux disease without esophagitis: K21.9

## 2024-05-12 HISTORY — DX: Benign paroxysmal vertigo, unspecified ear: H81.10

## 2024-05-12 HISTORY — DX: Anemia, unspecified: D64.9

## 2024-05-12 HISTORY — DX: Presence of coronary angioplasty implant and graft: Z95.5

## 2024-05-12 HISTORY — DX: Type 2 diabetes mellitus without complications: E11.9

## 2024-05-12 NOTE — Patient Instructions (Addendum)
 Your procedure is scheduled on:05-22-24 Monday Report to the Registration Desk on the 1st floor of the [facility].Then proceed to the 2nd floor Surgery Desk To find out your arrival time, please call [phone] between 1PM - 3PM on:05-19-24 Friday If your arrival time is 6:00 am, do not arrive before that time as the [facility] entrance doors do not open until 6:00 am.  REMEMBER: Instructions that are not followed completely may result in serious medical risk, up to and including death; or upon the discretion of your surgeon and anesthesiologist your surgery may need to be rescheduled.  Do not eat food after midnight the night before surgery.  No gum chewing or hard candies.  You may however, drink CLEAR liquids up to 2 hours before you are scheduled to arrive for your surgery. Do not drink anything within 2 hours of your scheduled arrival time.  Clear liquids include: - water   - apple juice without pulp - gatorade (not RED colors) - black coffee or tea (Do NOT add milk or creamers to the coffee or tea) Do NOT drink anything that is not on this list.  In addition, your doctor has ordered for you to drink the provided:  Ensure Pre-Surgery Clear Carbohydrate Drink  Drinking this carbohydrate drink up to two hours before surgery helps to reduce insulin  resistance and improve patient outcomes. Please complete drinking 2 hours before scheduled arrival time.  One week prior to surgery:Last dose will be on 05-14-24 Sunday Stop Anti-inflammatories (NSAIDS) such as Advil, Aleve , Ibuprofen, Motrin, Naproxen , Naprosyn  and Aspirin  based products such as Excedrin, Goody's Powder, BC Powder. Stop ANY OVER THE COUNTER supplements until after surgery (Multivitamin)  You may however, continue to take Tylenol  if needed for pain up until the day of surgery  Stop tadalafil  (CIALIS ) 2 days prior to surgery-Last dose will be on 05-19-24 Friday  Stop tirzepatide [facility]) 7 days prior to surgery-Do NOT  take again until AFTER surgery  Continue taking all of your other prescription medications up until the day of surgery.  ON THE DAY OF SURGERY ONLY TAKE THESE MEDICATIONS WITH SIPS OF WATER : -metoprolol  tartrate (LOPRESSOR )   Last dose of 162 mg Aspirin  will be on 05-14-24 Sunday. Start 81 mg Aspirin  on 05-15-24 Monday and Continue 81 mg Aspirin  up until the day prior to surgery-DO NOT take the day of surgery  No Alcohol for 24 hours before or after surgery.  No Smoking including e-cigarettes for 24 hours before surgery.  No chewable tobacco products for at least 6 hours before surgery.  No nicotine patches on the day of surgery.  Do not use any recreational drugs for at least a week (preferably 2 weeks) before your surgery.  Please be advised that the combination of cocaine and anesthesia may have negative outcomes, up to and including death. If you test positive for cocaine, your surgery will be cancelled.  On the morning of surgery brush your teeth with toothpaste and water , you may rinse your mouth with mouthwash if you wish. Do not swallow any toothpaste or mouthwash.  Use CHG Soap as directed on instruction sheet.  Do not wear jewelry, make-up, hairpins, clips or nail polish.  For welded (permanent) jewelry: bracelets, anklets, waist bands, etc.  Please have this removed prior to surgery.  If it is not removed, there is a chance that hospital personnel will need to cut it off on the day of surgery.  Do not wear lotions, powders, or perfumes.   Do not  shave body hair from the neck down 48 hours before surgery.  Contact lenses, hearing aids and dentures may not be worn into surgery.  Do not bring valuables to the hospital. [facility] is not responsible for any missing/lost belongings or valuables.   Notify your doctor if there is any change in your medical condition (cold, fever, infection).  Wear comfortable clothing (specific to your surgery type) to the  hospital.  After surgery, you can help prevent lung complications by doing breathing exercises.  Take deep breaths and cough every 1-2 hours. Your doctor may order a device called an Incentive Spirometer to help you take deep breaths. When coughing or sneezing, hold a pillow firmly against your incision with both hands. This is called splinting. Doing this helps protect your incision. It also decreases belly discomfort.  If you are being admitted to the hospital overnight, leave your suitcase in the car. After surgery it may be brought to your room.  In case of increased patient census, it may be necessary for you, the patient, to continue your postoperative care in the Same Day Surgery department.  If you are being discharged the day of surgery, you will not be allowed to drive home. You will need a responsible individual to drive you home and stay with you for 24 hours after surgery.   If you are taking public transportation, you will need to have a responsible individual with you.  Please call the Pre-admissions Testing Dept. at [phone] if you have any questions about these instructions.  Surgery Visitation Policy:  Patients having surgery or a procedure may have two visitors.  Children under the age of 9 must have an adult with them who is not the patient.  Inpatient Visitation:    Visiting hours are 7 a.m. to 8 p.m. Up to four visitors are allowed at one time in a patient room. The visitors may rotate out with other people during the day.  One visitor age 67 or older may stay with the patient overnight and must be in the room by 8 p.m.     Pre-operative 4 CHG Bath Instructions   You can play a key role in reducing the risk of infection after surgery. Your skin needs to be as free of germs as possible. You can reduce the number of germs on your skin by washing with CHG (chlorhexidine  gluconate) soap before surgery. CHG is an antiseptic soap that kills germs and  continues to kill germs even after washing.   DO NOT use if you have an allergy to chlorhexidine /CHG or antibacterial soaps. If your skin becomes reddened or irritated, stop using the CHG and notify one of our RNs at [phone].   Please shower with the CHG soap starting 4 days before surgery using the following schedule:     Please keep in mind the following:  DO NOT shave, including legs and underarms, starting the day of your first shower.   You may shave your face at any point before/day of surgery.  Place clean sheets on your bed the day you start using CHG soap. Use a clean washcloth (not used since being washed) for each shower. DO NOT sleep with pets once you start using the CHG.   CHG Shower Instructions:  If you choose to wash your hair and private area, wash first with your normal shampoo/soap.  After you use shampoo/soap, rinse your hair and body thoroughly to remove shampoo/soap residue.  Turn the water  OFF and apply about  3 tablespoons (45 ml) of CHG soap to a CLEAN washcloth.  Apply CHG soap ONLY FROM YOUR NECK DOWN TO YOUR TOES (washing for 3-5 minutes)  DO NOT use CHG soap on face, private areas, open wounds, or sores.  Pay special attention to the area where your surgery is being performed.  If you are having back surgery, having someone wash your back for you may be helpful. Wait 2 minutes after CHG soap is applied, then you may rinse off the CHG soap.  Pat dry with a clean towel  Put on clean clothes/pajamas   If you choose to wear lotion, please use ONLY the CHG-compatible lotions on the back of this paper.     Additional instructions for the day of surgery: DO NOT APPLY any lotions, deodorants, cologne, or perfumes.   Put on clean/comfortable clothes.  Brush your teeth.  Ask your nurse before applying any prescription medications to the skin.      CHG Compatible Lotions   Aveeno Moisturizing lotion  Cetaphil Moisturizing Cream  Cetaphil Moisturizing  Lotion  Clairol Herbal Essence Moisturizing Lotion, Dry Skin  Clairol Herbal Essence Moisturizing Lotion, Extra Dry Skin  Clairol Herbal Essence Moisturizing Lotion, Normal Skin  Curel Age Defying Therapeutic Moisturizing Lotion with Alpha Hydroxy  Curel Extreme Care Body Lotion  Curel Soothing Hands Moisturizing Hand Lotion  Curel Therapeutic Moisturizing Cream, Fragrance-Free  Curel Therapeutic Moisturizing Lotion, Fragrance-Free  Curel Therapeutic Moisturizing Lotion, Original Formula  Eucerin Daily Replenishing Lotion  Eucerin Dry Skin Therapy Plus Alpha Hydroxy Crme  Eucerin Dry Skin Therapy Plus Alpha Hydroxy Lotion  Eucerin Original Crme  Eucerin Original Lotion  Eucerin Plus Crme Eucerin Plus Lotion  Eucerin TriLipid Replenishing Lotion  Keri Anti-Bacterial Hand Lotion  Keri Deep Conditioning Original Lotion Dry Skin Formula Softly Scented  Keri Deep Conditioning Original Lotion, Fragrance Free Sensitive Skin Formula  Keri Lotion Fast Absorbing Fragrance Free Sensitive Skin Formula  Keri Lotion Fast Absorbing Softly Scented Dry Skin Formula  Keri Original Lotion  Keri Skin Renewal Lotion Keri Silky Smooth Lotion  Keri Silky Smooth Sensitive Skin Lotion  Nivea Body Creamy Conditioning Oil  Nivea Body Extra Enriched Lotion  Nivea Body Original Lotion  Nivea Body Sheer Moisturizing Lotion Nivea Crme  Nivea Skin Firming Lotion  NutraDerm 30 Skin Lotion  NutraDerm Skin Lotion  NutraDerm Therapeutic Skin Cream  NutraDerm Therapeutic Skin Lotion  ProShield Protective Hand Cream  Provon moisturizing lotion  How to Use an Incentive Spirometer An incentive spirometer is a tool that measures how well you are filling your lungs with each breath. Learning to take long, deep breaths using this tool can help you keep your lungs clear and active. This may help to reverse or lessen your chance of developing breathing (pulmonary) problems, especially infection. You may be asked to use  a spirometer: After a surgery. If you have a lung problem or a history of smoking. After a long period of time when you have been unable to move or be active. If the spirometer includes an indicator to show the highest number that you have reached, your health care provider or respiratory therapist will help you set a goal. Keep a log of your progress as told by your health care provider. What are the risks? Breathing too quickly may cause dizziness or cause you to pass out. Take your time so you do not get dizzy or light-headed. If you are in pain, you may need to take pain medicine before doing incentive spirometry.  It is harder to take a deep breath if you are having pain. How to use your incentive spirometer  Sit up on the edge of your bed or on a chair. Hold the incentive spirometer so that it is in an upright position. Before you use the spirometer, breathe out normally. Place the mouthpiece in your mouth. Make sure your lips are closed tightly around it. Breathe in slowly and as deeply as you can through your mouth, causing the piston or the ball to rise toward the top of the chamber. Hold your breath for 3-5 seconds, or for as long as possible. If the spirometer includes a coach indicator, use this to guide you in breathing. Slow down your breathing if the indicator goes above the marked areas. Remove the mouthpiece from your mouth and breathe out normally. The piston or ball will return to the bottom of the chamber. Rest for a few seconds, then repeat the steps 10 or more times. Take your time and take a few normal breaths between deep breaths so that you do not get dizzy or light-headed. Do this every 1-2 hours when you are awake. If the spirometer includes a goal marker to show the highest number you have reached (best effort), use this as a goal to work toward during each repetition. After each set of 10 deep breaths, cough a few times. This will help to make sure that your lungs  are clear. If you have an incision on your chest or abdomen from surgery, place a pillow or a rolled-up towel firmly against the incision when you cough. This can help to reduce pain while taking deep breaths and coughing. General tips When you are able to get out of bed: Walk around often. Continue to take deep breaths and cough in order to clear your lungs. Keep using the incentive spirometer until your health care provider says it is okay to stop using it. If you have been in the hospital, you may be told to keep using the spirometer at home. Contact a health care provider if: You are having difficulty using the spirometer. You have trouble using the spirometer as often as instructed. Your pain medicine is not giving enough relief for you to use the spirometer as told. You have a fever. Get help right away if: You develop shortness of breath. You develop a cough with bloody mucus from the lungs. You have fluid or blood coming from an incision site after you cough. Summary An incentive spirometer is a tool that can help you learn to take long, deep breaths to keep your lungs clear and active. You may be asked to use a spirometer after a surgery, if you have a lung problem or a history of smoking, or if you have been inactive for a long period of time. Use your incentive spirometer as instructed every 1-2 hours while you are awake. If you have an incision on your chest or abdomen, place a pillow or a rolled-up towel firmly against your incision when you cough. This will help to reduce pain. Get help right away if you have shortness of breath, you cough up bloody mucus, or blood comes from your incision when you cough. This information is not intended to replace advice given to you by your health care provider. Make sure you discuss any questions you have with your health care provider. Document Revised: 02/12/2023 Document Reviewed: 02/12/2023 Elsevier Patient Education  2024 [employer]   Preoperative Educational Videos for Total Hip,  Knee and Shoulder Replacements  To better prepare for surgery, please view our videos that explain the physical activity and discharge planning required to have the best surgical recovery at [facility] Eat.  [email or web address]  Questions? Call [phone] or email [email or web address]       Community Resource Directory to address health-related social needs:  [email or web address]

## 2024-05-12 NOTE — Telephone Encounter (Signed)
" ° °  Patient Name: Jacob Marks  DOB: 1960-05-21 MRN: [id number]  Primary Cardiologist: Evalene Lunger, MD  Chart reviewed as part of pre-operative protocol coverage.  Appears that this is a duplicate request. 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 Please call with questions.  Rollo FABIENE Louder, PA-C 05/12/2024, 7:58 AM  "

## 2024-05-16 ENCOUNTER — Inpatient Hospital Stay: Admission: RE | Admit: 2024-05-16 | Discharge: 2024-05-16 | Attending: Orthopedic Surgery

## 2024-05-16 DIAGNOSIS — Z01818 Encounter for other preprocedural examination: Secondary | ICD-10-CM | POA: Insufficient documentation

## 2024-05-16 DIAGNOSIS — Z01812 Encounter for preprocedural laboratory examination: Secondary | ICD-10-CM

## 2024-05-16 DIAGNOSIS — E119 Type 2 diabetes mellitus without complications: Secondary | ICD-10-CM | POA: Insufficient documentation

## 2024-05-16 LAB — URINALYSIS, ROUTINE W REFLEX MICROSCOPIC
Bilirubin Urine: NEGATIVE
Glucose, UA: NEGATIVE mg/dL
Hgb urine dipstick: NEGATIVE
Ketones, ur: NEGATIVE mg/dL
Leukocytes,Ua: NEGATIVE
Nitrite: NEGATIVE
Protein, ur: NEGATIVE mg/dL
Specific Gravity, Urine: 1.025 (ref 1.005–1.030)
pH: 6 (ref 5.0–8.0)

## 2024-05-16 LAB — COMPREHENSIVE METABOLIC PANEL WITH GFR
ALT: 19 U/L (ref 0–44)
AST: 22 U/L (ref 15–41)
Albumin: 4.4 g/dL (ref 3.5–5.0)
Alkaline Phosphatase: 102 U/L (ref 38–126)
Anion gap: 11 (ref 5–15)
BUN: 19 mg/dL (ref 8–23)
CO2: 25 mmol/L (ref 22–32)
Calcium: 9.7 mg/dL (ref 8.9–10.3)
Chloride: 104 mmol/L (ref 98–111)
Creatinine, Ser: 1.03 mg/dL (ref 0.61–1.24)
GFR, Estimated: >60 mL/min
Glucose, Bld: 121 mg/dL — ABNORMAL HIGH (ref 70–99)
Potassium: 3.6 mmol/L (ref 3.5–5.1)
Sodium: 140 mmol/L (ref 135–145)
Total Bilirubin: 0.9 mg/dL (ref 0.0–1.2)
Total Protein: 7 g/dL (ref 6.5–8.1)

## 2024-05-16 LAB — CBC WITH DIFFERENTIAL/PLATELET
Abs Immature Granulocytes: 0.04 10*3/uL (ref 0.00–0.07)
Basophils Absolute: 0.1 10*3/uL (ref 0.0–0.1)
Basophils Relative: 1 %
Eosinophils Absolute: 0.3 10*3/uL (ref 0.0–0.5)
Eosinophils Relative: 5 %
HCT: 45.8 % (ref 39.0–52.0)
Hemoglobin: 16 g/dL (ref 13.0–17.0)
Immature Granulocytes: 1 %
Lymphocytes Relative: 26 %
Lymphs Abs: 1.8 10*3/uL (ref 0.7–4.0)
MCH: 30 pg (ref 26.0–34.0)
MCHC: 34.9 g/dL (ref 30.0–36.0)
MCV: 85.8 fL (ref 80.0–100.0)
Monocytes Absolute: 0.5 10*3/uL (ref 0.1–1.0)
Monocytes Relative: 8 %
Neutro Abs: 4.1 10*3/uL (ref 1.7–7.7)
Neutrophils Relative %: 59 %
Platelets: 199 10*3/uL (ref 150–400)
RBC: 5.34 MIL/uL (ref 4.22–5.81)
RDW: 13.3 % (ref 11.5–15.5)
WBC: 6.8 10*3/uL (ref 4.0–10.5)
nRBC: 0 % (ref 0.0–0.2)

## 2024-05-16 LAB — HEMOGLOBIN A1C
Hgb A1c MFr Bld: 5.6 % (ref 4.8–5.6)
Mean Plasma Glucose: 114.02 mg/dL

## 2024-05-16 LAB — SURGICAL PCR SCREEN
MRSA, PCR: NEGATIVE
Staphylococcus aureus: NEGATIVE

## 2024-05-17 ENCOUNTER — Encounter: Payer: Self-pay | Admitting: Urgent Care

## 2024-05-17 NOTE — Progress Notes (Signed)
 " Perioperative / Anesthesia Services  Pre-Admission Testing Clinical Review / Pre-Operative Anesthesia Consult  Date: 05/17/24  PATIENT DEMOGRAPHICS: Name: Jacob Marks DOB: Jul 04, 1960 MRN:   [id number]  Note: Available PAT nursing documentation and vital signs have been reviewed. Clinical nursing staff has updated patient's PMH/PSHx, current medication list, and drug allergies/intolerances to ensure complete and comprehensive history available to assist care teams in MDM as it pertains to the aforementioned surgical procedure and anticipated anesthetic course. Extensive review of available clinical information personally performed. Nursing documentation reviewed. [place] PMH and PSHx updated with any diagnoses and/or procedures that I have knowledge of that may have been inadvertently omitted during his intake with the pre-admission testing department's nursing staff.  PLANNED SURGICAL PROCEDURE(S):   Case: [id number] Date/Time: 05/22/24 1232   Procedure: ARTHROPLASTY, KNEE, TOTAL (Left: Knee)   Anesthesia type: Choice   Diagnosis:      Left knee pain, unspecified chronicity [M25.562]     Primary osteoarthritis of left knee [M17.12]   Pre-op diagnosis:      Left knee pain, unspecified chronicity M25.562     Primary osteoarthritis of left knee M17.12   Location: [facility] OR ROOM 02 / [facility]   Surgeons: Lorelle Hussar, MD        CLINICAL DISCUSSION: Jacob Marks is a 64 y.o. male who is submitted for pre-surgical anesthesia review and clearance prior to him undergoing the above procedure. Patient is a Former Smoker (2 pack years; quit 04/1978). Pertinent PMH includes: CAD, inferior STEMI, postoperative CVA, HTN, HLD, T2DM, OSAH (noncompliant with nocturnal PAP therapy), GERD (no daily Tx), diverticulitis (s/p hemicolectomy), anemia, remote RCC (s/p RIGHT nephrectomy), ED (on PDE5i), nephrolithiasis, OA, lumbar spondylosis.  Patient is followed by cardiology  (Gollan, MD). He was last seen in the cardiology clinic on 08/05/2023; notes reviewed. At the time of his clinic visit, patient doing well overall from a cardiovascular perspective. Patient denied any chest pain, shortness of breath, PND, orthopnea, palpitations, significant peripheral edema, weakness, fatigue, vertiginous symptoms, or presyncope/syncope. Patient with a past medical history significant for cardiovascular diagnoses. Documented physical exam was grossly benign, providing no evidence of acute exacerbation and/or decompensation of the patient's known cardiovascular conditions.  Patient suffered an inferior wall STEMI on 08/05/2012.    He underwent diagnostic LEFT heart catheterization revealing complete total occlusion of the distal RCA.  Coronary thrombectomy was performed and DES x 1 (unknown type) was placed.  TTE performed on 08/06/2012 revealed a normal left ventricular systolic function with an EF of 55-60%. There were no regional wall motion abnormalities. Left ventricular diastolic Doppler parameters consistent with abnormal relaxation (G1DD).  Left atrium mildly dilated right ventricular size and function normal . RV was normal. There was trivial mitral and tricuspid valve regurgitation.  All transvalvular gradients were noted to be normal providing no evidence of hemodynamically significant valvular stenosis. Aorta normal in size with no evidence of ectasia or aneurysmal dilatation.  Blood pressure reasonably controlled at 134/76 mmHg on currently prescribed diuretic (HCTZ), ACEi (lisinopril ), and beta-blocker (metoprolol  tartrate) therapies.  Patient is on atorvastatin  for his HLD diagnosis and ASCVD prevention.  Patient has a supply of short acting nitrates (NTG) to use on an as needed basis for recurrent angina/anginal equivalent symptoms; denied recent use.  In the setting of known cardiovascular diagnoses, it is important note that patient is on a PDE5i medication (tadalafil ) for  an erectile dysfunction diagnosis.  T2DM well-controlled on currently prescribed regimen; last HgbA1c at the  time of this visit with cardiology was 6.7% when checked on 02/03/2023.  Of note, A1c has been rechecked since patient was last seen by cardiology with further improvement down to 5.6% when checked on 05/16/2024.  Patient does have an OSAH diagnosis, however he is noncompliant with prescribed nocturnal PAP therapy. Patient is able to complete all of his  ADL/IADLs without cardiovascular limitation.  Per the DASI, patient is able to achieve at least 4 METS of physical activity without experiencing any significant degree of angina/anginal equivalent symptoms. No changes were made to his medication regimen during his visit with cardiology.  Patient scheduled to follow-up with outpatient cardiology in 12 months or sooner if needed.  Jacob Marks is scheduled for an elective ARTHROPLASTY, KNEE, TOTAL (Left: Knee) on 05/22/2024 with Dr. Arthea Sheer, MD. Given patient's past medical history significant for cardiovascular diagnoses, presurgical cardiac clearance was sought by the PAT team. Per cardiology, according to the Revised Cardiac Risk Index (RCRI), his Perioperative Risk of Major Cardiac Event is (%): 6.6. His Functional Capacity in METs is: 7.99 according to the [employer] Activity Status Index (DASI). Therefore, based on ACC/AHA guidelines, patient would be at ACCEPTABLE risk for the planned procedure without further cardiovascular testing.   In review of the patient's medication reconciliation, it is noted that he is on daily oral antithrombotic therapy. Given that patient's past medical history is significant for cardiovascular diagnoses, including but not limited to CAD, orthopedics has cleared patient to continue his daily low dose ASA throughout his perioperative course.  Patient has been updated on these directives from his specialty care providers by the PAT team.  Patient denies previous  perioperative complications with anesthesia in the past. In review his EMR, it is noted that patient underwent a general anesthetic course at [facility] ([facility]) in 02/2021 without documented complications.   MOST RECENT VITAL SIGNS:    05/12/2024   10:00 AM 08/05/2023   10:41 AM 03/09/2022   11:40 AM  Vitals with BMI  Height 5' 8 5' 8 5' 8  Weight  262 lbs 6 oz 261 lbs 4 oz  BMI  39.91 39.73  Systolic  134 128  Diastolic  76 70  Pulse  62 61   PROVIDERS/SPECIALISTS: NOTE: Primary physician provider listed below. Patient may have been seen by APP or partner within same practice.   PROVIDER ROLE / SPECIALTY LAST Jacob Marks Arthea, MD Orthopedics (Surgeon) 05/16/2024  Coletti, Nelwyn Agent, MD Primary Care Provider 05/25/2023  Perla Lye, MD Cardiology 08/05/2023; preop APP call 03/22/2024   ALLERGIES: Hydromorphone  hcl and Sildenafil citrate  CURRENT HOME MEDICATIONS:  aspirin  EC 81 MG tablet   atorvastatin  (LIPITOR) 40 MG tablet   diphenhydramine -acetaminophen  (TYLENOL  PM) 25-500 MG TABS tablet   hydrochlorothiazide (HYDRODIURIL) 25 MG tablet   ibuprofen (ADVIL,MOTRIN) 200 MG tablet   lisinopril  (ZESTRIL ) 20 MG tablet   loratadine  (CLARITIN ) 10 MG tablet   metoprolol  tartrate (LOPRESSOR ) 25 MG tablet   Multiple Vitamin (MULTIVITAMIN ADULT PO)   nitroGLYCERIN  (NITROSTAT ) 0.4 MG SL tablet   Pseudoephedrine-Ibuprofen 30-200 MG TABS   tadalafil  (CIALIS ) 5 MG tablet   tirzepatide (MOUNJARO) 15 MG/0.5ML Pen   HISTORY: Past Medical History:  Diagnosis Date   Anemia    Arthritis    Childhood asthma    Coronary artery disease    a.) s/p inferior STEMI 08/05/2012 -- PCI 100% dRCA (unk type DES placed)   CVA (cerebral infarction) 10/2010   a.)  postop hemicolectomy - some paralysis right side of mouth and slight speech problem as a result   Depression    Diverticulitis 10/2010   a.) s/p hemicolectomy with ileostomy  (suffered post-op CVA); ileostomy has been revised   DM (diabetes mellitus), type 2 (HCC)    ED (erectile dysfunction)    a.) on PED5i (tadalafil )   GERD (gastroesophageal reflux disease)    History of heart murmur in childhood    Hyperlipidemia    Hypertension    Kidney stones    Long-term use of aspirin  therapy    Lumbar spondylosis    OSA (obstructive sleep apnea)    has CPAP; doesn't wear it (05-12-24)   Pes planus    Renal cell cancer (HCC)    a.) s/p RIGHT nephrectomy   Renal cyst, right    ST elevation myocardial infarction (STEMI) of inferior wall (HCC) 08/05/2012   a.) LHC/PCI 08/05/2012: 100% dRCA --> coronary thrombectomy + DES (unk type) placed   Testosterone  deficiency    Vertigo, benign paroxysmal    Past Surgical History:  Procedure Laterality Date   ABDOMINAL EXPLORATION SURGERY  05/07/2014   w/LO;&  Repair multiple, incarcerated incisional hernias with mesh   CARDIAC CATHETERIZATION     CORONARY ANGIOPLASTY WITH STENT PLACEMENT Left 08/05/2012   Procedure: CORONARY ANGIOPLASTY WITH STENT PLACEMENT; Location: [facility]; Surgeon: Deatrice Cage, MD   EXTRACORPOREAL SHOCK WAVE LITHOTRIPSY  2014   HEMICOLECTOMY  10/2010   for diverticulosis, complic by leaking anastamosis/ abcess/ addn surg and iliostomy    HERNIA REPAIR  05/07/2014   8 repairs   ILEOSTOMY  10/2010   ILEOSTOMY CLOSURE  06/2011   INCISIONAL HERNIA REPAIR N/A 05/07/2014   Procedure: REPAIR RECURRENT INCISIONAL HERNIA;  Surgeon: Elon Pacini, MD;  Location: [facility] OR;  Service: General;  Laterality: N/A;   INSERTION OF MESH N/A 05/07/2014   Procedure: INSERTION OF MESH;  Surgeon: Elon Pacini, MD;  Location: [facility] OR;  Service: General;  Laterality: N/A;   KNEE ARTHROTOMY Right 10/28/2017   Procedure: KNEE OPEN LATERAL RELEASE WITH MEDIAL CAPSULE REEFING;  Surgeon: Kathlynn Sharper, MD;  Location: [facility] ORS;  Service: Orthopedics;  Laterality: Right;   ROBOTIC ASSITED PARTIAL NEPHRECTOMY Right 05/08/2016    Procedure: ROBOTIC ASSITED RETROPERITONEAL PARTIAL NEPHRECTOMY;  Surgeon: Ricardo Likens, MD;  Location: [facility] ORS;  Service: Urology;  Laterality: Right;   TESTICLE SURGERY     as a child; they didn't descend   TONSILLECTOMY AND ADENOIDECTOMY     as a child   TOTAL KNEE ARTHROPLASTY Right 08/17/2017   Procedure: TOTAL KNEE ARTHROPLASTY;  Surgeon: Kathlynn Sharper, MD;  Location: [facility] ORS;  Service: Orthopedics;  Laterality: Right;   VASECTOMY     Family History  Problem Relation Age of Onset   Heart disease Mother 67       MI   Hypertension Mother    Alcohol abuse Mother    Heart disease Father    Hypertension Father    Cancer Sister        colon   Social History   Tobacco Use   Smoking status: Former    Current packs/day: 0.00    Average packs/day: 1 pack/day for 2.0 years (2.0 ttl pk-yrs)    Types: Cigarettes    Start date: 04/20/1976    Quit date: 04/20/1978    Years since quitting: 46.1   Smokeless tobacco: Never  Substance Use Topics   Alcohol use: Yes    Alcohol/week: 0.0 standard drinks of alcohol  Comment: occasional   LABS:  Hospital Outpatient Visit on 05/16/2024  Component Date Value Ref Range Status   MRSA, PCR 05/16/2024 NEGATIVE  NEGATIVE Final   Staphylococcus aureus 05/16/2024 NEGATIVE  NEGATIVE Final   Comment: (NOTE) The Xpert SA Assay (FDA approved for NASAL specimens in patients 68 years of age and older), is one component of a comprehensive surveillance program. It is not intended to diagnose infection nor to guide or monitor treatment. Performed at [facility], [address] [place], [place] [address]    Hgb A1c MFr Bld 05/16/2024 5.6  4.8 - 5.6 % Final   Comment: (NOTE) Diagnosis of Diabetes The following HbA1c ranges recommended by the American Diabetes Association (ADA) may be used as an aid in the diagnosis of diabetes mellitus.  Hemoglobin             Suggested A1C NGSP%              Diagnosis  <5.7                   Non  Diabetic  5.7-6.4                Pre-Diabetic  >6.4                   Diabetic  <7.0                   Glycemic control for                       adults with diabetes.     Mean Plasma Glucose 05/16/2024 114.02  mg/dL Final   Performed at [facility], [address] [place], [place] [address]   WBC 05/16/2024 6.8  4.0 - 10.5 K/uL Final   RBC 05/16/2024 5.34  4.22 - 5.81 MIL/uL Final   Hemoglobin 05/16/2024 16.0  13.0 - 17.0 g/dL Final   HCT 98/72/7973 45.8  39.0 - 52.0 % Final   MCV 05/16/2024 85.8  80.0 - 100.0 fL Final   MCH 05/16/2024 30.0  26.0 - 34.0 pg Final   MCHC 05/16/2024 34.9  30.0 - 36.0 g/dL Final   RDW 98/72/7973 13.3  11.5 - 15.5 % Final   Platelets 05/16/2024 199  150 - 400 K/uL Final   nRBC 05/16/2024 0.0  0.0 - 0.2 % Final   Neutrophils Relative % 05/16/2024 59  % Final   Neutro Abs 05/16/2024 4.1  1.7 - 7.7 K/uL Final   Lymphocytes Relative 05/16/2024 26  % Final   Lymphs Abs 05/16/2024 1.8  0.7 - 4.0 K/uL Final   Monocytes Relative 05/16/2024 8  % Final   Monocytes Absolute 05/16/2024 0.5  0.1 - 1.0 K/uL Final   Eosinophils Relative 05/16/2024 5  % Final   Eosinophils Absolute 05/16/2024 0.3  0.0 - 0.5 K/uL Final   Basophils Relative 05/16/2024 1  % Final   Basophils Absolute 05/16/2024 0.1  0.0 - 0.1 K/uL Final   Immature Granulocytes 05/16/2024 1  % Final   Abs Immature Granulocytes 05/16/2024 0.04  0.00 - 0.07 K/uL Final   Performed at [facility], [address] [place], [place] [address]   Sodium 05/16/2024 140  135 - 145 mmol/L Final   Potassium 05/16/2024 3.6  3.5 - 5.1 mmol/L Final   Chloride 05/16/2024 104  98 - 111 mmol/L Final   CO2 05/16/2024 25  22 - 32 mmol/L Final  Glucose, Bld 05/16/2024 121 (H)  70 - 99 mg/dL Final   Glucose reference range applies only to samples taken after fasting for at least 8 hours.   BUN 05/16/2024 19  8 - 23 mg/dL Final   Creatinine, Ser 05/16/2024 1.03  0.61 - 1.24 mg/dL Final   Calcium   05/16/2024 9.7  8.9 - 10.3 mg/dL Final   Total Protein 98/72/7973 7.0  6.5 - 8.1 g/dL Final   Albumin 98/72/7973 4.4  3.5 - 5.0 g/dL Final   AST 98/72/7973 22  15 - 41 U/L Final   ALT 05/16/2024 19  0 - 44 U/L Final   Alkaline Phosphatase 05/16/2024 102  38 - 126 U/L Final   Total Bilirubin 05/16/2024 0.9  0.0 - 1.2 mg/dL Final   GFR, Estimated 05/16/2024 >60  >60 mL/min Final   Comment: (NOTE) Calculated using the CKD-EPI Creatinine Equation (2021)    Anion gap 05/16/2024 11  5 - 15 Final   Performed at [facility], [address] [place], [place] [address]   Color, Urine 05/16/2024 YELLOW (A)  YELLOW Final   APPearance 05/16/2024 CLEAR (A)  CLEAR Final   Specific Gravity, Urine 05/16/2024 1.025  1.005 - 1.030 Final   pH 05/16/2024 6.0  5.0 - 8.0 Final   Glucose, UA 05/16/2024 NEGATIVE  NEGATIVE mg/dL Final   Hgb urine dipstick 05/16/2024 NEGATIVE  NEGATIVE Final   Bilirubin Urine 05/16/2024 NEGATIVE  NEGATIVE Final   Ketones, ur 05/16/2024 NEGATIVE  NEGATIVE mg/dL Final   Protein, ur 98/72/7973 NEGATIVE  NEGATIVE mg/dL Final   Nitrite 98/72/7973 NEGATIVE  NEGATIVE Final   Leukocytes,Ua 05/16/2024 NEGATIVE  NEGATIVE Final   Performed at [facility], [address] [place], [place] [address]    ECG: Date: 05/16/2024  Time ECG obtained: 1218 PM Rate: 72 bpm Rhythm: normal sinus Axis (leads I and aVF): normal Intervals: PR 176 ms. QRS 78 ms. QTc 405 ms. ST segment and T wave changes: No evidence of acute T wave abnormalities or significant ST segment elevation or depression.  Evidence of a possible, age undetermined, prior infarct:  No Comparison: Similar to previous tracing obtained on 08/05/2023   IMAGING / PROCEDURES: DIAGNOSTIC RADIOGRAPHS OF LEFT KNEE 4+ VIEWS performed on 03/10/2024 Severe tricompartmental degenerative changes with joint space narrowing, sclerosis, osteophyte formation and subchondral cystic changes.  There is valgus deformity  with lateral bone-on-bone and patellofemoral bone-on-bone articulation.  Kellgren-Lawrence grade 4.   AP, sunrise and flexed PA of the right knee show status post cemented total knee arthroplasty with patellar resurfacing.   Components in unchanged position from prior films no evidence of periprosthetic fracture or obvious   MR LUMBAR SPINE WO CONTRAST performed on 12/06/2020 Lumbar spine spondylosis as described above. No acute osseous injury of the lumbar spine  IMPRESSION AND PLAN: Staley Lunz Gilardi has been referred for pre-anesthesia review and clearance prior to him undergoing the planned anesthetic and procedural courses. Available labs, pertinent testing, and imaging results were personally reviewed by me in preparation for upcoming operative/procedural course. [facility] medical record has been updated following extensive record review and patient interview with PAT staff.   This patient has been appropriately cleared by cardiology with an overall ACCEPTABLE risk of patient experiencing significant perioperative cardiovascular complications. [facility] at [facility]. Based on clinical review performed today (05/17/24), barring any significant acute changes in the patient's overall condition, it is anticipated that he will be able to proceed with the planned  surgical intervention. Any acute changes in clinical condition may necessitate his procedure being postponed and/or cancelled. Patient will meet with anesthesia team (MD and/or CRNA) on the day of his procedure for preoperative evaluation/assessment. Questions regarding anesthetic course will be fielded at that time.   Pre-surgical instructions were reviewed with the patient during his PAT appointment, and questions were fielded to satisfaction by PAT clinical staff. He has been instructed on which medications that he will need to hold prior to surgery, as well as the ones that have been deemed safe/appropriate to  take on the day of his procedure. As part of the general education provided by PAT, patient made aware both verbally and in writing, that he would need to abstain from the use of any illegal substances during his perioperative course. He was advised that failure to follow the provided instructions could necessitate case cancellation or result in serious perioperative complications up to and including death. Patient encouraged to contact PAT and/or his surgeon's office to discuss any questions or concerns that may arise prior to surgery; verbalized understanding.   Dorise Pereyra, MSN, APRN, FNP-C, CEN [facility]  Perioperative Services Nurse Practitioner Phone: [phone] Fax: [phone] 05/17/24 9:39 AM  NOTE: This note has been prepared using Dragon dictation software. Despite my best ability to proofread, there is always the potential that unintentional transcriptional errors may still occur from this process. "

## 2024-05-18 ENCOUNTER — Other Ambulatory Visit: Payer: Self-pay | Admitting: Orthopedic Surgery

## 2024-05-19 MED ORDER — CEFAZOLIN SODIUM-DEXTROSE 2-4 GM/100ML-% IV SOLN
INTRAVENOUS | Status: AC
  Filled 2024-05-19: qty 100

## 2024-05-21 MED ORDER — DEXAMETHASONE SOD PHOSPHATE PF 10 MG/ML IJ SOLN: 8.0000 mg | Freq: Once | INTRAMUSCULAR | Status: DC

## 2024-05-21 MED ORDER — CEFAZOLIN SODIUM-DEXTROSE 2-4 GM/100ML-% IV SOLN
2.0000 g | INTRAVENOUS | Status: AC
  Administered 2024-05-22: 2 g via INTRAVENOUS

## 2024-05-21 MED ORDER — ORAL CARE MOUTH RINSE: 15.0000 mL | Freq: Once | OROMUCOSAL | Status: AC

## 2024-05-21 MED ORDER — SODIUM CHLORIDE 0.9 % IV SOLN: INTRAVENOUS | Status: DC

## 2024-05-21 MED ORDER — CHLORHEXIDINE GLUCONATE 0.12 % MT SOLN
15.0000 mL | Freq: Once | OROMUCOSAL | Status: AC
  Administered 2024-05-22: 15 mL via OROMUCOSAL

## 2024-05-21 MED ORDER — TRANEXAMIC ACID-NACL 1000-0.7 MG/100ML-% IV SOLN: 1000.0000 mg | INTRAVENOUS | Status: DC

## 2024-05-22 ENCOUNTER — Ambulatory Visit

## 2024-05-22 ENCOUNTER — Ambulatory Visit: Payer: Self-pay | Admitting: Urgent Care

## 2024-05-22 ENCOUNTER — Encounter: Admission: RE | Disposition: A | Payer: Self-pay | Source: Home / Self Care | Attending: Orthopedic Surgery

## 2024-05-22 ENCOUNTER — Other Ambulatory Visit: Payer: Self-pay

## 2024-05-22 ENCOUNTER — Ambulatory Visit
Admission: RE | Admit: 2024-05-22 | Discharge: 2024-05-23 | Disposition: A | Attending: Orthopedic Surgery | Admitting: Orthopedic Surgery

## 2024-05-22 ENCOUNTER — Encounter: Payer: Self-pay | Admitting: Orthopedic Surgery

## 2024-05-22 DIAGNOSIS — Z6839 Body mass index (BMI) 39.0-39.9, adult: Secondary | ICD-10-CM | POA: Insufficient documentation

## 2024-05-22 DIAGNOSIS — I25118 Atherosclerotic heart disease of native coronary artery with other forms of angina pectoris: Secondary | ICD-10-CM | POA: Insufficient documentation

## 2024-05-22 DIAGNOSIS — M25762 Osteophyte, left knee: Secondary | ICD-10-CM | POA: Insufficient documentation

## 2024-05-22 DIAGNOSIS — M1712 Unilateral primary osteoarthritis, left knee: Secondary | ICD-10-CM | POA: Insufficient documentation

## 2024-05-22 DIAGNOSIS — G4733 Obstructive sleep apnea (adult) (pediatric): Secondary | ICD-10-CM | POA: Insufficient documentation

## 2024-05-22 DIAGNOSIS — Z91199 Patient's noncompliance with other medical treatment and regimen due to unspecified reason: Secondary | ICD-10-CM | POA: Insufficient documentation

## 2024-05-22 DIAGNOSIS — E785 Hyperlipidemia, unspecified: Secondary | ICD-10-CM | POA: Insufficient documentation

## 2024-05-22 DIAGNOSIS — M21062 Valgus deformity, not elsewhere classified, left knee: Secondary | ICD-10-CM | POA: Insufficient documentation

## 2024-05-22 DIAGNOSIS — Z87891 Personal history of nicotine dependence: Secondary | ICD-10-CM | POA: Insufficient documentation

## 2024-05-22 DIAGNOSIS — I252 Old myocardial infarction: Secondary | ICD-10-CM | POA: Insufficient documentation

## 2024-05-22 DIAGNOSIS — Z905 Acquired absence of kidney: Secondary | ICD-10-CM | POA: Insufficient documentation

## 2024-05-22 DIAGNOSIS — Z8673 Personal history of transient ischemic attack (TIA), and cerebral infarction without residual deficits: Secondary | ICD-10-CM | POA: Insufficient documentation

## 2024-05-22 DIAGNOSIS — Z79899 Other long term (current) drug therapy: Secondary | ICD-10-CM | POA: Insufficient documentation

## 2024-05-22 DIAGNOSIS — Z96652 Presence of left artificial knee joint: Secondary | ICD-10-CM

## 2024-05-22 DIAGNOSIS — N189 Chronic kidney disease, unspecified: Secondary | ICD-10-CM | POA: Insufficient documentation

## 2024-05-22 DIAGNOSIS — Z9049 Acquired absence of other specified parts of digestive tract: Secondary | ICD-10-CM | POA: Insufficient documentation

## 2024-05-22 DIAGNOSIS — Z85528 Personal history of other malignant neoplasm of kidney: Secondary | ICD-10-CM | POA: Insufficient documentation

## 2024-05-22 DIAGNOSIS — E66813 Obesity, class 3: Secondary | ICD-10-CM | POA: Insufficient documentation

## 2024-05-22 DIAGNOSIS — E1122 Type 2 diabetes mellitus with diabetic chronic kidney disease: Secondary | ICD-10-CM | POA: Insufficient documentation

## 2024-05-22 DIAGNOSIS — N529 Male erectile dysfunction, unspecified: Secondary | ICD-10-CM | POA: Insufficient documentation

## 2024-05-22 DIAGNOSIS — I129 Hypertensive chronic kidney disease with stage 1 through stage 4 chronic kidney disease, or unspecified chronic kidney disease: Secondary | ICD-10-CM | POA: Insufficient documentation

## 2024-05-22 HISTORY — DX: Cyst of kidney, acquired: N28.1

## 2024-05-22 HISTORY — DX: Personal history of other diseases of the circulatory system: Z86.79

## 2024-05-22 HISTORY — DX: Spondylosis without myelopathy or radiculopathy, lumbar region: M47.816

## 2024-05-22 HISTORY — DX: Long term (current) use of aspirin: Z79.82

## 2024-05-22 LAB — GLUCOSE, CAPILLARY
Glucose-Capillary: 63 mg/dL — ABNORMAL LOW (ref 70–99)
Glucose-Capillary: 82 mg/dL (ref 70–99)
Glucose-Capillary: 122 mg/dL — ABNORMAL HIGH (ref 70–99)

## 2024-05-22 MED ORDER — DEXAMETHASONE SOD PHOSPHATE PF 10 MG/ML IJ SOLN
INTRAMUSCULAR | Status: DC | PRN
  Administered 2024-05-22: 10 mg via INTRAVENOUS

## 2024-05-22 MED ORDER — METOCLOPRAMIDE HCL 10 MG PO TABS: 5.0000 mg | ORAL_TABLET | Freq: Three times a day (TID) | ORAL | Status: DC | PRN

## 2024-05-22 MED ORDER — BUPIVACAINE LIPOSOME 1.3 % IJ SUSP
INTRAMUSCULAR | Status: AC
  Filled 2024-05-22: qty 20

## 2024-05-22 MED ORDER — ONDANSETRON HCL 4 MG/2ML IJ SOLN
INTRAMUSCULAR | Status: AC
  Filled 2024-05-22: qty 2

## 2024-05-22 MED ORDER — OXYCODONE HCL 5 MG PO TABS: 5.0000 mg | ORAL_TABLET | Freq: Once | ORAL | Status: DC | PRN

## 2024-05-22 MED ORDER — LORATADINE 10 MG PO TABS
10.0000 mg | ORAL_TABLET | Freq: Every day | ORAL | Status: DC
  Administered 2024-05-23: 10 mg via ORAL
  Filled 2024-05-22 (×2): qty 1

## 2024-05-22 MED ORDER — TRANEXAMIC ACID-NACL 1000-0.7 MG/100ML-% IV SOLN
INTRAVENOUS | Status: AC
  Filled 2024-05-22: qty 100

## 2024-05-22 MED ORDER — HYDROCHLOROTHIAZIDE 25 MG PO TABS
25.0000 mg | ORAL_TABLET | Freq: Every day | ORAL | Status: DC
  Administered 2024-05-23: 25 mg via ORAL
  Filled 2024-05-22 (×2): qty 1

## 2024-05-22 MED ORDER — ACETAMINOPHEN 10 MG/ML IV SOLN
INTRAVENOUS | Status: DC | PRN
  Administered 2024-05-22: 1000 mg via INTRAVENOUS

## 2024-05-22 MED ORDER — DEXAMETHASONE SOD PHOSPHATE PF 10 MG/ML IJ SOLN
INTRAMUSCULAR | Status: AC
  Filled 2024-05-22: qty 1

## 2024-05-22 MED ORDER — DROPERIDOL 2.5 MG/ML IJ SOLN: 0.6250 mg | Freq: Once | INTRAMUSCULAR | Status: DC | PRN

## 2024-05-22 MED ORDER — HYDROCODONE-ACETAMINOPHEN 5-325 MG PO TABS
1.0000 | ORAL_TABLET | ORAL | Status: DC | PRN
  Administered 2024-05-22: 2 via ORAL
  Administered 2024-05-23: 1 via ORAL
  Filled 2024-05-22: qty 2
  Filled 2024-05-22: qty 1

## 2024-05-22 MED ORDER — CEFAZOLIN SODIUM-DEXTROSE 2-4 GM/100ML-% IV SOLN
2.0000 g | Freq: Four times a day (QID) | INTRAVENOUS | Status: AC
  Administered 2024-05-22 – 2024-05-23 (×2): 2 g via INTRAVENOUS
  Filled 2024-05-22 (×2): qty 100

## 2024-05-22 MED ORDER — LISINOPRIL 20 MG PO TABS
ORAL_TABLET | ORAL | Status: AC
  Filled 2024-05-22: qty 1

## 2024-05-22 MED ORDER — SENNA 8.6 MG PO TABS
1.0000 | ORAL_TABLET | Freq: Every day | ORAL | Status: DC
  Administered 2024-05-22 – 2024-05-23 (×2): 8.6 mg via ORAL
  Filled 2024-05-22 (×2): qty 1

## 2024-05-22 MED ORDER — ACETAMINOPHEN 325 MG PO TABS: 325.0000 mg | ORAL_TABLET | Freq: Four times a day (QID) | ORAL | Status: DC | PRN

## 2024-05-22 MED ORDER — ACETAMINOPHEN 500 MG PO TABS
1000.0000 mg | ORAL_TABLET | Freq: Three times a day (TID) | ORAL | Status: DC
  Administered 2024-05-23: 1000 mg via ORAL
  Filled 2024-05-22: qty 2

## 2024-05-22 MED ORDER — TRANEXAMIC ACID-NACL 1000-0.7 MG/100ML-% IV SOLN
INTRAVENOUS | Status: DC | PRN
  Administered 2024-05-22 (×2): 1000 mg via INTRAVENOUS

## 2024-05-22 MED ORDER — PROPOFOL 10 MG/ML IV BOLUS
INTRAVENOUS | Status: DC | PRN
  Administered 2024-05-22: 50 mg via INTRAVENOUS

## 2024-05-22 MED ORDER — DEXMEDETOMIDINE HCL IN NACL 80 MCG/20ML IV SOLN
INTRAVENOUS | Status: DC | PRN
  Administered 2024-05-22: 8 ug via INTRAVENOUS
  Administered 2024-05-22: 4 ug via INTRAVENOUS

## 2024-05-22 MED ORDER — FENTANYL CITRATE (PF) 100 MCG/2ML IJ SOLN
INTRAMUSCULAR | Status: DC | PRN
  Administered 2024-05-22 (×2): 50 ug via INTRAVENOUS

## 2024-05-22 MED ORDER — ONDANSETRON HCL 4 MG PO TABS: 4.0000 mg | ORAL_TABLET | Freq: Four times a day (QID) | ORAL | Status: DC | PRN

## 2024-05-22 MED ORDER — METOCLOPRAMIDE HCL 5 MG/ML IJ SOLN: 5.0000 mg | Freq: Three times a day (TID) | INTRAMUSCULAR | Status: DC | PRN

## 2024-05-22 MED ORDER — LIDOCAINE HCL (PF) 2 % IJ SOLN
INTRAMUSCULAR | Status: AC
  Filled 2024-05-22: qty 5

## 2024-05-22 MED ORDER — SODIUM CHLORIDE 0.9 % IV SOLN: INTRAVENOUS | Status: DC

## 2024-05-22 MED ORDER — ACETAMINOPHEN 10 MG/ML IV SOLN: 1000.0000 mg | Freq: Once | INTRAVENOUS | Status: DC | PRN

## 2024-05-22 MED ORDER — FENTANYL CITRATE (PF) 100 MCG/2ML IJ SOLN: 25.0000 ug | INTRAMUSCULAR | Status: DC | PRN

## 2024-05-22 MED ORDER — DEXTROSE 50 % IV SOLN
INTRAVENOUS | Status: AC
  Filled 2024-05-22: qty 50

## 2024-05-22 MED ORDER — CHLORHEXIDINE GLUCONATE 0.12 % MT SOLN
OROMUCOSAL | Status: AC
  Filled 2024-05-22: qty 15

## 2024-05-22 MED ORDER — FENTANYL CITRATE (PF) 100 MCG/2ML IJ SOLN
INTRAMUSCULAR | Status: AC
  Filled 2024-05-22: qty 2

## 2024-05-22 MED ORDER — MORPHINE SULFATE (PF) 2 MG/ML IV SOLN: 0.5000 mg | INTRAVENOUS | Status: DC | PRN

## 2024-05-22 MED ORDER — EPHEDRINE SULFATE-NACL 50-0.9 MG/10ML-% IV SOSY
PREFILLED_SYRINGE | INTRAVENOUS | Status: DC | PRN
  Administered 2024-05-22 (×2): 5 mg via INTRAVENOUS

## 2024-05-22 MED ORDER — BUPIVACAINE HCL (PF) 0.5 % IJ SOLN
INTRAMUSCULAR | Status: DC | PRN
  Administered 2024-05-22: 3 mL

## 2024-05-22 MED ORDER — OXYCODONE HCL 5 MG/5ML PO SOLN: 5.0000 mg | Freq: Once | ORAL | Status: DC | PRN

## 2024-05-22 MED ORDER — METOPROLOL TARTRATE 25 MG PO TABS
25.0000 mg | ORAL_TABLET | Freq: Two times a day (BID) | ORAL | Status: DC
  Administered 2024-05-22 – 2024-05-23 (×2): 25 mg via ORAL
  Filled 2024-05-22 (×2): qty 1

## 2024-05-22 MED ORDER — SURGIPHOR WOUND IRRIGATION SYSTEM - OPTIME: TOPICAL | Status: DC | PRN

## 2024-05-22 MED ORDER — PROPOFOL 500 MG/50ML IV EMUL
INTRAVENOUS | Status: DC | PRN
  Administered 2024-05-22: 75 ug/kg/min via INTRAVENOUS

## 2024-05-22 MED ORDER — BUPIVACAINE-EPINEPHRINE (PF) 0.25% -1:200000 IJ SOLN
INTRAMUSCULAR | Status: AC
  Filled 2024-05-22: qty 30

## 2024-05-22 MED ORDER — DEXTROSE 50 % IV SOLN
25.0000 mL | Freq: Once | INTRAVENOUS | Status: AC
  Administered 2024-05-22: 25 mL via INTRAVENOUS

## 2024-05-22 MED ORDER — SODIUM CHLORIDE 0.9 % IR SOLN
Status: DC | PRN
  Administered 2024-05-22: 3000 mL

## 2024-05-22 MED ORDER — ONDANSETRON HCL 4 MG/2ML IJ SOLN
INTRAMUSCULAR | Status: DC | PRN
  Administered 2024-05-22: 4 mg via INTRAVENOUS

## 2024-05-22 MED ORDER — ATORVASTATIN CALCIUM 10 MG PO TABS
40.0000 mg | ORAL_TABLET | Freq: Every day | ORAL | Status: DC
  Administered 2024-05-22 – 2024-05-23 (×2): 40 mg via ORAL
  Filled 2024-05-22: qty 4

## 2024-05-22 MED ORDER — MENTHOL 3 MG MT LOZG: 1.0000 | LOZENGE | OROMUCOSAL | Status: DC | PRN

## 2024-05-22 MED ORDER — CEFAZOLIN SODIUM-DEXTROSE 2-4 GM/100ML-% IV SOLN
INTRAVENOUS | Status: AC
  Filled 2024-05-22: qty 100

## 2024-05-22 MED ORDER — SODIUM CHLORIDE (PF) 0.9 % IJ SOLN
INTRAMUSCULAR | Status: DC | PRN
  Administered 2024-05-22: 70 mL

## 2024-05-22 MED ORDER — SODIUM CHLORIDE (PF) 0.9 % IJ SOLN
INTRAMUSCULAR | Status: AC
  Filled 2024-05-22: qty 20

## 2024-05-22 MED ORDER — LISINOPRIL 20 MG PO TABS
20.0000 mg | ORAL_TABLET | Freq: Every day | ORAL | Status: DC
  Administered 2024-05-22 – 2024-05-23 (×2): 20 mg via ORAL

## 2024-05-22 MED ORDER — TRAMADOL HCL 50 MG PO TABS
50.0000 mg | ORAL_TABLET | Freq: Four times a day (QID) | ORAL | Status: DC | PRN
  Administered 2024-05-22 – 2024-05-23 (×2): 50 mg via ORAL
  Filled 2024-05-22 (×3): qty 1

## 2024-05-22 MED ORDER — ENOXAPARIN SODIUM 30 MG/0.3ML IJ SOSY
30.0000 mg | PREFILLED_SYRINGE | Freq: Two times a day (BID) | INTRAMUSCULAR | Status: DC
  Administered 2024-05-23: 30 mg via SUBCUTANEOUS
  Filled 2024-05-22: qty 0.3

## 2024-05-22 MED ORDER — PANTOPRAZOLE SODIUM 40 MG PO TBEC
40.0000 mg | DELAYED_RELEASE_TABLET | Freq: Every day | ORAL | Status: DC
  Administered 2024-05-23: 40 mg via ORAL
  Filled 2024-05-22 (×2): qty 1

## 2024-05-22 MED ORDER — MIDAZOLAM HCL 5 MG/5ML IJ SOLN
INTRAMUSCULAR | Status: DC | PRN
  Administered 2024-05-22 (×2): 1 mg via INTRAVENOUS

## 2024-05-22 MED ORDER — PROPOFOL 1000 MG/100ML IV EMUL
INTRAVENOUS | Status: AC
  Filled 2024-05-22: qty 100

## 2024-05-22 MED ORDER — LIDOCAINE HCL (CARDIAC) PF 100 MG/5ML IV SOSY
PREFILLED_SYRINGE | INTRAVENOUS | Status: DC | PRN
  Administered 2024-05-22: 100 mg via INTRAVENOUS

## 2024-05-22 MED ORDER — MIDAZOLAM HCL 2 MG/2ML IJ SOLN
INTRAMUSCULAR | Status: AC
  Filled 2024-05-22: qty 2

## 2024-05-22 MED ORDER — PHENOL 1.4 % MT LIQD: 1.0000 | OROMUCOSAL | Status: DC | PRN

## 2024-05-22 MED ORDER — BUPIVACAINE HCL (PF) 0.5 % IJ SOLN
INTRAMUSCULAR | Status: AC
  Filled 2024-05-22: qty 10

## 2024-05-22 MED ORDER — ASPIRIN 81 MG PO TBEC
81.0000 mg | DELAYED_RELEASE_TABLET | Freq: Every day | ORAL | Status: DC
  Administered 2024-05-23: 81 mg via ORAL
  Filled 2024-05-22: qty 1

## 2024-05-22 MED ORDER — ACETAMINOPHEN 10 MG/ML IV SOLN
INTRAVENOUS | Status: AC
  Filled 2024-05-22: qty 100

## 2024-05-22 MED ORDER — ONDANSETRON HCL 4 MG/2ML IJ SOLN: 4.0000 mg | Freq: Four times a day (QID) | INTRAMUSCULAR | Status: DC | PRN

## 2024-05-22 NOTE — Op Note (Signed)
 Patient Name: Jacob Marks  FMW:[id number]  Pre-Operative Diagnosis: Left knee Osteoarthritis  Post-Operative Diagnosis: (same)  Procedure: Left Total Knee Arthroplasty  Components/Implants: Femur: Persona Size 7 CR PPS   Tibia: Persona Size F OsseoTi  Poly: 10mm MC  Patella: 35x9.33mm symmetric OsseoTi  Femoral Valgus Cut Angle: 5 degrees  Distal Femoral Re-cut: none  Patella Resurfacing: yes   Date of Surgery: 05/22/2024  Surgeon: Arthea Sheer MD  Assistant: [facility] RNFA (present and scrubbed throughout the case, critical for assistance with exposure, retraction, instrumentation, and closure)   Anesthesiologist: Adams  Anesthesia: Spinal   Tourniquet Time:  EBL: 25cc  IVF: 600cc  Complications: None   Brief history: The patient is a 64 year old male with a history of osteoarthritis of the left knee with pain limiting their range of motion and activities of daily living, which has failed multiple attempts at conservative therapy.  The risks and benefits of total knee arthroplasty as definitive surgical treatment were discussed with the patient, who opted to proceed with the operation.  After outpatient medical clearance and optimization was completed the patient was admitted to [facility] for the procedure.  All preoperative films were reviewed and an appropriate surgical plan was made prior to surgery. Preoperative range of motion was -5 to 110 with 5 degrees of hyperextension. The patient was identified as having a Valgus alignment.   Description of procedure: The patient was brought to the operating room where laterality was confirmed by all those present to be the left side.   Spinal anesthesia was administered and the patient received an intravenous dose of antibiotics for surgical prophylaxis and a dose of tranexamic acid .  Patient is positioned supine on the operating room table with all bony prominences well-padded.  A  well-padded tourniquet was applied to the left thigh.  The knee was then prepped and draped in usual sterile fashion with multiple layers of adhesive and nonadhesive drapes.  All of those present in the operating room participated in a surgical timeout laterality and patient were confirmed.   An Esmarch was wrapped around the extremity and the leg was elevated and the knee flexed.  The tourniquet was inflated to a pressure of 250 mmHg. The Esmarch was removed and the leg was brought down to full extension.  The patella and tibial tubercle identified and outlined using a marking pen and a midline skin incision was made with a knife carried through the subcutaneous tissue down to the extensor retinaculum.  After exposure of the extensor mechanism the medial parapatellar arthrotomy was performed with a scalpel and electrocautery extending down medial and distal to the tibial tubercle taking care to avoid incising the patellar tendon.   A standard medial release was performed over the proximal tibia.  The knee was brought into extension in order to excise the fat pad taking care not to damage the patella tendon.  The superior soft tissue was removed from the anterior surface of the distal femur to visualize for the procedure.  The knee was then brought into flexion with the patella subluxed laterally and subluxing the tibia anteriorly.  The ACL was transected and removed with electrocautery and additional soft tissue was removed from the proximal surface of the tibia to fully expose. The PCL was found to be partially intact and was preserved.  An extramedullary tibial cutting guide was then applied to the leg with a spring-loaded ankle clamp placed around the distal tibia just above the malleoli the angulation of  the guide was adjusted to give some posterior slope in the tibial resection with an appropriate varus/valgus alignment.  The resection guide was then pinned to the proximal tibia and the proximal tibial  surface was resected with an oscillating saw.  Careful attention was paid to ensure the blade did not disrupt any of the soft tissues including any lateral or medial ligament.  Attention was then turned to the femur, with the knee slightly flexed a opening drill was used to enter the medullary canal of the femur.  After removing the drill marrow was suctioned out to decompress the distal femur.  An intramedullary femoral guide was then inserted into the drill hole and the alignment guide was seated firmly against the distal end of the medial femoral condyle.  The distal femoral cutting guide was then attached and pinned securely to the anterior surface of the femur and the intramedullary rod and alignment guide was removed.  Distal femur resection was then performed with an oscillating saw with retractors protecting medial and laterally.   The distal cutting block was then removed and the extension gap was checked with a spacer.  Extension gap was found to be appropriately sized to accommodate the spacer block.   The femoral sizing guide was then placed securely into the posterior condyles of the femur and the femoral size was measured and determined to be 7.  The size 7; 4-in-1 cutting guide was placed in position and secured with 2 pins.  The anterior posterior and chamfer resections were then performed with an oscillating saw.  Bony fragments and osteophytes were then removed.  Using a lamina spreader the posterior medial and lateral condyles were checked for additional osteophytes and posterior soft tissue remnants.  Any remaining meniscus was removed at this time.  Periarticular injection was performed in the meniscal rims and posterior capsule with aspiration performed to ensure no intravascular injection.   The tibia was then exposed and the tibial trial was pinned onto the plateau after confirming appropriate orientation and rotation.  Using the drill bushing the tibia was prepared to the appropriate  drill depth.  Tibial broach impactor was then driven through the punch guide using a mallet.  The femoral trial component was then inserted onto the femur. A trial tibial polyethylene bearing was then placed and the knee was reduced.  The knee achieved full extension with no hyperextension and was found to be balanced in flexion and extension with the trials in place.  The knee was then brought into full extension the patella was everted and held with 2 Kocher clamps.  The articular surface of the patella was then resected with an patella reamer and saw after careful measurement with a caliper.  The patella was then prepared with the drill guide and a trial patella was placed.  The knee was then taken through range of motion and it was found that the patella articulated appropriately with the trochlea and good patellofemoral motion without subluxation.    The correct final components for implantation were confirmed and opened by the circulator nurse.  The knee was irrigated with normal saline via pulsatile lavage to remove any bony debris or soft tissue.  The prepared surface of the tibia was exposed and the tibial component was implanted with good bony contact.  The femoral component was then placed and impacted showing good coverage and a good snug fit.  The patella was then cleared off and the patella compression tool was used to apply the patellar component  with symmetric compression onto the patella.  The tibial component was then irrigated and cleared of any debris and a real polyethylene component was placed and engaged with the locking mechanism.  The knee was then injected with the particular cocktail.  The knee was taken through range of motion and found to be stable in flexion and extension with patellar tracking.  The knee was then irrigated with copious amount of normal saline via pulsatile lavage to remove all loose bodies and other debris.  The knee was then irrigated with surgiphor betadine based  wash and reirrigated with saline.  The tourniquet was then dropped and all bleeding vessels were identified and coagulated.  The arthrotomy was approximated with #1 Vicryl and closed with #1 Stratafix suture.  The knee was brought into slight flexion and the subcutaneous tissues were closed with 0 Vicryl, 2-0 Vicryl and a running subcuticular 4-0 stratafix barbed suture.  Skin was then glued with Dermabond.  A sterile adhesive dressing was then placed along with a sequential compression device to the calf, a Ted stocking, and a cryotherapy cuff.   Sponge, needle, and Lap counts were all correct at the end of the case.   The patient was transferred off of the operating room table to a hospital bed, good pulses were found distally on the operative side.  The patient was transferred to the recovery room in stable condition.

## 2024-05-22 NOTE — H&P (Signed)
 History of Present Illness: The patient is an 65 y.o. male seen in clinic today for history and physical for left total knee arthroplasty with Dr. Lorelle on 05/22/2024. Patient has had years of increasing pain with very little relief over the years with conservative treatment consisting of cortisone injections and gel injections. This pain is 10 out of 10 and has recently become very severe and constant. He feels as if his knee buckles and gives Redinger frequently. His knee gives out daily. His pain and instability is interfering with his quality of life and activities daily living. He has severe valgus deformity with x-ray showing complete loss of joint space in the lateral compartment of the left knee. Patient is seen Dr. Lorelle discussed total knee arthroplasty and agreed and consented the procedure.  Patient is a non-smoker well-controlled diabetic with an A1c of 6.7 and a BMI of 39  Past Medical History: Past Medical History:  Diagnosis Date  Asthma without status asthmaticus (HHS-HCC)  Chronic kidney disease  Heart disease  Hyperlipidemia  Hypertension  Sleep apnea  Stroke (CMS/HHS-HCC)   Past Surgical History: Past Surgical History:  Procedure Laterality Date  ARTHROPLASTY TOTAL KNEE Right 08/17/2017  Dr.menz  HERNIA REPAIR  8 repairs  LAPAROSCOPIC COLON RESECTION  TONSILLECTOMY   Past Family History: Family History  Problem Relation Age of Onset  Myocardial Infarction (Heart attack) Father   Medications: Current Outpatient Medications  Medication Sig Dispense Refill  aspirin  81 MG EC tablet Take 81 mg by mouth once daily  atorvastatin  (LIPITOR) 40 MG tablet Take 40 mg by mouth once daily  fluticasone  propionate (FLONASE ) 50 mcg/actuation nasal spray 2 sprays by Nasal route once daily as needed  hydroCHLOROthiazide  (HYDRODIURIL ) 25 MG tablet Take 25 mg by mouth once daily  lisinopril  (PRINIVIL ,ZESTRIL ) 10 MG tablet Take 10 mg by mouth once daily  loratadine  (CLARITIN ) 10 mg  tablet Take 1 tablet by mouth once daily  metoprolol  succinate (TOPROL -XL) 25 MG XL tablet Take 1 tablet by mouth 2 (two) times daily  MOUNJARO 15 mg/0.5 mL pen injector Inject 15 mg subcutaneously every 7 (seven) days  nitroGLYcerin  (NITROSTAT ) 0.4 MG SL tablet Place under the tongue.  pyridoxine , vitamin B6, (B-6) 100 MG tablet Take 1 tablet by mouth once daily  tadalafiL  (CIALIS ) 5 MG tablet Take 1 tablet by mouth as needed   No current facility-administered medications for this visit.   Allergies: Allergies  Allergen Reactions  Hydromorphone  Hcl Other (See Comments)  Passed out  Sildenafil Citrate Unknown    Visit Vitals: Vitals:  05/16/24 1112  BP: 138/82    Review of Systems:  A comprehensive 14 point ROS was performed, reviewed, and the pertinent orthopaedic findings are documented in the HPI.  Physical Exam: General:  Well developed, well nourished, no apparent distress, normal affect, antalgic gait.  HEENT: Head normocephalic, atraumatic, PERRL.   Abdomen: Soft, non tender, non distended, Bowel sounds present.  Heart: Examination of the heart reveals regular, rate, and rhythm. There is no murmur noted on ascultation. There is a normal apical pulse.  Lungs: Lungs are clear to auscultation. There is no wheeze, rhonchi, or crackles. There is normal expansion of bilateral chest walls.   Comprehensive Knee Exam: Gait Antalgic on the left  Alignment Valgus and hyperextension thrust on the left   Inspection Left  Skin Normal appearance with no obvious deformity. No ecchymosis or erythema.  Soft Tissue No focal soft tissue swelling  Quad Atrophy Mild   Palpation  Left  Tenderness  Joint line parapatellar tenderness to palpation  Crepitus + patellofemoral and tibiofemoral crepitus  Effusion None   Range of Motion Left  Flexion -5-110  Extension 5 degree hyperextension   Ligamentous Exam Left  Lachman 2+  Valgus 0 2+ with endpoint  Valgus 30 Normal   Varus 0 Partially correctable valgus deformity with endpoint  Varus 30 Normal  Anterior Drawer 2+  Posterior Drawer Normal   Meniscal Exam Left  Hyperflexion Test Positive  Hyperextension Test Positive  McMurray's Positive   Neurovascular Left  Quadriceps Strength 5/5  Hamstring Strength 5/5  Hip Abductor Strength 4/5  Distal Motor Normal  Distal Sensory Normal light touch sensation  Distal Pulses Normal    Imaging Studies: Left knee x-rays reviewed by me today from 03/10/2024 show severe degenerative changes of the left knee with complete loss of joint space in the lateral compartment with valgus alignment. Osteophyte seen along the lateral femoral condyle and lateral tibial plateau. Severe patellofemoral osteoarthritis.  Assessment:  ICD-10-CM  1. Primary osteoarthritis of left knee M17.12  Left knee osteoarthritis  Plan: Tashon is a 64 year old male with advanced left knee osteoarthritis. He has severe valgus deformity with complete loss of joint space in the lateral compartment of the left knee with years of increasing pain and instability that has failed conservative treatment consisting of injections, activity modification and bracing. Patient's pain has interfered with his quality of life and activities day living. Risks, benefits, complications of a left total knee arthroplasty have been discussed with the patient.   The hospitalization and post-operative care and rehabilitation were also discussed. The use of perioperative antibiotics and DVT prophylaxis were discussed. The risk, benefits and alternatives to a surgical intervention were discussed at length with the patient. The patient was also advised of risks related to the medical comorbidities and elevated body mass index (BMI). A lengthy discussion took place to review the most common complications including but not limited to: stiffness, loss of function, complex regional pain syndrome, deep vein thrombosis,  pulmonary embolus, heart attack, stroke, infection, wound breakdown, numbness, intraoperative fracture, damage to nerves, tendon,muscles, arteries or other blood vessels, death and other possible complications from anesthesia. The patient was told that we will take steps to minimize these risks by using sterile technique, antibiotics and DVT prophylaxis when appropriate and follow the patient postoperatively in the office setting to monitor progress. The possibility of recurrent pain, no improvement in pain and actual worsening of pain were also discussed with the patient.   All questions answered patient agrees with above plan for left total knee arthroplasty.

## 2024-05-22 NOTE — Transfer of Care (Signed)
 Immediate Anesthesia Transfer of Care Note  Patient: Jacob Marks  Procedure(s) Performed: ARTHROPLASTY, KNEE, TOTAL (Left: Knee)  Patient Location: PACU  Anesthesia Type:Spinal  Level of Consciousness: awake, alert , and oriented  Airway & Oxygen Therapy: Patient Spontanous Breathing  Post-op Assessment: Report given to RN and Post -op Vital signs reviewed and stable  Post vital signs: Reviewed and stable  Last Vitals:  Vitals Value Taken Time  BP 119/68 05/22/24 15:10  Temp    Pulse 82 05/22/24 15:12  Resp 14 05/22/24 15:12  SpO2 96 % 05/22/24 15:12  Vitals shown include unfiled device data.  Last Pain:  Vitals:   05/22/24 1038  PainSc: 0-No pain         Complications: No notable events documented.

## 2024-05-22 NOTE — Interval H&P Note (Signed)
 Patient history and physical updated. Consent reviewed including risks, benefits, and alternatives to surgery. Patient agrees with above plan to proceed with left total knee arthroplasty.

## 2024-05-22 NOTE — Progress Notes (Signed)
 [facility], Dr Myra notified. Order placed for 25 mL of dextrose  50.

## 2024-05-23 ENCOUNTER — Other Ambulatory Visit: Payer: Self-pay

## 2024-05-23 ENCOUNTER — Encounter: Payer: Self-pay | Admitting: Orthopedic Surgery

## 2024-05-23 LAB — BASIC METABOLIC PANEL WITH GFR
Anion gap: 12 (ref 5–15)
BUN: 15 mg/dL (ref 8–23)
CO2: 24 mmol/L (ref 22–32)
Calcium: 9 mg/dL (ref 8.9–10.3)
Chloride: 102 mmol/L (ref 98–111)
Creatinine, Ser: 1.04 mg/dL (ref 0.61–1.24)
GFR, Estimated: >60 mL/min
Glucose, Bld: 185 mg/dL — ABNORMAL HIGH (ref 70–99)
Potassium: 4 mmol/L (ref 3.5–5.1)
Sodium: 138 mmol/L (ref 135–145)

## 2024-05-23 LAB — CBC
HCT: 40.1 % (ref 39.0–52.0)
Hemoglobin: 14 g/dL (ref 13.0–17.0)
MCH: 30.4 pg (ref 26.0–34.0)
MCHC: 34.9 g/dL (ref 30.0–36.0)
MCV: 87 fL (ref 80.0–100.0)
Platelets: 198 10*3/uL (ref 150–400)
RBC: 4.61 MIL/uL (ref 4.22–5.81)
RDW: 13.3 % (ref 11.5–15.5)
WBC: 16.3 10*3/uL — ABNORMAL HIGH (ref 4.0–10.5)
nRBC: 0 % (ref 0.0–0.2)

## 2024-05-23 MED ORDER — SENNA 8.6 MG PO TABS
1.0000 | ORAL_TABLET | Freq: Every day | ORAL | 0 refills | Status: AC
  Filled 2024-05-23: qty 30, 30d supply, fill #0

## 2024-05-23 MED ORDER — TRAMADOL HCL 50 MG PO TABS
50.0000 mg | ORAL_TABLET | Freq: Four times a day (QID) | ORAL | 0 refills | Status: AC | PRN
  Filled 2024-05-23: qty 30, 8d supply, fill #0

## 2024-05-23 MED ORDER — OXYCODONE HCL 5 MG PO TABS
5.0000 mg | ORAL_TABLET | Freq: Four times a day (QID) | ORAL | 0 refills | Status: AC | PRN
  Filled 2024-05-23: qty 40, 7d supply, fill #0

## 2024-05-23 MED ORDER — ONDANSETRON HCL 4 MG PO TABS
4.0000 mg | ORAL_TABLET | Freq: Four times a day (QID) | ORAL | 0 refills | Status: AC | PRN
  Filled 2024-05-23: qty 18, 21d supply, fill #0

## 2024-05-23 MED ORDER — ACETAMINOPHEN 500 MG PO TABS
1000.0000 mg | ORAL_TABLET | Freq: Three times a day (TID) | ORAL | 0 refills | Status: AC
  Filled 2024-05-23: qty 30, 5d supply, fill #0

## 2024-05-23 MED ORDER — LISINOPRIL 20 MG PO TABS
ORAL_TABLET | ORAL | Status: AC
  Filled 2024-05-23: qty 1

## 2024-05-23 MED ORDER — CELECOXIB 100 MG PO CAPS
100.0000 mg | ORAL_CAPSULE | Freq: Two times a day (BID) | ORAL | 0 refills | Status: AC
  Filled 2024-05-23: qty 20, 10d supply, fill #0

## 2024-05-23 MED ORDER — ATORVASTATIN CALCIUM 20 MG PO TABS
ORAL_TABLET | ORAL | Status: AC
  Filled 2024-05-23: qty 2

## 2024-05-23 NOTE — Evaluation (Signed)
 Physical Therapy Evaluation Patient Details Name: Jacob Marks MRN: [id number] DOB: Jul 23, 1960 Today's Date: 05/23/2024  History of Present Illness  Patient is a 64 year old male with left knee osteoarthritis s/p left total knee arthroplasty. History of right total  knee arthroplasty,  Clinical Impression  Patient is agreeable to PT evaluation. He reports he is independent with walking at baseline without a device. He lives with his spouse in a single story home.  Today gait training initiated with reinforcement of sequencing, posture, and positioning of rolling walker. Mild dyspnea with exertion with rest breaks required with hallway ambulation. Stair training completed. Education provided on positioning of left knee to promote knee ROM.  Anticipate patient can return home with family support, and patient is eager to be discharged today. PT will continue to follow if patient does not discharge as anticipated.       If plan is discharge home, recommend the following: Assist for transportation;Help with stairs or ramp for entrance;Assistance with cooking/housework   Can travel by private vehicle        Equipment Recommendations Rolling walker (2 wheels)  Recommendations for Other Services       Functional Status Assessment Patient has had a recent decline in their functional status and demonstrates the ability to make significant improvements in function in a reasonable and predictable amount of time.     Precautions / Restrictions Precautions Precautions: Knee Precaution Booklet Issued: Yes (comment) Restrictions Weight Bearing Restrictions Per Provider Order: Yes LLE Weight Bearing Per Provider Order: Weight bearing as tolerated      Mobility  Bed Mobility                    Transfers Overall transfer level: Needs assistance Equipment used: Rolling walker (2 wheels) Transfers: Sit to/from Stand Sit to Stand: Supervision                 Ambulation/Gait Ambulation/Gait assistance: Supervision Gait Distance (Feet): 100 Feet Assistive device: Rolling walker (2 wheels) Gait Pattern/deviations: Step-to pattern, Step-through pattern, Decreased stance time - left, Decreased stride length, Antalgic Gait velocity: decreased     General Gait Details: reinforcement of posture, positioning of rolling walker, and sequencing of BLE. intermittent rest breaks with mild dyspnea with exertion (presume chronic) that resolves with short rest break. mild nausea reported with no dizziness  Stairs Stairs: Yes Stairs assistance: Supervision, Contact guard assist Stair Management: One rail Right, Forwards Number of Stairs: 4 General stair comments: visual demonstration provided with education on correct sequencing. patient went up/down 4 steps with rail on the right going up to simulate home entry.  Wheelchair Mobility     Tilt Bed    Modified Rankin (Stroke Patients Only)       Balance Overall balance assessment: Needs assistance Sitting-balance support: Feet supported Sitting balance-Leahy Scale: Good     Standing balance support: Bilateral upper extremity supported Standing balance-Leahy Scale: Fair                               Pertinent Vitals/Pain Pain Assessment Pain Assessment: 0-10 Pain Score: 8  (4/10 at rest, 8/10 with walking) Pain Location: R knee, posterior Pain Descriptors / Indicators: Discomfort Pain Intervention(s): Limited activity within patient's tolerance, Monitored during session, Premedicated before session, Repositioned (polar care encouraged)    Home Living Family/patient expects to be discharged to:: Private residence Living Arrangements: Spouse/significant other Available Help at Discharge: Family;Available PRN/intermittently Type  of Home: House Home Access: Stairs to enter Entrance Stairs-Rails: Right Entrance Stairs-Number of Steps: 4   Home Layout: One level Home  Equipment: None (thinks he has a rolling walker in the attic (not accessible))      Prior Function Prior Level of Function : Independent/Modified Independent                     Extremity/Trunk Assessment   Upper Extremity Assessment Upper Extremity Assessment: Overall WFL for tasks assessed    Lower Extremity Assessment Lower Extremity Assessment: LLE deficits/detail LLE Deficits / Details: can activate hip/knee/ankle movement. weight bearing without knee buckling       Communication   Communication Communication: Impaired Factors Affecting Communication: Hearing impaired    Cognition Arousal: Alert Behavior During Therapy: WFL for tasks assessed/performed   PT - Cognitive impairments: No apparent impairments                         Following commands: Intact       Cueing Cueing Techniques: Verbal cues, Visual cues     General Comments General comments (skin integrity, edema, etc.): education on positioning of LLE to promote knee ROM, including avoiding pillow behind the knee    Exercises Total Joint Exercises Goniometric ROM: L knee flexion 92 degrees   Assessment/Plan    PT Assessment Patient needs continued PT services  PT Problem List Decreased activity tolerance;Decreased strength;Decreased range of motion;Decreased balance;Decreased mobility;Decreased knowledge of precautions;Pain       PT Treatment Interventions DME instruction;Stair training;Gait training;Functional mobility training;Therapeutic activities;Therapeutic exercise;Balance training;Cognitive remediation;Patient/family education    PT Goals (Current goals can be found in the Care Plan section)  Acute Rehab PT Goals Patient Stated Goal: to go home today PT Goal Formulation: With patient Time For Goal Achievement: 06/06/24 Potential to Achieve Goals: Poor    Frequency BID     Co-evaluation               AM-PAC PT 6 Clicks Mobility  Outcome Measure Help  needed turning from your back to your side while in a flat bed without using bedrails?: None Help needed moving from lying on your back to sitting on the side of a flat bed without using bedrails?: A Little Help needed moving to and from a bed to a chair (including a wheelchair)?: A Little Help needed standing up from a chair using your arms (e.g., wheelchair or bedside chair)?: A Little Help needed to walk in hospital room?: A Little Help needed climbing 3-5 steps with a railing? : A Little 6 Click Score: 19    End of Session   Activity Tolerance: Patient tolerated treatment well;Patient limited by fatigue Patient left: in chair;with call bell/phone within reach (polar care re-applied) Nurse Communication: Mobility status PT Visit Diagnosis: Difficulty in walking, not elsewhere classified (R26.2);Other abnormalities of gait and mobility (R26.89)    Time: 9147-9084 PT Time Calculation (min) (ACUTE ONLY): 23 min   Charges:   PT Evaluation $PT Eval Low Complexity: 1 Low PT Treatments $Gait Training: 8-22 mins PT General Charges $$ ACUTE PT VISIT: 1 Visit    Randine Essex, PT, MPT   Randine LULLA Essex 05/23/2024, 9:33 AM

## 2024-05-23 NOTE — Discharge Instructions (Signed)

## 2024-05-23 NOTE — Anesthesia Postprocedure Evaluation (Signed)
"   Anesthesia Post Note  Patient: Bernhardt Riemenschneider Bougie  Procedure(s) Performed: ARTHROPLASTY, KNEE, TOTAL (Left: Knee)  Patient location during evaluation: Short Stay Anesthesia Type: Spinal Level of consciousness: oriented and awake and alert Pain management: pain level controlled Vital Signs Assessment: post-procedure vital signs reviewed and stable Respiratory status: spontaneous breathing, respiratory function stable and patient connected to nasal cannula oxygen Cardiovascular status: blood pressure returned to baseline and stable Postop Assessment: no headache, no backache and no apparent nausea or vomiting Anesthetic complications: no   No notable events documented.   Last Vitals:  Vitals:   05/23/24 0410 05/23/24 0817  BP: 123/65 130/63  Pulse: 92 82  Resp: 18 15  Temp: 36.6 C 36.8 C  SpO2: 95% 96%    Last Pain:  Vitals:   05/23/24 0817  TempSrc: Oral  PainSc: 7                  Pranay Hilbun,  Delon HERO      "

## 2024-05-23 NOTE — Progress Notes (Signed)
 Patient is not able to walk the distance required to go the bathroom, or he is unable to safely negotiate stairs required to access the bathroom.  A 3in1 BSC will alleviate this problem.     Patient has mobility impairment for daily activities. A rolling walker will resolve this and the patient is safe to use it    T. Medford Amber, PA-C [facility]

## 2024-05-23 NOTE — Plan of Care (Signed)
" °  Problem: Education: Goal: Knowledge of the prescribed therapeutic regimen will improve Outcome: Progressing   Problem: Bowel/Gastric: Goal: Gastrointestinal status for postoperative course will improve Outcome: Progressing   Problem: Cardiac: Goal: Ability to maintain an adequate cardiac output Outcome: Progressing   Problem: Clinical Measurements: Goal: Ability to maintain clinical measurements within normal limits Outcome: Progressing   Problem: Respiratory: Goal: Will regain and/or maintain adequate ventilation Outcome: Progressing   Problem: Skin Integrity: Goal: Demonstrates signs of wound healing without infection Outcome: Progressing   "

## 2024-05-23 NOTE — Plan of Care (Signed)
  Problem: Clinical Measurements: Goal: Postoperative complications will be avoided or minimized Outcome: Progressing
